# Patient Record
Sex: Male | Born: 1945 | Race: White | Hispanic: No
Health system: Southern US, Academic
[De-identification: ages and names within clinical notes are randomized; demographics above are authoritative.]

## PROBLEM LIST (undated history)

## (undated) ENCOUNTER — Ambulatory Visit

## (undated) ENCOUNTER — Ambulatory Visit: Payer: MEDICARE | Attending: Rheumatology | Primary: Rheumatology

## (undated) ENCOUNTER — Telehealth

## (undated) ENCOUNTER — Encounter

## (undated) ENCOUNTER — Encounter
Attending: Student in an Organized Health Care Education/Training Program | Primary: Student in an Organized Health Care Education/Training Program

## (undated) ENCOUNTER — Encounter: Attending: Dermatology | Primary: Dermatology

## (undated) ENCOUNTER — Ambulatory Visit: Payer: MEDICARE

## (undated) ENCOUNTER — Telehealth
Attending: Student in an Organized Health Care Education/Training Program | Primary: Student in an Organized Health Care Education/Training Program

## (undated) DIAGNOSIS — C4491 Basal cell carcinoma of skin, unspecified: Secondary | ICD-10-CM

## (undated) DIAGNOSIS — J45909 Unspecified asthma, uncomplicated: Secondary | ICD-10-CM

## (undated) DIAGNOSIS — K635 Polyp of colon: Secondary | ICD-10-CM

## (undated) DIAGNOSIS — R51 Headache: Secondary | ICD-10-CM

## (undated) DIAGNOSIS — M1990 Unspecified osteoarthritis, unspecified site: Secondary | ICD-10-CM

## (undated) DIAGNOSIS — R030 Elevated blood-pressure reading, without diagnosis of hypertension: Secondary | ICD-10-CM

## (undated) DIAGNOSIS — R112 Nausea with vomiting, unspecified: Secondary | ICD-10-CM

## (undated) DIAGNOSIS — B019 Varicella without complication: Secondary | ICD-10-CM

## (undated) DIAGNOSIS — Z9889 Other specified postprocedural states: Secondary | ICD-10-CM

## (undated) DIAGNOSIS — E7800 Pure hypercholesterolemia, unspecified: Secondary | ICD-10-CM

## (undated) DIAGNOSIS — K219 Gastro-esophageal reflux disease without esophagitis: Secondary | ICD-10-CM

## (undated) DIAGNOSIS — T7840XA Allergy, unspecified, initial encounter: Secondary | ICD-10-CM

## (undated) HISTORY — DX: Unspecified osteoarthritis, unspecified site: M19.90

## (undated) HISTORY — DX: Elevated blood-pressure reading, without diagnosis of hypertension: R03.0

## (undated) HISTORY — PX: KNEE ARTHROSCOPY: SHX127

## (undated) HISTORY — DX: Allergy, unspecified, initial encounter: T78.40XA

## (undated) HISTORY — DX: Unspecified asthma, uncomplicated: J45.909

## (undated) HISTORY — DX: Basal cell carcinoma of skin, unspecified: C44.91

## (undated) HISTORY — DX: Varicella without complication: B01.9

---
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
## 1970-01-13 HISTORY — PX: APPENDECTOMY: SHX54

## 2006-01-13 HISTORY — PX: LUMBAR DISC SURGERY: SHX700

## 2006-04-30 ENCOUNTER — Ambulatory Visit: Payer: Self-pay

## 2006-05-22 ENCOUNTER — Ambulatory Visit (HOSPITAL_COMMUNITY): Admission: RE | Admit: 2006-05-22 | Discharge: 2006-05-23 | Payer: Self-pay | Admitting: Neurosurgery

## 2007-03-31 ENCOUNTER — Ambulatory Visit: Payer: Self-pay | Admitting: Anesthesiology

## 2007-04-21 ENCOUNTER — Ambulatory Visit (HOSPITAL_BASED_OUTPATIENT_CLINIC_OR_DEPARTMENT_OTHER): Admission: RE | Admit: 2007-04-21 | Discharge: 2007-04-21 | Payer: Self-pay | Admitting: Orthopedic Surgery

## 2009-01-13 HISTORY — PX: COLONOSCOPY: SHX174

## 2009-07-22 ENCOUNTER — Ambulatory Visit: Payer: Self-pay | Admitting: Family Medicine

## 2010-02-13 NOTE — Assessment & Plan Note (Signed)
Summary: NECK AND SHOULDER PAIN/JBB   Vital Signs:  Patient Profile:   65 Years Old Male CC:      pain in neck and shoulder area Height:     70 inches Weight:      225 pounds BMI:     32.40 O2 Sat:      97 % O2 treatment:    Room Air Pulse rate:   64 / minute BP sitting:   108 / 60  (right arm)                  Current Allergies (reviewed today): No known allergies History of Present Illness History from: patient Reason for visit: see chief complaint Chief Complaint: pain in neck and shoulder area History of Present Illness: He has had pain in neck and shouldersx 2 days, and not sure why-just turned and somethimg seemed to pul, and hurt. No numbness or pain going down arm.    REVIEW OF SYSTEMS Constitutional Symptoms      Denies fever, chills, night sweats, weight loss, weight gain, and fatigue.  Eyes       Complains of glasses and contact lenses.      Denies change in vision, eye pain, eye discharge, and eye surgery. Ear/Nose/Throat/Mouth       Denies hearing loss/aids, change in hearing, ear pain, ear discharge, dizziness, frequent runny nose, frequent nose bleeds, sinus problems, sore throat, hoarseness, and tooth pain or bleeding.  Respiratory       Complains of dry cough.      Denies productive cough, wheezing, shortness of breath, asthma, bronchitis, and emphysema/COPD.  Cardiovascular       Denies murmurs, chest pain, and tires easily with exhertion.    Gastrointestinal       Denies stomach pain, nausea/vomiting, diarrhea, constipation, blood in bowel movements, and indigestion. Genitourniary       Denies painful urination, kidney stones, and loss of urinary control. Neurological       Denies paralysis, seizures, and fainting/blackouts. Musculoskeletal       Complains of muscle pain, joint pain, joint stiffness, and decreased range of motion.      Denies redness, swelling, muscle weakness, and gout.  Skin       Denies bruising, unusual mles/lumps or sores,  and hair/skin or nail changes.  Psych       Denies mood changes, temper/anger issues, anxiety/stress, speech problems, depression, and sleep problems.  Past History:  Past Medical History: Unremarkable  Past Surgical History: Back surgery- 2007 Knee surgery- 2007  Current Medications (verified): 1)  None  Allergies (verified): No Known Drug Allergies   Social History: 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 grown children Never Smoked Alcohol use- one beer a day Drug use-no Regular exercise-no Smoking Status:  never Drug Use:  no Does Patient Exercise:  no Physical Exam General appearance: well developed, well nourished, no acute distress Head: normocephalic, atraumatic Eyes: conjunctivae and lids normal Pupils: equal, round, reactive to light Ears: normal, no lesions or deformities Nasal: mucosa pink, nonedematous, no septal deviation, turbinates normal Neck: neck tender on right side, to deep palpation. Trapezius tight. Decreased ROM, esp to right and right lateral bending. Chest/Lungs: no rales, wheezes, or rhonchi bilateral, breath sounds equal without effort Heart: regular rate and  rhythm, no murmur Extremities: normal extremities Neurological: grossly intact and non-focal Skin: no obvious rashes or lesions MSE: oriented to time, place, and person Assessment New Problems: CERVICAL STRAIN (ICD-847.0)   Patient  Education: May also try gentle massage, may use Ben-Gay or similar rub. Extra caution with driving-gets up @ 1 am, to drive about 3 am.  Plan New Medications/Changes: FLEXERIL 10 MG TABS (CYCLOBENZAPRINE HCL) one tab by mouth every 8 hours as needed for spasm  #30 x 0, 07/22/2009, Adella Hare LPN NAPROXEN 413 MG TABS (NAPROXEN) one tab by mouth two times a day  #30 x 0, 07/22/2009, Adella Hare LPN  New Orders: New Patient Level III [id number]  The patient and/or caregiver has been counseled thoroughly with regard to medications  prescribed including dosage, schedule, interactions, rationale for use, and possible side effects and they verbalize understanding.  Diagnoses and expected course of recovery discussed and will return if not improved as expected or if the condition worsens. Patient and/or caregiver verbalized understanding.  Prescriptions: FLEXERIL 10 MG TABS (CYCLOBENZAPRINE HCL) one tab by mouth every 8 hours as needed for spasm  #30 x 0   Entered by:   Adella Hare LPN   Authorized by:   Kathrynn Running MD   Signed by:   Adella Hare LPN on 02/72/5366   Method used:   Electronically to        [facility]. [address] [phone]* (retail)       [address] [place], [place]  [address]       Ph: [phone]       Fax: [phone]   RxID:   [id number] NAPROXEN 500 MG TABS (NAPROXEN) one tab by mouth two times a day  #30 x 0   Entered by:   Adella Hare LPN   Authorized by:   Kathrynn Running MD   Signed by:   Adella Hare LPN on 25/42/7062   Method used:   Electronically to        [facility]. [address] [phone]* (retail)       [address] [place], [place]  [id number]       Ph: [phone]       Fax: [phone]   RxID:   [id number]   Orders Added: 1)  New Patient Level III [phone]   The risks, benefits and possible side effects were clearly explained and discussed with the patient.  The patient verbalized clear understanding.  The patient was given instructions to return if symptoms don't improve, worsen or new changes develop.  If it is not during clinic hours and the patient cannot get back to this clinic then the patient was told to seek medical care at an available urgent care or emergency department.  The patient verbalized understanding.    The patient was informed that there is no on-call provider or services available at this clinic during off-hours (when the clinic is closed).  If the patient developed a problem or concern that required immediate attention, the patient was  advised to go the the nearest available urgent care or emergency department for medical care.  The patient verbalized understanding.    It was clearly explained to the patient that this [facility] is not intended to be a primary care clinic.  The patient is always better served by the continuity of care and the provider/patient relationships developed with their dedicated primary care provider.  The patient was told to be sure to follow up as soon as possible with their primary care provider to discuss treatments received and to receive further examination and testing.  The patient verbalized understanding.  The will f/u with PCP ASAP.  I have reviewed the above medical office visit documention, including diagnoses, history, medications, clinical lists, orders and plan of care.   Rodney Langton, MD, FAAFP  July 22, 2009

## 2010-02-20 ENCOUNTER — Encounter: Payer: Self-pay | Admitting: Family Medicine

## 2010-02-20 ENCOUNTER — Ambulatory Visit: Payer: BC Managed Care – PPO | Admitting: Family Medicine

## 2010-02-20 DIAGNOSIS — J309 Allergic rhinitis, unspecified: Secondary | ICD-10-CM | POA: Insufficient documentation

## 2010-02-20 DIAGNOSIS — J019 Acute sinusitis, unspecified: Secondary | ICD-10-CM

## 2010-02-20 DIAGNOSIS — J45909 Unspecified asthma, uncomplicated: Secondary | ICD-10-CM | POA: Insufficient documentation

## 2010-02-28 NOTE — Assessment & Plan Note (Signed)
Summary: cold,cough,chills/jbb   Vital Signs:  Patient Profile:   65 Years Old Male CC:      Cold & URI symptoms Height:     69 inches Weight:      226 pounds BMI:     33.50 O2 Sat:      96 % O2 treatment:    Room Air Temp:     97.3 degrees F oral Pulse rate:   70 / minute Pulse rhythm:   regular Resp:     20 per minute BP sitting:   136 / 86  (right arm)  Pt. in pain?   no  Vitals Entered By: Levonne Spiller EMT-P (February 20, 2010 2:22 PM)              Is Patient Diabetic? No  Does patient need assistance? Functional Status Self care Ambulation Normal      Current Allergies (reviewed today): No known allergies History of Present Illness History from: patient Chief Complaint: Cold & URI symptoms History of Present Illness: The patient is presenting today reporting 3 weeks of cough, congestion, sinus drainage, and chills that started yesterday.  The patient has been wheezing on occasion and has a history of asthma.  The patient reports no fever.  The patient denies n/v/d.  Yellow sputum production reported.  Runny nose and sneezing has been persistent and cough worse at night.   REVIEW OF SYSTEMS Constitutional Symptoms       Complains of chills.     Denies fever, night sweats, weight loss, weight gain, and fatigue.  Eyes       Denies change in vision, eye pain, eye discharge, glasses, contact lenses, and eye surgery. Ear/Nose/Throat/Mouth       Denies hearing loss/aids, change in hearing, ear pain, ear discharge, dizziness, frequent runny nose, frequent nose bleeds, sinus problems, sore throat, hoarseness, and tooth pain or bleeding.  Respiratory       Complains of productive cough, wheezing, and asthma.      Denies dry cough, shortness of breath, bronchitis, and emphysema/COPD.      Comments: Yellow colored sputum Cardiovascular       Denies murmurs, chest pain, and tires easily with exhertion.    Gastrointestinal       Denies stomach pain, nausea/vomiting,  diarrhea, constipation, blood in bowel movements, and indigestion. Genitourniary       Denies painful urination, blood or discharge from penis, kidney stones, and loss of urinary control. Neurological       Denies paralysis, seizures, and fainting/blackouts. Musculoskeletal       Denies muscle pain, joint pain, joint stiffness, decreased range of motion, redness, swelling, muscle weakness, and gout.  Skin       Denies bruising, unusual mles/lumps or sores, and hair/skin or nail changes.  Psych       Denies mood changes, temper/anger issues, anxiety/stress, speech problems, depression, and sleep problems.  Past History:  Family History: Last updated: 02/20/2010 Father  - Heart Disease - Deceased age 61.  Social History: Last updated: 07/22/2009 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 grown children Never Smoked Alcohol use- one beer a day Drug use-no Regular exercise-no  Risk Factors: Exercise: no (07/22/2009)  Risk Factors: Smoking Status: never (07/22/2009)  Past Medical History: Asthma  Past Surgical History: Reviewed history from 07/22/2009 and no changes required. Back surgery- 2007 Knee surgery- 2007  Family History: Father  - Heart Disease - Deceased age 30.  Social History: Reviewed history from  07/22/2009 and no changes required. 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 grown children Never Smoked Alcohol use- one beer a day Drug use-no Regular exercise-no Physical Exam General appearance: well developed, well nourished, no acute distress Head: normocephalic, atraumatic Eyes: conjunctivae and lids normal Pupils: equal, round, reactive to light Ears: normal, no lesions or deformities Nasal: swollen red turbinates with congestion Oral/Pharynx: tongue normal, posterior pharynx without erythema or exudate Neck: neck supple,  trachea midline, no masses Chest/Lungs: no rales, wheezes, or rhonchi bilateral, breath sounds equal  without effort Heart: regular rate and  rhythm, no murmur Abdomen: soft, non-tender without obvious organomegaly Extremities: normal extremities Neurological: grossly intact and non-focal Skin: no obvious rashes or lesions MSE: oriented to time, place, and person Assessment Problems:   CERVICAL STRAIN (ICD-847.0)  Assessed ASTHMA as deteriorated - Clanford Johnson MD New Problems: ACUTE SINUSITIS, UNSPECIFIED (ICD-461.9) ALLERGIC RHINITIS CAUSE UNSPECIFIED (ICD-477.9) ASTHMA (ICD-493.90)   Patient Education: Patient and/or caregiver instructed in the following: rest, fluids. The risks, benefits and possible side effects were clearly explained and discussed with the patient.  The patient verbalized clear understanding.  The patient was given instructions to return if symptoms don't improve, worsen or new changes develop.  If it is not during clinic hours and the patient cannot get back to this clinic then the patient was told to seek medical care at an available urgent care or emergency department.  The patient verbalized understanding.   Demonstrates willingness to comply.  Plan New Medications/Changes: FLUTICASONE PROPIONATE 50 MCG/ACT SUSP (FLUTICASONE PROPIONATE) 2 sprays per nostril once daily  #1 x 0, 02/20/2010, Clanford Johnson MD AZITHROMYCIN 250 MG TABS (AZITHROMYCIN) take 2 tabs by mouth on day 1, then take 1 tab by mouth daily until completed  #6 x 0, 02/20/2010, Clanford Johnson MD CETIRIZINE HCL 10 MG TABS (CETIRIZINE HCL) take 1 by mouth daily  #30 x 0, 02/20/2010, Clanford Johnson MD  Follow Up: Follow up in 2-3 days if no improvement, Follow up on an as needed basis, Follow up with Primary Physician  The patient and/or caregiver has been counseled thoroughly with regard to medications prescribed including dosage, schedule, interactions, rationale for use, and possible side effects and they verbalize understanding.  Diagnoses and expected course of recovery discussed and  will return if not improved as expected or if the condition worsens. Patient and/or caregiver verbalized understanding.  Prescriptions: FLUTICASONE PROPIONATE 50 MCG/ACT SUSP (FLUTICASONE PROPIONATE) 2 sprays per nostril once daily  #1 x 0   Entered and Authorized by:   Standley Dakins MD   Signed by:   Standley Dakins MD on 02/20/2010   Method used:   Electronically to        [facility]. [address] [phone]* (retail)       [address] [place], [place]  [address]       Ph: [phone]       Fax: [phone]   RxID:   [id number] AZITHROMYCIN 250 MG TABS (AZITHROMYCIN) take 2 tabs by mouth on day 1, then take 1 tab by mouth daily until completed  #6 x 0   Entered and Authorized by:   Standley Dakins MD   Signed by:   Standley Dakins MD on 02/20/2010   Method used:   Electronically to        [facility]. [facility] [phone]* (retail)       [address]  [place], [place]  [address]       Ph: [phone]       Fax: [phone]   RxID:   [id number] CETIRIZINE HCL 10 MG TABS (CETIRIZINE HCL) take 1 by mouth daily  #30 x 0   Entered and Authorized by:   Standley Dakins MD   Signed by:   Standley Dakins MD on 02/20/2010   Method used:   Electronically to        [facility]. [address] [phone]* (retail)       [address] [place], [place]  [address]       Ph: [phone]       Fax: [phone]   RxID:   [id number]   Patient Instructions: 1)  Go to the pharmacy and pick up your prescription (s).  It may take up to 30 mins for electronic prescriptions to be delivered to the pharmacy.  Please call if your pharmacy has not received your prescriptions after 30 minutes.   2)  Return or go to the ER if no improvement or symptoms getting worse.   3)  Take your antibiotic as prescribed until ALL of it is gone, but stop if you develop a rash or swelling and contact our office as soon as possible. 4)  Acute sinusitis symptoms for less than 10 days are  not helped by antibiotics.Use warm moist compresses, and over the counter decongestants ( only as directed). Call if no improvement in 5-7 days, sooner if increasing pain, fever, or new symptoms. 5)  Acute bronchitis symptoms for less than 10 days are not helped by antibiotics. take over the counter cough medications. call if no improvment in  5-7 days, sooner if increasing cough, fever, or new symptoms( shortness of breath, chest pain). 6)  The patient was informed that there is no on-call provider or services available at this clinic during off-hours (when the clinic is closed).  If the patient developed a problem or concern that required immediate attention, the patient was advised to go the the nearest available urgent care or emergency department for medical care.  The patient verbalized understanding.

## 2010-03-20 ENCOUNTER — Ambulatory Visit (HOSPITAL_BASED_OUTPATIENT_CLINIC_OR_DEPARTMENT_OTHER)
Admission: RE | Admit: 2010-03-20 | Discharge: 2010-03-20 | Disposition: A | Discharge status: Home / Self Care | Payer: BC Managed Care – PPO | Source: Ambulatory Visit | Attending: Orthopedic Surgery | Admitting: Orthopedic Surgery

## 2010-03-20 DIAGNOSIS — M23319 Other meniscus derangements, anterior horn of medial meniscus, unspecified knee: Secondary | ICD-10-CM | POA: Insufficient documentation

## 2010-03-20 DIAGNOSIS — J45909 Unspecified asthma, uncomplicated: Secondary | ICD-10-CM | POA: Insufficient documentation

## 2010-03-20 DIAGNOSIS — M2240 Chondromalacia patellae, unspecified knee: Secondary | ICD-10-CM | POA: Insufficient documentation

## 2010-03-20 DIAGNOSIS — M23329 Other meniscus derangements, posterior horn of medial meniscus, unspecified knee: Secondary | ICD-10-CM | POA: Insufficient documentation

## 2010-03-20 DIAGNOSIS — M2390 Unspecified internal derangement of unspecified knee: Secondary | ICD-10-CM | POA: Insufficient documentation

## 2010-03-20 LAB — POCT HEMOGLOBIN-HEMACUE: Hemoglobin: 15.1 g/dL (ref 13.0–17.0)

## 2010-04-03 NOTE — Op Note (Signed)
NAME:  Nathan Ramirez NO.:  [id number]  MEDICAL RECORD NO.:  [id number]         PATIENT TYPE:  HAMB  LOCATION:                               FACILITY:  [facility]  PHYSICIAN:  Ollen Gross, M.D.    DATE OF BIRTH:  05-Aug-1945  DATE OF PROCEDURE:  03/20/2010 DATE OF DISCHARGE:                              OPERATIVE REPORT   PREOPERATIVE DIAGNOSIS:  Left knee medial meniscal tear and chondral defect.  POSTOPERATIVE DIAGNOSIS:  Left knee medial meniscal tear and chondral defect.  PROCEDURE:  Left knee arthroscopy with meniscal debridement and chondroplasty.  SURGEON:  Ollen Gross, M.D.  ASSISTANT:  None.  ANESTHESIA:  General.  ESTIMATED BLOOD LOSS:  Minimal.  DRAINS:  None.  COMPLICATIONS:  None.  CONDITION:  Stable to Recovery.  BRIEF CLINICAL NOTE:  Nathan Ramirez is 65 year old male with several-month history of current effusions and left knee pain mechanical symptoms.  He had one aspiration injection, but the fluid came right back.  An MRI showed meniscal tear and chondral defect.  He presents now for arthroscopic debridement given failure of nonoperative management.  PROCEDURE IN DETAIL:  After successful administration of general anesthetic, a tourniquet was placed high on his left thigh and his left lower extremity was prepped and draped in the usual sterile fashion. Standard superomedial and inferolateral incisions were made.  Inflow cannula was passed superomedial.  Camera was passed inferolateral. Arthroscopic visualization proceeds.  Undersurface of the patella and trochlea showed some grade 1 and 2 change, but no full-thickness defects and no evidence of unstable cartilage.  Mediolateral gutters were visualized.  There were no loose bodies.  Flexion and valgus force was applied to the knee and the medial compartment centered.  He has got a real significant tear at the body, posterior horn and small part of the anterior horn of the medial  meniscus.  There is also unstable cartilage undersurface of the femur with some exposed bone on the medial femoral condyle.  A spinal needle was used to localize the inferomedial portal with small incision made and dilator placed.  The meniscus was debrided back to a stable base with baskets and a 4.2 mm shaver and then sealed off with the ArthroCare device.  I used the shaver then to debride the unstable cartilage undersurface of the medial femoral condyle.  The lesions about 2 x 2 cm.  Once debrided down to bony base, I abraded the bone to trying to get some fibrocartilage formation.  The edges were also debrided to make them smooth edges.  The rest of the medial femoral condyle had some chondromalacia, but no unstable cartilage.  The medial tibial plateau had mild chondromalacia.  Intercondylar notch was visualized.  The ACL was normal.  Lateral compartment was entered and there is very mild chondromalacia in the lateral tibial plateau, but no unstable cartilage.  The central groove of the trochlea has some unstable cartilage, which was debrided back to a stable cartilaginous base with stable edges.  Joints were again inspected.  No other tears, loose bodies or defects were noted.  The arthroscopic equipment is then removed from the inferior  portals, which were closed with interrupted 4- 0 nylon.  A 20 cc of 0.25% Marcaine with epi injected through the inflow cannula and that is removed and that portal closed with nylon. Incisions were cleaned and dried.  A bulky sterile dressing was applied. He was then awakened and transported to Recovery in stable condition.     Ollen Gross, M.D.     FA/MEDQ  D:  03/20/2010  T:  03/20/2010  Job:  [id number]  Electronically Signed by Ollen Gross M.D. on 04/03/2010 08:17:31 AM

## 2010-05-28 NOTE — Op Note (Signed)
NAME:  Nathan Ramirez, Nathan Ramirez NO.:  [id number]   MEDICAL RECORD NO.:  [id number]          PATIENT TYPE:  AMB   LOCATION:  SDS                          FACILITY:  [facility]   PHYSICIAN:  Hilda Lias, M.D.   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   POSTOPERATIVE DIAGNOSES:  Left L3-L4 herniated disc with a free  fragment, lumbar stenosis, L3-L4 and L4-L5.   PROCEDURE:  Left L3-L4 laminotomy, discectomy, removal of fragment  compromising the L4 nerve root, microscope.   SURGEON:  Hilda Lias, M.D.   ASSISTANT:  Danae Orleans. Venetia Maxon, M.D.   CLINICAL HISTORY:  The patient was seen by me in the office of back and  left leg pain.  The patient failed conservative treatment including  epidural injection.  X-ray showed lumbar stenosis, bilateral, and  herniated disc with a fragment going to the left side.  Surgery was  advised and the risks were explained to him in the history and physical.   DESCRIPTION OF PROCEDURE:  Mr. More was taken to the OR where he was  positioned in a prone manner.  The wound was cleaned with DuraPrep.  A  midline incision from L3 to L4 was made.  X-rays showed that the level  was at the level of L3.  From then on we identified L3-L4 and with the  drill, we removed the lower lamina of L3 and the upper of L4.  A thick  yellow ligament was also excised.  With the microscope, we identified  the thecal sac.  Indeed, it was difficult to retract secondary to  adhesion but the nerve root was adherent to the disc.  An incision was  made and we entered the disc space which was almost completely empty.  The patient had quite a bit of degenerative disc.  There was a large  osteophyte which was removed.  From then on, we worked our way down to  the L4 nerve root  and there was a large disc with two fragments  compromising all the way down to the foramen.  Removal was achieved.  At  the end, we had good decompression of the thecal sac as well as the L3  and L4 nerve root.  Valsalva maneuver was negative.  Hemostasis was done  with the bipolar.  Depo-Medrol and fentanyl were left in the epidural  space and the wound was closed with Vicryl and Steri-Strips.           ______________________________  Hilda Lias, M.D.     EB/MEDQ  D:  05/22/2006  T:  05/23/2006  Job:  [id number]

## 2010-05-28 NOTE — Op Note (Signed)
NAME:  Nathan Ramirez, Nathan Ramirez NO.:  [id number]   MEDICAL RECORD NO.:  [id number]          PATIENT TYPE:  AMB   LOCATION:  [facility]                         FACILITY:  [facility]   PHYSICIAN:  Ollen Gross, M.D.    DATE OF BIRTH:  06-04-1945   DATE OF PROCEDURE:  04/21/2007  DATE OF DISCHARGE:                               OPERATIVE REPORT   PREOPERATIVE DIAGNOSIS:  Right knee medial meniscal tear and chondral  defect medial femoral condyle.   POSTOPERATIVE DIAGNOSIS:  Right knee medial meniscal tear and chondral  defect medial femoral condyle.   PROCEDURE:  Right knee arthroscopy with medial meniscal debridement and  chondroplasty.   SURGEON:  Ollen Gross, M.D.   ASSISTANT:  None.   ANESTHESIA:  General anesthesia.   ESTIMATED BLOOD LOSS:  Minimal.   DRAINS:  None.   COMPLICATIONS:  None.   CONDITION:  Stable to recovery room.   BRIEF CLINICAL NOTE:  Nathan Ramirez is a 65 year old male with a several  month history of significant right knee pain and mechanical symptoms.  Exam and history suggest a medial meniscal tear confirmed by MRI.  He  presents now for arthroscopy and debridement.   DESCRIPTION OF PROCEDURE:  After successful administration of general  anesthetic, a tourniquet is placed high on the right thigh and right  lower extremity prepped and draped in the usual sterile fashion.  Standard superomedial and inferolateral portal incisions made and inflow  cannula passed superomedial and camera passed inferolateral.  Arthroscopic visualization proceeds.  Undersurface of the patella looks  normal. The trochlea has some grade III change centrally but no full  thickness cartilage defect.  The medial and lateral gutters are  visualized.  There are no loose bodies.  Flexion and valgus force is  applied to the knee and the medial compartment is entered.  There is  evidence of a medial meniscal tear throughout the body and posterior  horn as well as a very large  chondral defect off of the medial femoral  condyle, posteromedial.  This is about 2 x 2 cm.  It looks like some of  it is old but there is also additional cartilage delaminating from the  bone.  A spinal needle was used to localize the inferomedial portals,  long incision made and dilator placed.  We then debrided the meniscus  back to a stable base with baskets and a 4.2 mm shaver and sealed it off  with the ArthroCare device.  The chondral defect on the medial femoral  condyle was debrided back to a bony base which is abraded and then  debrided back to stable cartilaginous edges.  Total size about 2 x 2 cm.  The rest of the condyle looks normal.  Medial tibial plateau looks  normal.  Intercondylar notch is visualized.  The ACL is normal.  Lateral  compartment is entered and is normal.  I then addressed the  patellofemoral compartment again, debrided that small trochlear chondral  defect back to a stable cartilaginous base.  The joint is further  inspected and there are no other loose bodies, tears or  defects noted.  The arthroscopic equipment is then removed from the inferior portals  which are closed with interrupted 4-0 nylon.  There are 20 mL of 25%  Marcaine with epinephrine injected through the inflow cannula then that  is removed and that portal closed with nylon.  The incisions are cleaned  and dried and a bulky sterile dressing applied.  He is then awakened and  transported to recovery in stable condition.      Ollen Gross, M.D.  Electronically Signed     FA/MEDQ  D:  04/21/2007  T:  04/21/2007  Job:  [id number]

## 2010-10-08 LAB — POCT HEMOGLOBIN-HEMACUE
Hemoglobin: 16.2
Operator id: 268271

## 2010-10-15 LAB — HM COLONOSCOPY: HM Colonoscopy: NORMAL

## 2011-10-15 ENCOUNTER — Encounter: Payer: Self-pay | Admitting: Internal Medicine

## 2011-10-15 ENCOUNTER — Ambulatory Visit (INDEPENDENT_AMBULATORY_CARE_PROVIDER_SITE_OTHER): Payer: BC Managed Care – PPO | Admitting: Internal Medicine

## 2011-10-15 VITALS — BP 122/72 | HR 62 | Temp 97.8°F | Ht 70.0 in | Wt 225.0 lb

## 2011-10-15 DIAGNOSIS — I10 Essential (primary) hypertension: Secondary | ICD-10-CM

## 2011-10-15 DIAGNOSIS — R5381 Other malaise: Secondary | ICD-10-CM

## 2011-10-15 DIAGNOSIS — R5383 Other fatigue: Secondary | ICD-10-CM

## 2011-10-15 DIAGNOSIS — R002 Palpitations: Secondary | ICD-10-CM

## 2011-10-15 DIAGNOSIS — Z125 Encounter for screening for malignant neoplasm of prostate: Secondary | ICD-10-CM

## 2011-10-15 DIAGNOSIS — Z13220 Encounter for screening for lipoid disorders: Secondary | ICD-10-CM

## 2011-10-15 LAB — LIPID PANEL
Cholesterol: 234 mg/dL — ABNORMAL HIGH (ref 0–200)
HDL: 50.2 mg/dL (ref >39.00)
Total CHOL/HDL Ratio: 5
Triglycerides: 86 mg/dL (ref 0.0–149.0)
VLDL: 17.2 mg/dL (ref 0.0–40.0)

## 2011-10-15 LAB — COMPREHENSIVE METABOLIC PANEL
ALT: 48 U/L (ref 0–53)
AST: 34 U/L (ref 0–37)
Albumin: 4.3 g/dL (ref 3.5–5.2)
Alkaline Phosphatase: 60 U/L (ref 39–117)
BUN: 28 mg/dL — ABNORMAL HIGH (ref 6–23)
CO2: 24 mEq/L (ref 19–32)
Calcium: 9.4 mg/dL (ref 8.4–10.5)
Chloride: 106 mEq/L (ref 96–112)
Creatinine, Ser: 1.1 mg/dL (ref 0.4–1.5)
GFR: 73.93 mL/min (ref >60.00)
Glucose, Bld: 112 mg/dL — ABNORMAL HIGH (ref 70–99)
Potassium: 4.5 mEq/L (ref 3.5–5.1)
Sodium: 137 mEq/L (ref 135–145)
Total Bilirubin: 0.8 mg/dL (ref 0.3–1.2)
Total Protein: 7.1 g/dL (ref 6.0–8.3)

## 2011-10-15 LAB — TSH: TSH: 1.12 u[IU]/mL (ref 0.35–5.50)

## 2011-10-15 LAB — LDL CHOLESTEROL, DIRECT: Direct LDL: 161.8 mg/dL

## 2011-10-15 LAB — PSA: PSA: 0.44 ng/mL (ref 0.10–4.00)

## 2011-10-15 MED ORDER — AMLODIPINE BESYLATE 2.5 MG PO TABS: 2.5000 mg | ORAL_TABLET | Freq: Every day | ORAL | Status: DC

## 2011-10-15 MED ORDER — TRAMADOL HCL 50 MG PO TABS: 50.0000 mg | ORAL_TABLET | Freq: Four times a day (QID) | ORAL | Status: DC | PRN

## 2011-10-15 NOTE — Assessment & Plan Note (Signed)
New onset,  With normal EKG.  Etiology may be NSAID induced ,  Suspend diclofenac,  Trial of amlodipine 2.5 mg prn elevations.

## 2011-10-15 NOTE — Progress Notes (Signed)
Patient ID: Nathan Ramirez, male   DOB: [birth year], 66 y.o.   MRN: [id number]   Patient Active Problem List  Diagnosis  . ALLERGIC RHINITIS CAUSE UNSPECIFIED  . ASTHMA  . Hypertension    Subjective:  CC:   Chief Complaint  Patient presents with  . Establish Care    HPI:   Nathan Ramirez a 66 y.o. male who presents  With new onset hypertension accompanied by dizziness without vertigo. Symptoms started last Wednesday after receiving the flu shot one day prior.  He has no history of adverse reaction to flu vaccines but his left arm has been achey since the shot was given by a Charity fundraiser at the plant. 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  His wife has been checking his bp and has recorded diastolic pressures as high as 629. He denies changes in vision and headaches.  Works at [employer].  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   No accidents but lots of heavy lifting. Carma Lair is his orthopedist and has done arthroscopy on both knees,  As well as a lumbar diskectomy .    Past Medical History  Diagnosis Date  . Asthma   . Arthritis   . Chicken pox   . Allergy   . Elevated blood pressure reading     Past Surgical History  Procedure Date  . Lumbar disc surgery 2008  . Appendectomy 1972  . Knee arthroscopy 2009& 2010    RIGHT AND LEFT KNEE  . Spine surgery     lumbar diskectomy         The following portions of the patient's history were reviewed and updated as appropriate: Allergies, current medications, and problem list.    Review of Systems:   12 Pt  review of systems was negative except those addressed in the HPI,     History   Social History  . Marital Status: Married    Spouse Name: N/A    Number of Children: N/A  . Years of Education: N/A   Occupational History  . Not on file.   Social History Main Topics  . Smoking status: Former Games developer  . Smokeless tobacco: Not on file  .  Alcohol Use: Yes     OCCASSIONALLY  . Drug Use: No  . Sexually Active: Not on file   Other Topics Concern  . Not on file   Social History Narrative  . No narrative on file    Objective:  BP 122/72  Pulse 62  Temp 97.8 F (36.6 C) (Oral)  Ht 5\' 10"  (1.778 m)  Wt 225 lb (102.059 kg)  BMI 32.28 kg/m2  SpO2 95%  General appearance: alert, cooperative and appears stated age Ears: normal TM's and external ear canals both ears Throat: lips, mucosa, and tongue normal; teeth and gums normal Neck: no adenopathy, no carotid bruit, supple, symmetrical, trachea midline and thyroid not enlarged, symmetric, no tenderness/mass/nodules Back: symmetric, no curvature. ROM normal. No CVA tenderness. Lungs: clear to auscultation bilaterally Heart: regular rate and rhythm, S1, S2 normal, no murmur, click, rub or gallop Abdomen: soft, non-tender; bowel sounds normal; no masses,  no organomegaly Pulses: 2+ and symmetric Skin: Skin color, texture, turgor normal. No rashes or lesions Lymph nodes: Cervical, supraclavicular, and axillary nodes normal.  Assessment and Plan:  Hypertension New onset,  With normal EKG.  Etiology may be NSAID induced ,  Suspend diclofenac,  Trial of  amlodipine 2.5 mg prn elevations.    Updated Medication List Outpatient Encounter Prescriptions as of 10/15/2011  Medication Sig Dispense Refill  . cholecalciferol (VITAMIN D) 1000 UNITS tablet Take 1,000 Units by mouth daily.      . Cyanocobalamin (VITAMIN B-12) 2500 MCG SUBL Place 2,500 mcg under the tongue.      . diclofenac (VOLTAREN) 75 MG EC tablet Take 75 mg by mouth 2 (two) times daily.      . Fluticasone-Salmeterol (ADVAIR) 100-50 MCG/DOSE AEPB Inhale 1 puff into the lungs 2 (two) times daily.      Marland Kitchen loratadine (CLARITIN) 10 MG tablet Take 10 mg by mouth daily.      . meclizine (ANTIVERT) 25 MG tablet Take 25 mg by mouth 3 (three) times daily as needed.      . Multiple Vitamin (MULTIVITAMIN) capsule Take 1  capsule by mouth daily.      Marland Kitchen omeprazole (PRILOSEC) 20 MG capsule Take 20 mg by mouth daily.      Marland Kitchen amLODipine (NORVASC) 2.5 MG tablet Take 1 tablet (2.5 mg total) by mouth daily.  30 tablet  1  . traMADol (ULTRAM) 50 MG tablet Take 1 tablet (50 mg total) by mouth every 6 (six) hours as needed for pain.  90 tablet  1     Orders Placed This Encounter  Procedures  . Lipid panel  . Comprehensive metabolic panel  . TSH  . PSA  . LDL cholesterol, direct  . EKG 12-Lead  . HM COLONOSCOPY    Return in about 1 day (around 10/16/2011).

## 2011-10-15 NOTE — Patient Instructions (Addendum)
We ordered blood tests today and an EKG.   Please stop the diclofenac for a few weeks and continue to check your blood pressure once daily (only more if your dizziness returns)  Start the amlodipine if your blood pressure stays above 140/80

## 2011-10-17 ENCOUNTER — Telehealth: Payer: Self-pay | Admitting: Internal Medicine

## 2011-10-17 NOTE — Progress Notes (Signed)
Quick Note:  Patients wife informed and voiced understanding. Pts wife will relate message ______

## 2011-10-17 NOTE — Telephone Encounter (Signed)
Patient's wife wants someone to go over the patient's lab work with her.

## 2011-10-20 NOTE — Telephone Encounter (Signed)
I called pt's wife- she had questions about his labs that she had forgotten whatt the nurse had told her previously. I answered her questions. She understands.

## 2011-11-17 ENCOUNTER — Ambulatory Visit (INDEPENDENT_AMBULATORY_CARE_PROVIDER_SITE_OTHER): Payer: BC Managed Care – PPO | Admitting: Internal Medicine

## 2011-11-17 ENCOUNTER — Encounter: Payer: Self-pay | Admitting: Internal Medicine

## 2011-11-17 VITALS — BP 128/80 | HR 65 | Temp 97.7°F | Resp 12 | Ht 70.0 in | Wt 216.0 lb

## 2011-11-17 DIAGNOSIS — E785 Hyperlipidemia, unspecified: Secondary | ICD-10-CM | POA: Insufficient documentation

## 2011-11-17 DIAGNOSIS — I10 Essential (primary) hypertension: Secondary | ICD-10-CM

## 2011-11-17 DIAGNOSIS — E669 Obesity, unspecified: Secondary | ICD-10-CM

## 2011-11-17 MED ORDER — OMEPRAZOLE 20 MG PO CPDR: 20.0000 mg | DELAYED_RELEASE_CAPSULE | Freq: Every day | ORAL | Status: DC

## 2011-11-17 MED ORDER — AMLODIPINE BESYLATE 2.5 MG PO TABS: 2.5000 mg | ORAL_TABLET | Freq: Every day | ORAL | Status: DC

## 2011-11-17 MED ORDER — LORATADINE 10 MG PO TABS: 10.0000 mg | ORAL_TABLET | Freq: Every day | ORAL | Status: DC

## 2011-11-17 MED ORDER — FLUTICASONE-SALMETEROL 100-50 MCG/DOSE IN AEPB: 1.0000 | INHALATION_SPRAY | Freq: Two times a day (BID) | RESPIRATORY_TRACT | Status: DC

## 2011-11-17 MED ORDER — TRAMADOL HCL 50 MG PO TABS: 50.0000 mg | ORAL_TABLET | Freq: Three times a day (TID) | ORAL | Status: DC | PRN

## 2011-11-17 NOTE — Assessment & Plan Note (Signed)
Continue amlodipine at 2.5 mg daily.  Will adjust medication if needed for goal 130/80

## 2011-11-17 NOTE — Patient Instructions (Addendum)
Continue the amlodipine daily. At 2.5 mg   Check your blood pressure a few times over the next few weeks and e mail me the results.  Return in 3 months for repeat fasting labs and OV   Return on Friday for your TdAP

## 2011-11-17 NOTE — Assessment & Plan Note (Signed)
Improved, with 14 lb wt loss in one month. Continue current plan.

## 2011-11-17 NOTE — Progress Notes (Signed)
Patient ID: Nathan Ramirez, male   DOB: October 07, 1945, 66 y.o.   MRN: [id number]  Patient Active Problem List  Diagnosis  . ALLERGIC RHINITIS CAUSE UNSPECIFIED  . ASTHMA  . Hypertension  . Hyperlipidemia LDL goal <100    Subjective:  CC:   Chief Complaint  Patient presents with  . Follow-up    HPI:   Nathan Ramirez a 66 y.o. male who presents Follow up on hypertension, hyperlipidemia and obesity .  Has lost 14 lbs bu ctutting out junk food and increasing fruits.  He is now exercising daily.  He has stopped diclofenac and is taking tramadol instead for arthritis.  His pain is swell controlled. His blood pressure remains elevated.    Past Medical History  Diagnosis Date  . Asthma   . Arthritis   . Chicken pox   . Allergy   . Elevated blood pressure reading     Past Surgical History  Procedure Date  . Lumbar disc surgery 2008  . Appendectomy 1972  . Knee arthroscopy 2009& 2010    RIGHT AND LEFT KNEE  . Spine surgery     lumbar diskectomy         The following portions of the patient's history were reviewed and updated as appropriate: Allergies, current medications, and problem list.    Review of Systems:   12 Pt  review of systems was negative except those addressed in the HPI,     History   Social History  . Marital Status: Married    Spouse Name: N/A    Number of Children: N/A  . Years of Education: N/A   Occupational History  . Not on file.   Social History Main Topics  . Smoking status: Former Games developer  . Smokeless tobacco: Not on file  . Alcohol Use: Yes     Comment: OCCASSIONALLY  . Drug Use: No  . Sexually Active: Not on file   Other Topics Concern  . Not on file   Social History Narrative  . No narrative on file    Objective:  BP 128/80  Pulse 65  Temp 97.7 F (36.5 C) (Oral)  Resp 12  Ht 5\' 10"  (1.778 m)  Wt 216 lb (97.977 kg)  BMI 30.99 kg/m2  SpO2 95%  General appearance: alert, cooperative and appears stated  age Ears: normal TM's and external ear canals both ears Throat: lips, mucosa, and tongue normal; teeth and gums normal Neck: no adenopathy, no carotid bruit, supple, symmetrical, trachea midline and thyroid not enlarged, symmetric, no tenderness/mass/nodules Back: symmetric, no curvature. ROM normal. No CVA tenderness. Lungs: clear to auscultation bilaterally Heart: regular rate and rhythm, S1, S2 normal, no murmur, click, rub or gallop Abdomen: soft, non-tender; bowel sounds normal; no masses,  no organomegaly Pulses: 2+ and symmetric Skin: Skin color, texture, turgor normal. No rashes or lesions Lymph nodes: Cervical, supraclavicular, and axillary nodes normal.  Assessment and Plan:  Hyperlipidemia LDL goal <100 LDL was 160.  Willl repeat 3 more months of diet and exercise.   Hypertension Continue amlodipine at 2.5 mg daily.  Will adjust medication if needed for goal 130/80  Obesity (BMI 30.0-34.9) Improved, with 14 lb wt loss in one month. Continue current plan.   Updated Medication List Outpatient Encounter Prescriptions as of 11/17/2011  Medication Sig Dispense Refill  . cholecalciferol (VITAMIN D) 1000 UNITS tablet Take 1,000 Units by mouth daily.      . Cyanocobalamin (VITAMIN B-12) 2500 MCG SUBL Place 2,500 mcg under  the tongue.      Marland Kitchen Fluticasone-Salmeterol (ADVAIR) 100-50 MCG/DOSE AEPB Inhale 1 puff into the lungs 2 (two) times daily.  180 each  3  . loratadine (CLARITIN) 10 MG tablet Take 1 tablet (10 mg total) by mouth daily.  90 tablet  3  . Multiple Vitamin (MULTIVITAMIN) capsule Take 1 capsule by mouth daily.      . traMADol (ULTRAM) 50 MG tablet Take 1 tablet (50 mg total) by mouth every 8 (eight) hours as needed for pain.  270 tablet  3  . [DISCONTINUED] Fluticasone-Salmeterol (ADVAIR) 100-50 MCG/DOSE AEPB Inhale 1 puff into the lungs 2 (two) times daily.      . [DISCONTINUED] loratadine (CLARITIN) 10 MG tablet Take 10 mg by mouth daily.      . [DISCONTINUED]  traMADol (ULTRAM) 50 MG tablet Take 1 tablet (50 mg total) by mouth every 6 (six) hours as needed for pain.  90 tablet  1  . amLODipine (NORVASC) 2.5 MG tablet Take 1 tablet (2.5 mg total) by mouth daily.  90 tablet  1  . meclizine (ANTIVERT) 25 MG tablet Take 25 mg by mouth 3 (three) times daily as needed.      Marland Kitchen omeprazole (PRILOSEC) 20 MG capsule Take 1 capsule (20 mg total) by mouth daily.  90 capsule  3  . [DISCONTINUED] amLODipine (NORVASC) 2.5 MG tablet Take 1 tablet (2.5 mg total) by mouth daily.  30 tablet  1  . [DISCONTINUED] diclofenac (VOLTAREN) 75 MG EC tablet Take 75 mg by mouth 2 (two) times daily.      . [DISCONTINUED] omeprazole (PRILOSEC) 20 MG capsule Take 20 mg by mouth daily.

## 2011-11-17 NOTE — Assessment & Plan Note (Signed)
LDL was 160.  Willl repeat 3 more months of diet and exercise.

## 2011-12-23 ENCOUNTER — Telehealth: Payer: Self-pay | Admitting: Internal Medicine

## 2011-12-23 DIAGNOSIS — R112 Nausea with vomiting, unspecified: Secondary | ICD-10-CM

## 2011-12-23 MED ORDER — PROMETHAZINE HCL 25 MG RE SUPP: 25.0000 mg | Freq: Four times a day (QID) | RECTAL | Status: DC | PRN

## 2011-12-23 NOTE — Telephone Encounter (Signed)
Phenergan suppository called to [facility] [place]

## 2011-12-23 NOTE — Telephone Encounter (Signed)
Patient Information:  Caller Name: [place]  Phone: [phone]  Patient: Nathan Ramirez, Nathan Ramirez  Gender: Male  DOB: 19-Nov-1945  Age: 66 Years  PCP: Duncan Dull (Adults only)   Symptoms  Reason For Call & Symptoms: vomiting, no diarrhea; had two dental implants placed lower jaw 12/17/11.  Seen by oral surgeon again 12/21/11 and started antibiotic and vicodin.  Reviewed Health History In EMR: Yes  Reviewed Medications In EMR: Yes  Reviewed Allergies In EMR: Yes  Reviewed Surgeries / Procedures: Yes  Date of Onset of Symptoms: 12/21/2011  Guideline(s) Used:  Vomiting  Disposition Per Guideline:   Callback by PCP Today  Reason For Disposition Reached:   Vomiting a prescription medication  Advice Given:  N/A  Office Follow Up:  Does the office need to follow up with this patient?: Yes  Instructions For The Office: medication for n/v suppository if possible until can be seen in AM 12/24/11  krs/can  RN Note:  Patient has appt in AM 12/24/11 but is vomiting antibiotic and pain medication after dental implants placed 12/17/11.  States unable to keep solids down.  Would like suppository medication to help with the nausea and vomiting.  States oral surgeon's office prescribed antibiotic, pain medication, but advised patient to call PCP regarding nausea/vomiting.  Triaged; emergent symptoms denied.  Info to office for provider review/Rx/callback.  [facility]

## 2011-12-23 NOTE — Telephone Encounter (Signed)
Spoke to patient advised him that [facility].

## 2012-05-03 ENCOUNTER — Telehealth: Payer: Self-pay | Admitting: Internal Medicine

## 2012-05-03 MED ORDER — TRAMADOL HCL 50 MG PO TABS: 100.0000 mg | ORAL_TABLET | Freq: Three times a day (TID) | ORAL | Status: DC | PRN

## 2012-05-03 NOTE — Telephone Encounter (Signed)
New tramadol rx for #6/daily or #180/month sent to pharmacy

## 2012-05-03 NOTE — Telephone Encounter (Signed)
Patient notified script at pharmacy

## 2012-05-03 NOTE — Telephone Encounter (Signed)
Wife called and states patient is completely out of his tramdol, she said that when he saw Dr. Darrick Huntsman last she told him that if one pill didn't work he could take two.  She states that is what he is doing and his refill isn't until May 20 and would like a new prescription with either a higher dose or a different quantity so he doesn't run out.  Please advise he uses [facility] in [place].

## 2012-06-08 ENCOUNTER — Other Ambulatory Visit: Payer: Self-pay | Admitting: Internal Medicine

## 2012-06-15 ENCOUNTER — Ambulatory Visit (INDEPENDENT_AMBULATORY_CARE_PROVIDER_SITE_OTHER): Payer: Self-pay | Admitting: Adult Health

## 2012-06-15 ENCOUNTER — Encounter: Payer: Self-pay | Admitting: Adult Health

## 2012-06-15 VITALS — BP 106/68 | HR 77 | Temp 97.9°F | Resp 12 | Ht 69.75 in | Wt 209.5 lb

## 2012-06-15 DIAGNOSIS — Z0000 Encounter for general adult medical examination without abnormal findings: Secondary | ICD-10-CM

## 2012-06-15 LAB — POCT URINALYSIS DIPSTICK
Bilirubin, UA: NEGATIVE
Blood, UA: NEGATIVE
Glucose, UA: NEGATIVE
Ketones, UA: NEGATIVE
Leukocytes, UA: NEGATIVE
Nitrite, UA: NEGATIVE
Protein, UA: NEGATIVE
Spec Grav, UA: 1.01
Urobilinogen, UA: 0.2
pH, UA: 6.5
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
## 2012-06-15 NOTE — Assessment & Plan Note (Addendum)
DOT physical. Normal exam. Vision normal. Hearing normal. Urinalysis normal without glucose, blood, protein. Patient without any limitation. Forms completed and returned to patient. Note, greater than 45 min spent in face to face communication and in the completion of DOT form.

## 2012-07-29 ENCOUNTER — Other Ambulatory Visit: Payer: Self-pay | Admitting: Internal Medicine

## 2012-07-29 NOTE — Telephone Encounter (Signed)
Pt spouse came in today stating mr Byron is now getting his rx through the [place] and he needs refill on  Tramadol 50 Amlodipine besylate 2.5 advair 100/50 90 day supply  Please send rx to  Janace Hoard RN [facility] Direct line [phone] ext [phone] Fax # [phone] Call center [phone] Please advise pt when rx called in

## 2012-07-30 MED ORDER — TRAMADOL HCL 50 MG PO TABS: 100.0000 mg | ORAL_TABLET | Freq: Three times a day (TID) | ORAL | Status: DC | PRN

## 2012-07-30 MED ORDER — AMLODIPINE BESYLATE 2.5 MG PO TABS: ORAL_TABLET | ORAL | Status: DC

## 2012-07-30 MED ORDER — FLUTICASONE-SALMETEROL 100-50 MCG/DOSE IN AEPB: 1.0000 | INHALATION_SPRAY | Freq: Two times a day (BID) | RESPIRATORY_TRACT | Status: DC | PRN

## 2012-07-30 NOTE — Telephone Encounter (Signed)
Can I do a ninety day supply on tramadol ? I have set these in Epic to print if you approve so I can fax to [facility] for patient .

## 2012-07-30 NOTE — Telephone Encounter (Signed)
Absolutely,  Thank you for doing that ,  i have printed

## 2012-08-02 NOTE — Telephone Encounter (Signed)
Script faxed as requested.

## 2012-09-14 DIAGNOSIS — M1710 Unilateral primary osteoarthritis, unspecified knee: Secondary | ICD-10-CM | POA: Diagnosis not present

## 2012-09-14 DIAGNOSIS — IMO0002 Reserved for concepts with insufficient information to code with codable children: Secondary | ICD-10-CM | POA: Diagnosis not present

## 2012-09-15 ENCOUNTER — Encounter: Payer: Self-pay | Admitting: *Deleted

## 2012-09-16 ENCOUNTER — Encounter: Payer: Self-pay | Admitting: Internal Medicine

## 2012-09-16 ENCOUNTER — Ambulatory Visit (INDEPENDENT_AMBULATORY_CARE_PROVIDER_SITE_OTHER): Payer: Medicare Other | Admitting: Internal Medicine

## 2012-09-16 VITALS — BP 108/72 | HR 94 | Temp 98.2°F | Resp 14 | Ht 69.75 in | Wt 215.0 lb

## 2012-09-16 DIAGNOSIS — K219 Gastro-esophageal reflux disease without esophagitis: Secondary | ICD-10-CM

## 2012-09-16 DIAGNOSIS — E669 Obesity, unspecified: Secondary | ICD-10-CM

## 2012-09-16 DIAGNOSIS — R197 Diarrhea, unspecified: Secondary | ICD-10-CM

## 2012-09-16 DIAGNOSIS — E66811 Obesity, class 1: Secondary | ICD-10-CM

## 2012-09-16 LAB — COMPREHENSIVE METABOLIC PANEL
ALT: 34 U/L (ref 0–53)
AST: 36 U/L (ref 0–37)
Albumin: 4.1 g/dL (ref 3.5–5.2)
Alkaline Phosphatase: 48 U/L (ref 39–117)
BUN: 14 mg/dL (ref 6–23)
CO2: 28 mEq/L (ref 19–32)
Calcium: 9.3 mg/dL (ref 8.4–10.5)
Chloride: 104 mEq/L (ref 96–112)
Creatinine, Ser: 0.9 mg/dL (ref 0.4–1.5)
GFR: 92.37 mL/min (ref >60.00)
Glucose, Bld: 92 mg/dL (ref 70–99)
Potassium: 4.1 mEq/L (ref 3.5–5.1)
Sodium: 139 mEq/L (ref 135–145)
Total Bilirubin: 0.9 mg/dL (ref 0.3–1.2)
Total Protein: 6.5 g/dL (ref 6.0–8.3)

## 2012-09-16 LAB — C-REACTIVE PROTEIN: CRP: <0.5 mg/dL (ref 0.5–20.0)

## 2012-09-16 LAB — MAGNESIUM: Magnesium: 1.9 mg/dL (ref 1.5–2.5)

## 2012-09-16 MED ORDER — DICYCLOMINE HCL 10 MG PO CAPS: 10.0000 mg | ORAL_CAPSULE | Freq: Three times a day (TID) | ORAL | Status: DC

## 2012-09-16 MED ORDER — DEXLANSOPRAZOLE 60 MG PO CPDR: 60.0000 mg | DELAYED_RELEASE_CAPSULE | Freq: Every day | ORAL | Status: DC

## 2012-09-16 NOTE — Patient Instructions (Addendum)
For your diarrhea:   Stool studies to rule out infection Labs to rule out celiac disease Trial of bentyl for IBS.  Take one tablet 30 minutes prior to each meal  For your reflux:  Substitute Dexilant,  One tablet daily instead of omeprazole .  If the symptoms resolve,  You can return to the omeprazole and try taking it in the morning 30 minutes prior to food

## 2012-09-16 NOTE — Progress Notes (Signed)
Patient ID: Nathan Ramirez, male   DOB: 1945/07/05, 67 y.o.   MRN: [id number]  Patient Active Problem List   Diagnosis Date Noted  . Diarrhea 09/16/2012  . Physical exam 06/15/2012  . Hyperlipidemia LDL goal <100 11/17/2011  . Obesity (BMI 30.0-34.9) 11/17/2011  . Hypertension 10/15/2011  . ALLERGIC RHINITIS CAUSE UNSPECIFIED 02/20/2010  . ASTHMA 02/20/2010    Subjective:  CC:   Chief Complaint  Patient presents with  . Follow-up    Reflux, irratible bowel issue,s, staed by patient.    HPI:   Nathan Ramirez a 67 y.o. male who presents Followup on multiple acute and chronic issues.  1) viral illness respiratory symptoms are now improving. He denies fevers facial pain and bloody nasal discharge.  2) 3 month history of post prandial watery stools with tenesmus,  Occasional cramping.   No bleeding,  No wt loss  Sister has IBS   3) daily reflux symptoms despite using over-the-counter Prilosec. He has tried cutting out coffee and spicy food but this is made no difference in his symptoms.     Past Medical History  Diagnosis Date  . Asthma   . Arthritis   . Chicken pox   . Allergy   . Elevated blood pressure reading     Past Surgical History  Procedure Laterality Date  . Lumbar disc surgery  2008  . Appendectomy  1972  . Knee arthroscopy  2009& 2010    RIGHT AND LEFT KNEE  . Spine surgery      lumbar diskectomy       The following portions of the patient's history were reviewed and updated as appropriate: Allergies, current medications, and problem list.    Review of Systems:   12 Pt  review of systems was negative except those addressed in the HPI,     History   Social History  . Marital Status: Married    Spouse Name: N/A    Number of Children: N/A  . Years of Education: N/A   Occupational History  . Not on file.   Social History Main Topics  . Smoking status: Former Games developer  . Smokeless tobacco: Not on file  . Alcohol Use: Yes   Comment: OCCASSIONALLY  . Drug Use: No  . Sexual Activity: Not on file   Other Topics Concern  . Not on file   Social History Narrative  . No narrative on file    Objective:  Filed Vitals:   09/16/12 1142  BP: 108/72  Pulse: 94  Temp: 98.2 F (36.8 C)  Resp: 14     General appearance: alert, cooperative and appears stated age Ears: normal TM's and external ear canals both ears Throat: lips, mucosa, and tongue normal; teeth and gums normal Neck: no adenopathy, no carotid bruit, supple, symmetrical, trachea midline and thyroid not enlarged, symmetric, no tenderness/mass/nodules Back: symmetric, no curvature. ROM normal. No CVA tenderness. Lungs: clear to auscultation bilaterally Heart: regular rate and rhythm, S1, S2 normal, no murmur, click, rub or gallop Abdomen: soft, non-tender; bowel sounds normal; no masses,  no organomegaly Pulses: 2+ and symmetric Skin: Skin color, texture, turgor normal. No rashes or lesions Lymph nodes: Cervical, supraclavicular, and axillary nodes normal.  Assessment and Plan:  Diarrhea I suspect this is irritable bowel but will consider lactose intolerance, probably second is, or infectious although less likely given his lack of weight loss and fevers. Stool studies pending. Omeprazole discontinued. Dicyclomine trial initiated.   Obesity (BMI 30.0-34.9) I have addressed  BMI and recommended wt loss of 10% of body weigh over the next 6 months using a low glycemic index diet and regular exercise a minimum of 5 days per week.    GERD (gastroesophageal reflux disease) Symptoms have not improved with over-the-counter omeprazole. This may be ilantausing his diarrhea. Trial of dexilant,x 3 weeks, with samples given.   Updated Medication List Outpatient Encounter Prescriptions as of 09/16/2012  Medication Sig Dispense Refill  . Fluticasone-Salmeterol (ADVAIR) 100-50 MCG/DOSE AEPB Inhale 1 puff into the lungs 2 (two) times daily as needed.  60 each   2  . loratadine (CLARITIN) 10 MG tablet Take 10 mg by mouth daily as needed.      . traMADol (ULTRAM) 50 MG tablet Take 2 tablets (100 mg total) by mouth every 8 (eight) hours as needed for pain. Maximum 6 tablets daily  180 tablet  5  . [DISCONTINUED] omeprazole (PRILOSEC) 20 MG capsule Take 1 capsule (20 mg total) by mouth daily.  90 capsule  3  . amLODipine (NORVASC) 2.5 MG tablet TAKE ONE TABLET BY MOUTH EVERY DAY  90 tablet  2  . dexlansoprazole (DEXILANT) 60 MG capsule Take 1 capsule (60 mg total) by mouth daily.  15 capsule  0  . dicyclomine (BENTYL) 10 MG capsule Take 1 capsule (10 mg total) by mouth 4 (four) times daily -  before meals and at bedtime.  120 capsule  0   No facility-administered encounter medications on file as of 09/16/2012.

## 2012-09-19 DIAGNOSIS — K219 Gastro-esophageal reflux disease without esophagitis: Secondary | ICD-10-CM | POA: Insufficient documentation

## 2012-09-19 NOTE — Assessment & Plan Note (Signed)
I have addressed  BMI and recommended wt loss of 10% of body weigh over the next 6 months using a low glycemic index diet and regular exercise a minimum of 5 days per week.   

## 2012-09-19 NOTE — Assessment & Plan Note (Addendum)
I suspect this is irritable bowel but will consider lactose intolerance, probably second is, or infectious although less likely given his lack of weight loss and fevers. Stool studies pending. Omeprazole discontinued. Dicyclomine trial initiated.

## 2012-09-19 NOTE — Assessment & Plan Note (Signed)
Symptoms have not improved with over-the-counter omeprazole. This may be ilantausing his diarrhea. Trial of dexilant,x 3 weeks, with samples given.

## 2012-09-20 ENCOUNTER — Other Ambulatory Visit: Payer: Self-pay | Admitting: Internal Medicine

## 2012-09-20 ENCOUNTER — Other Ambulatory Visit: Payer: Self-pay | Admitting: *Deleted

## 2012-09-20 ENCOUNTER — Telehealth: Payer: Self-pay | Admitting: Internal Medicine

## 2012-09-20 DIAGNOSIS — R197 Diarrhea, unspecified: Secondary | ICD-10-CM | POA: Diagnosis not present

## 2012-09-20 LAB — CELIAC DISEASE AB SCREEN W/RFX
Antigliadin Abs, IgA: 7 units (ref 0–19)
IgA/Immunoglobulin A, Serum: 214 mg/dL (ref 91–414)
Transglutaminase IgA: <2 U/mL (ref 0–3)

## 2012-09-20 MED ORDER — TRAMADOL HCL 50 MG PO TABS: 100.0000 mg | ORAL_TABLET | Freq: Three times a day (TID) | ORAL | Status: DC | PRN

## 2012-09-20 NOTE — Telephone Encounter (Signed)
Script faxed.

## 2012-09-20 NOTE — Telephone Encounter (Signed)
Pt's wife is wanting to know if they could get an exemption to have the tramadol as a tier 3

## 2012-09-20 NOTE — Addendum Note (Signed)
Addended by: Montine Circle D on: 09/20/2012 02:14 PM   Modules accepted: Orders

## 2012-09-21 ENCOUNTER — Encounter: Payer: Self-pay | Admitting: *Deleted

## 2012-09-21 LAB — OVA AND PARASITE SCREEN: OP: NONE SEEN

## 2012-09-23 ENCOUNTER — Encounter: Payer: Self-pay | Admitting: *Deleted

## 2012-09-24 LAB — STOOL CULTURE

## 2012-09-24 LAB — OTHER SOLSTAS TEST

## 2012-09-29 ENCOUNTER — Encounter: Payer: Self-pay | Admitting: *Deleted

## 2012-09-30 ENCOUNTER — Ambulatory Visit (INDEPENDENT_AMBULATORY_CARE_PROVIDER_SITE_OTHER): Payer: Medicare Other | Admitting: Internal Medicine

## 2012-09-30 ENCOUNTER — Encounter: Payer: Self-pay | Admitting: Internal Medicine

## 2012-09-30 VITALS — BP 112/70 | HR 60 | Temp 97.7°F | Resp 14 | Ht 69.75 in | Wt 215.8 lb

## 2012-09-30 DIAGNOSIS — R197 Diarrhea, unspecified: Secondary | ICD-10-CM

## 2012-09-30 DIAGNOSIS — K589 Irritable bowel syndrome without diarrhea: Secondary | ICD-10-CM | POA: Diagnosis not present

## 2012-09-30 DIAGNOSIS — Z23 Encounter for immunization: Secondary | ICD-10-CM | POA: Diagnosis not present

## 2012-09-30 DIAGNOSIS — K219 Gastro-esophageal reflux disease without esophagitis: Secondary | ICD-10-CM

## 2012-09-30 MED ORDER — LANSOPRAZOLE 30 MG PO TBDP: 30.0000 mg | ORAL_TABLET | Freq: Every day | ORAL | Status: DC

## 2012-09-30 MED ORDER — DEXLANSOPRAZOLE 60 MG PO CPDR: 60.0000 mg | DELAYED_RELEASE_CAPSULE | Freq: Every day | ORAL | Status: DC

## 2012-09-30 NOTE — Assessment & Plan Note (Addendum)
All stool studies were normal. His symptoms have improved with use of dicyclomine and suspension of omeprazole. I suspect that his symptoms are due to irritable bowel. He has been altering his diet as well. No further workup at this time.

## 2012-09-30 NOTE — Patient Instructions (Addendum)
While we are waiting for Dexilant approval:  Check  Formulary  For laternatives that are covered  aciphex protonix nexium  Or you can try takign prevacid twice daily  (the most similar to dexilant)

## 2012-10-01 ENCOUNTER — Encounter: Payer: Self-pay | Admitting: Internal Medicine

## 2012-10-01 DIAGNOSIS — K589 Irritable bowel syndrome without diarrhea: Secondary | ICD-10-CM | POA: Insufficient documentation

## 2012-10-01 NOTE — Progress Notes (Signed)
Patient ID: Nathan Ramirez, male   DOB: [birth year], 67 y.o.   MRN: [id number]  Patient Active Problem List   Diagnosis Date Noted  . Irritable bowel syndrome 10/01/2012  . GERD (gastroesophageal reflux disease) 09/19/2012  . Diarrhea 09/16/2012  . Physical exam 06/15/2012  . Hyperlipidemia LDL goal <100 11/17/2011  . Obesity (BMI 30.0-34.9) 11/17/2011  . Hypertension 10/15/2011  . ALLERGIC RHINITIS CAUSE UNSPECIFIED 02/20/2010  . ASTHMA 02/20/2010    Subjective:  CC:   Chief Complaint  Patient presents with  . Follow-up    HPI:   Nathan Ramirez a 67 y.o. male who presents Two-week followup on evaluation and treatment of chronic diarrhea. Patient states that his stools have improved in consistency although they are not completely solid yet. Medication changes include suspension of the omeprazole and trial of Dexilant.  He states that the addition of dicyclomine prior to each meal has improved his symptomatology.  Workup included stool studies which were all negative. He had a normal colonoscopy in 2012. He has had no unintentional weight loss or blood in his stools.   Past Medical History  Diagnosis Date  . Asthma   . Arthritis   . Chicken pox   . Allergy   . Elevated blood pressure reading     Past Surgical History  Procedure Laterality Date  . Lumbar disc surgery  2008  . Appendectomy  1972  . Knee arthroscopy  2009& 2010    RIGHT AND LEFT KNEE  . Spine surgery      lumbar diskectomy       The following portions of the patient's history were reviewed and updated as appropriate: Allergies, current medications, and problem list.    Review of Systems:   12 Pt  review of systems was negative except those addressed in the HPI,     History   Social History  . Marital Status: Married    Spouse Name: N/A    Number of Children: N/A  . Years of Education: N/A   Occupational History  . Not on file.   Social History Main Topics  . Smoking status:  Former Games developer  . Smokeless tobacco: Not on file  . Alcohol Use: Yes     Comment: OCCASSIONALLY  . Drug Use: No  . Sexual Activity: Not on file   Other Topics Concern  . Not on file   Social History Narrative  . No narrative on file    Objective:  Filed Vitals:   09/30/12 1047  BP: 112/70  Pulse: 60  Temp: 97.7 F (36.5 C)  Resp: 14     General appearance: alert, cooperative and appears stated age Ears: normal TM's and external ear canals both ears Throat: lips, mucosa, and tongue normal; teeth and gums normal Neck: no adenopathy, no carotid bruit, supple, symmetrical, trachea midline and thyroid not enlarged, symmetric, no tenderness/mass/nodules Back: symmetric, no curvature. ROM normal. No CVA tenderness. Lungs: clear to auscultation bilaterally Heart: regular rate and rhythm, S1, S2 normal, no murmur, click, rub or gallop Abdomen: soft, non-tender; bowel sounds normal; no masses,  no organomegaly Pulses: 2+ and symmetric Skin: Skin color, texture, turgor normal. No rashes or lesions Lymph nodes: Cervical, supraclavicular, and axillary nodes normal.  Assessment and Plan:  Diarrhea All stool studies were normal. His symptoms have improved with use of dicyclomine and suspension of omeprazole. I suspect that his symptoms are due to irritable bowel. He has been altering his diet as well. No further workup at  this time.  GERD (gastroesophageal reflux disease) Daily symptoms have resolved with trial of Dexilant.  Prescription was given along with an alternative prescription for lansoprazole twice a day while we are waiting for approval of the former drug.  Irritable bowel syndrome Managed with dietary modifications and dicyclomine. No further workup at this time.   Updated Medication List Outpatient Encounter Prescriptions as of 09/30/2012  Medication Sig Dispense Refill  . dexlansoprazole (DEXILANT) 60 MG capsule Take 1 capsule (60 mg total) by mouth daily.  30  capsule  11  . dicyclomine (BENTYL) 10 MG capsule Take 1 capsule (10 mg total) by mouth 4 (four) times daily -  before meals and at bedtime.  120 capsule  0  . Fluticasone-Salmeterol (ADVAIR) 100-50 MCG/DOSE AEPB Inhale 1 puff into the lungs 2 (two) times daily as needed.  60 each  2  . loratadine (CLARITIN) 10 MG tablet Take 10 mg by mouth daily as needed.      . traMADol (ULTRAM) 50 MG tablet Take 2 tablets (100 mg total) by mouth every 8 (eight) hours as needed for pain. Maximum 6 tablets daily  180 tablet  5  . [DISCONTINUED] dexlansoprazole (DEXILANT) 60 MG capsule Take 1 capsule (60 mg total) by mouth daily.  15 capsule  0  . amLODipine (NORVASC) 2.5 MG tablet TAKE ONE TABLET BY MOUTH EVERY DAY  90 tablet  2  . lansoprazole (PREVACID SOLUTAB) 30 MG disintegrating tablet Take 1 tablet (30 mg total) by mouth daily.  30 tablet  11   No facility-administered encounter medications on file as of 09/30/2012.     Orders Placed This Encounter  Procedures  . Flu Vaccine QUAD 36+ mos PF IM (Fluarix)    No Follow-up on file.

## 2012-10-01 NOTE — Assessment & Plan Note (Signed)
Daily symptoms have resolved with trial of Dexilant.  Prescription was given along with an alternative prescription for lansoprazole twice a day while we are waiting for approval of the former drug.

## 2012-10-01 NOTE — Assessment & Plan Note (Signed)
Managed with dietary modifications and dicyclomine. No further workup at this time.

## 2012-10-11 ENCOUNTER — Encounter: Payer: Self-pay | Admitting: *Deleted

## 2012-10-11 ENCOUNTER — Encounter: Payer: Self-pay | Admitting: Internal Medicine

## 2012-10-11 ENCOUNTER — Ambulatory Visit (INDEPENDENT_AMBULATORY_CARE_PROVIDER_SITE_OTHER): Payer: Medicare Other | Admitting: Internal Medicine

## 2012-10-11 VITALS — BP 122/78 | HR 60 | Temp 97.7°F | Resp 14 | Wt 217.5 lb

## 2012-10-11 DIAGNOSIS — R42 Dizziness and giddiness: Secondary | ICD-10-CM | POA: Diagnosis not present

## 2012-10-11 DIAGNOSIS — Z01818 Encounter for other preprocedural examination: Secondary | ICD-10-CM

## 2012-10-11 DIAGNOSIS — Z23 Encounter for immunization: Secondary | ICD-10-CM

## 2012-10-11 DIAGNOSIS — R5381 Other malaise: Secondary | ICD-10-CM

## 2012-10-11 DIAGNOSIS — R197 Diarrhea, unspecified: Secondary | ICD-10-CM | POA: Diagnosis not present

## 2012-10-11 DIAGNOSIS — E785 Hyperlipidemia, unspecified: Secondary | ICD-10-CM | POA: Diagnosis not present

## 2012-10-11 LAB — CBC WITH DIFFERENTIAL/PLATELET
Basophils Absolute: 0 10*3/uL (ref 0.0–0.1)
Basophils Relative: 0.3 % (ref 0.0–3.0)
Eosinophils Absolute: 0.3 10*3/uL (ref 0.0–0.7)
Eosinophils Relative: 3.7 % (ref 0.0–5.0)
HCT: 42.7 % (ref 39.0–52.0)
Hemoglobin: 14.4 g/dL (ref 13.0–17.0)
Lymphocytes Relative: 25.4 % (ref 12.0–46.0)
Lymphs Abs: 1.7 10*3/uL (ref 0.7–4.0)
MCHC: 33.7 g/dL (ref 30.0–36.0)
MCV: 96.1 fl (ref 78.0–100.0)
Monocytes Absolute: 0.6 10*3/uL (ref 0.1–1.0)
Monocytes Relative: 8.8 % (ref 3.0–12.0)
Neutro Abs: 4.2 10*3/uL (ref 1.4–7.7)
Neutrophils Relative %: 61.8 % (ref 43.0–77.0)
Platelets: 230 10*3/uL (ref 150.0–400.0)
RBC: 4.44 Mil/uL (ref 4.22–5.81)
RDW: 14.2 % (ref 11.5–14.6)
WBC: 6.8 10*3/uL (ref 4.5–10.5)

## 2012-10-11 LAB — COMPREHENSIVE METABOLIC PANEL
ALT: 40 U/L (ref 0–53)
AST: 38 U/L — ABNORMAL HIGH (ref 0–37)
Albumin: 4.2 g/dL (ref 3.5–5.2)
Alkaline Phosphatase: 52 U/L (ref 39–117)
BUN: 15 mg/dL (ref 6–23)
CO2: 24 mEq/L (ref 19–32)
Calcium: 9.4 mg/dL (ref 8.4–10.5)
Chloride: 110 mEq/L (ref 96–112)
Creatinine, Ser: 1.1 mg/dL (ref 0.4–1.5)
GFR: 69.92 mL/min (ref >60.00)
Glucose, Bld: 99 mg/dL (ref 70–99)
Potassium: 4.4 mEq/L (ref 3.5–5.1)
Sodium: 139 mEq/L (ref 135–145)
Total Bilirubin: 0.6 mg/dL (ref 0.3–1.2)
Total Protein: 7 g/dL (ref 6.0–8.3)

## 2012-10-11 LAB — LIPID PANEL
Cholesterol: 198 mg/dL (ref 0–200)
HDL: 57.7 mg/dL (ref >39.00)
LDL Cholesterol: 127 mg/dL — ABNORMAL HIGH (ref 0–99)
Total CHOL/HDL Ratio: 3
Triglycerides: 68 mg/dL (ref 0.0–149.0)
VLDL: 13.6 mg/dL (ref 0.0–40.0)

## 2012-10-11 LAB — MAGNESIUM: Magnesium: 2.1 mg/dL (ref 1.5–2.5)

## 2012-10-11 LAB — TSH: TSH: 1.33 u[IU]/mL (ref 0.35–5.50)

## 2012-10-11 MED ORDER — TETANUS-DIPHTH-ACELL PERTUSSIS 5-2.5-18.5 LF-MCG/0.5 IM SUSP: 0.5000 mL | Freq: Once | INTRAMUSCULAR | Status: DC

## 2012-10-11 NOTE — Addendum Note (Signed)
Addended by: Dennie Bible on: 10/11/2012 03:11 PM   Modules accepted: Orders

## 2012-10-11 NOTE — Assessment & Plan Note (Signed)
NOT orthostatic on exam today.  Symptoms started after working outside all day in warm weather.  Recommended rehydration with Gatorade, not water.

## 2012-10-11 NOTE — Progress Notes (Signed)
Patient ID: Nathan Ramirez, male   DOB: January 23, 1945, 67 y.o.   MRN: [id number]  Patient Active Problem List   Diagnosis Date Noted  . Dizziness and giddiness 10/11/2012  . Preoperative evaluation to rule out surgical contraindication 10/11/2012  . Irritable bowel syndrome 10/01/2012  . GERD (gastroesophageal reflux disease) 09/19/2012  . Diarrhea 09/16/2012  . Physical exam 06/15/2012  . Hyperlipidemia LDL goal <100 11/17/2011  . Obesity (BMI 30.0-34.9) 11/17/2011  . ALLERGIC RHINITIS CAUSE UNSPECIFIED 02/20/2010  . ASTHMA 02/20/2010    Subjective:  CC:   No chief complaint on file.   HPI:   Nathan Ramirez a 67 y.o. male who presents preoperative clearance for bilateral knee arthroscopies requested by Ollen Gross, MD [facility].  Nov 19 th 2014.  General anesthesia presumed.  He has had mild dizziness with sudden change in position since Sunday morning.  No vertigo,  Chest pain,  Dyspnea or headache.  Spent Saturday working in the horse barn all day and was very sweaty and tired when he finished.  Rehydrated with water, but has been off balance since then.   He has no history of prior or recent chest pain.  No exertional dyspnea.  No history of hypertension or blood clotting disorder .  Normal renal function .  No prior reaction to anesthesia.        Past Medical History  Diagnosis Date  . Asthma   . Arthritis   . Chicken pox   . Allergy   . Elevated blood pressure reading     Past Surgical History  Procedure Laterality Date  . Lumbar disc surgery  2008  . Appendectomy  1972  . Knee arthroscopy  2009& 2010    RIGHT AND LEFT KNEE  . Spine surgery      lumbar diskectomy       The following portions of the patient's history were reviewed and updated as appropriate: Allergies, current medications, and problem list.    Review of Systems:   12 Pt  review of systems was negative except those addressed in the HPI,     History   Social  History  . Marital Status: Married    Spouse Name: N/A    Number of Children: N/A  . Years of Education: N/A   Occupational History  . Not on file.   Social History Main Topics  . Smoking status: Former Games developer  . Smokeless tobacco: Not on file  . Alcohol Use: Yes     Comment: OCCASSIONALLY  . Drug Use: No  . Sexual Activity: Not on file   Other Topics Concern  . Not on file   Social History Narrative  . No narrative on file    Objective:  Filed Vitals:   10/11/12 1307  BP: 122/78  Pulse: 60  Temp:   Resp:      General appearance: alert, cooperative and appears stated age Ears: normal TM's and external ear canals both ears Throat: lips, mucosa, and tongue normal; teeth and gums normal Neck: no adenopathy, no carotid bruit, supple, symmetrical, trachea midline and thyroid not enlarged, symmetric, no tenderness/mass/nodules Back: symmetric, no curvature. ROM normal. No CVA tenderness. Lungs: clear to auscultation bilaterally Heart: regular rate and rhythm, S1, S2 normal, no murmur, click, rub or gallop Abdomen: soft, non-tender; bowel sounds normal; no masses,  no organomegaly Pulses: 2+ and symmetric Skin: Skin color, texture, turgor normal. No rashes or lesions Lymph nodes: Cervical, supraclavicular, and axillary nodes normal.  Assessment and  Plan:  Dizziness and giddiness NOT orthostatic on exam today.  Symptoms started after working outside all day in warm weather.  Recommended rehydration with Gatorade, not water.   Diarrhea Secondary to omeprazole vs IBS>  contineu bentyl,  Suspend omeprazole and resume PPI only if needed.   Preoperative evaluation to rule out surgical contraindication Patient  is considered to be at low risk  For perioperative complications  Based on today's exam and history.  Baseline lytes,  hgb and ekg done.   Updated Medication List Outpatient Encounter Prescriptions as of 10/11/2012  Medication Sig Dispense Refill  .  dexlansoprazole (DEXILANT) 60 MG capsule Take 1 capsule (60 mg total) by mouth daily.  30 capsule  11  . dicyclomine (BENTYL) 10 MG capsule Take 1 capsule (10 mg total) by mouth 4 (four) times daily -  before meals and at bedtime.  120 capsule  0  . Fluticasone-Salmeterol (ADVAIR) 100-50 MCG/DOSE AEPB Inhale 1 puff into the lungs 2 (two) times daily as needed.  60 each  2  . lansoprazole (PREVACID SOLUTAB) 30 MG disintegrating tablet Take 1 tablet (30 mg total) by mouth daily.  30 tablet  11  . loratadine (CLARITIN) 10 MG tablet Take 10 mg by mouth daily as needed.      . traMADol (ULTRAM) 50 MG tablet Take 2 tablets (100 mg total) by mouth every 8 (eight) hours as needed for pain. Maximum 6 tablets daily  180 tablet  5  . TDaP (BOOSTRIX) 5-2.5-18.5 LF-MCG/0.5 injection Inject 0.5 mLs into the muscle once.  0.5 mL  0  . [DISCONTINUED] amLODipine (NORVASC) 2.5 MG tablet TAKE ONE TABLET BY MOUTH EVERY DAY  90 tablet  2   No facility-administered encounter medications on file as of 10/11/2012.     Orders Placed This Encounter  Procedures  . CBC with Differential  . TSH  . Lipid panel  . Magnesium  . Comprehensive metabolic panel  . EKG 12-Lead    No Follow-up on file.

## 2012-10-11 NOTE — Patient Instructions (Addendum)
Please returen for your annual medicare exam when convenient  i recommend the TDaP vaccine .  It is less $$$ at the health dept  You received the Pneumonia vaccine today

## 2012-10-11 NOTE — Assessment & Plan Note (Signed)
Secondary to omeprazole vs IBS>  contineu bentyl,  Suspend omeprazole and resume PPI only if needed.

## 2012-10-11 NOTE — Assessment & Plan Note (Signed)
Patient  is considered to be at low risk  For perioperative complications  Based on today's exam and history.  Baseline lytes,  hgb and ekg done.  

## 2012-10-12 ENCOUNTER — Encounter: Payer: Self-pay | Admitting: *Deleted

## 2012-10-27 ENCOUNTER — Other Ambulatory Visit: Payer: Self-pay | Admitting: Internal Medicine

## 2012-10-28 DIAGNOSIS — Z23 Encounter for immunization: Secondary | ICD-10-CM | POA: Diagnosis not present

## 2012-11-16 ENCOUNTER — Encounter: Payer: Self-pay | Admitting: *Deleted

## 2012-11-17 ENCOUNTER — Ambulatory Visit (INDEPENDENT_AMBULATORY_CARE_PROVIDER_SITE_OTHER): Payer: Medicare Other | Admitting: Internal Medicine

## 2012-11-17 ENCOUNTER — Encounter: Payer: Self-pay | Admitting: Internal Medicine

## 2012-11-17 VITALS — BP 110/70 | HR 56 | Temp 97.7°F | Ht 69.0 in | Wt 214.0 lb

## 2012-11-17 DIAGNOSIS — Z0000 Encounter for general adult medical examination without abnormal findings: Secondary | ICD-10-CM

## 2012-11-17 DIAGNOSIS — E786 Lipoprotein deficiency: Secondary | ICD-10-CM | POA: Diagnosis not present

## 2012-11-17 DIAGNOSIS — Z125 Encounter for screening for malignant neoplasm of prostate: Secondary | ICD-10-CM | POA: Diagnosis not present

## 2012-11-17 DIAGNOSIS — E785 Hyperlipidemia, unspecified: Secondary | ICD-10-CM

## 2012-11-17 DIAGNOSIS — E669 Obesity, unspecified: Secondary | ICD-10-CM

## 2012-11-17 DIAGNOSIS — B351 Tinea unguium: Secondary | ICD-10-CM | POA: Diagnosis not present

## 2012-11-17 DIAGNOSIS — E66811 Obesity, class 1: Secondary | ICD-10-CM

## 2012-11-17 LAB — PSA, MEDICARE: PSA: 0.56 ng/ml (ref 0.10–4.00)

## 2012-11-17 MED ORDER — DICLOFENAC SODIUM 75 MG PO TBEC: 75.0000 mg | DELAYED_RELEASE_TABLET | Freq: Two times a day (BID) | ORAL | Status: DC

## 2012-11-17 MED ORDER — HYDROCODONE-ACETAMINOPHEN 5-325 MG PO TABS: 1.0000 | ORAL_TABLET | Freq: Four times a day (QID) | ORAL | Status: DC | PRN

## 2012-11-17 NOTE — Progress Notes (Signed)
Patient ID: Nathan Ramirez, male   DOB: 02/13/1945, 67 y.o.   MRN: [id number]  The patient is here for annual Medicare wellness examination and management of other chronic and acute problems.   The risk factors are reflected in the social history.  The roster of all physicians providing medical care to patient - is listed in the Snapshot section of the chart.  Activities of daily living:  The patient is 100% independent in all ADLs: dressing, toileting, feeding as well as independent mobility  Home safety : The patient has smoke detectors in the home. They wear seatbelts.  There are no firearms at home. There is no violence in the home.   There is no risks for hepatitis, STDs or HIV. There is no   history of blood transfusion. They have no travel history to infectious disease endemic areas of the world.  The patient has seen their dentist in the last six month. They have seen their eye doctor in the last year. They admit to slight hearing difficulty with regard to whispered voices and some television programs.  They have deferred audiologic testing in the last year.  They do not  have excessive sun exposure. Discussed the need for sun protection: hats, long sleeves and use of sunscreen if there is significant sun exposure.   Diet: the importance of a healthy diet is discussed. They do have a healthy diet.  The benefits of regular aerobic exercise were discussed. She walks 4 times per week ,  20 minutes.   Depression screen: there are no signs or vegative symptoms of depression- irritability, change in appetite, anhedonia, sadness/tearfullness.  Cognitive assessment: the patient manages all their financial and personal affairs and is actively engaged. They could relate day,date,year and events; recalled 2/3 objects at 3 minutes; performed clock-face test normally.  The following portions of the patient's history were reviewed and updated as appropriate: allergies, current medications, past  family history, past medical history,  past surgical history, past social history  and problem list.  Visual acuity was not assessed per patient preference since she has regular follow up with her ophthalmologist. Hearing and body mass index were assessed and reviewed.   During the course of the visit the patient was educated and counseled about appropriate screening and preventive services including : fall prevention , diabetes screening, nutrition counseling, colorectal cancer screening, and recommended immunizations.    Objective:  BP 110/70  Pulse 56  Temp(Src) 97.7 F (36.5 C) (Oral)  Ht 5\' 9"  (1.753 m)  Wt 214 lb (97.07 kg)  BMI 31.59 kg/m2  SpO2 97%  General Appearance:    Alert, cooperative, no distress, appears stated age  Head:    Normocephalic, without obvious abnormality, atraumatic  Eyes:    PERRL, conjunctiva/corneas clear, EOM's intact, fundi    benign, both eyes       Ears:    Normal TM's and external ear canals, both ears  Nose:   Nares normal, septum midline, mucosa normal, no drainage   or sinus tenderness  Throat:   Lips, mucosa, and tongue normal; teeth and gums normal  Neck:   Supple, symmetrical, trachea midline, no adenopathy;       thyroid:  No enlargement/tenderness/nodules; no carotid   bruit or JVD  Back:     Symmetric, no curvature, ROM normal, no CVA tenderness  Lungs:     Clear to auscultation bilaterally, respirations unlabored  Chest wall:    No tenderness or deformity  Heart:  Regular rate and rhythm, S1 and S2 normal, no murmur, rub   or gallop  Abdomen:     Soft, non-tender, bowel sounds active all four quadrants,    no masses, no organomegaly  Extremities:   Extremities normal, atraumatic, no cyanosis or edema  Pulses:   2+ and symmetric all extremities  Skin:   Skin color, texture, turgor normal, no rashes or lesions  Lymph nodes:   Cervical, supraclavicular, and axillary nodes normal  Neurologic:   CNII-XII intact. Normal strength,  sensation and reflexes      throughout   Assessment and Plan:  Hyperlipidemia LDL goal <100    Other and unspecified hyperlipidemia His 10 yr risk of coronary events is 11% . New ACC guidelines recommend starting patients aged 86 or higher on moderate intensity statin therapy for LDL between 70-189 and 10 yr risk of CAD > 7.5% ;  and high intensity therapy for anyone with LDL > 190. Marland Kitchen  Patient and wife were not interested in starting therapy after discussion   Lab Results  Component Value Date   CHOL 198 10/11/2012   HDL 57.70 10/11/2012   LDLCALC 127* 10/11/2012   LDLDIRECT 161.8 10/15/2011   TRIG 68.0 10/11/2012   CHOLHDL 3 10/11/2012    Obesity (BMI 30.0-34.9) I have addressed  BMI and recommended wt loss of 10% of body weigh over the next 6 months using a low glycemic index diet and regular exercise a minimum of 5 days per week.    Medicare welcome visit Annual wellness  exam was done excluding testicular and prostate exam since this was done earlier in the year at the [facility].  All screenings brought up to date.  Lab Results  Component Value Date   PSA 0.56 11/17/2012   PSA 0.44 10/15/2011        Updated Medication List Outpatient Encounter Prescriptions as of 11/17/2012  Medication Sig  . diclofenac (VOLTAREN) 75 MG EC tablet Take 1 tablet (75 mg total) by mouth 2 (two) times daily.  Marland Kitchen dicyclomine (BENTYL) 10 MG capsule TAKE ONE CAPSULE BY MOUTH 4 TIMES DAILY BEFORE MEALS AND AT BEDTIME  . Fluticasone-Salmeterol (ADVAIR) 100-50 MCG/DOSE AEPB Inhale 1 puff into the lungs 2 (two) times daily as needed.  . lansoprazole (PREVACID SOLUTAB) 30 MG disintegrating tablet Take 1 tablet (30 mg total) by mouth daily.  . traMADol (ULTRAM) 50 MG tablet Take 2 tablets (100 mg total) by mouth every 8 (eight) hours as needed for pain. Maximum 6 tablets daily  . [DISCONTINUED] dexlansoprazole (DEXILANT) 60 MG capsule Take 1 capsule (60 mg total) by mouth daily.  . [DISCONTINUED] diclofenac  (VOLTAREN) 75 MG EC tablet Take 75 mg by mouth 2 (two) times daily.  Marland Kitchen HYDROcodone-acetaminophen (NORCO/VICODIN) 5-325 MG per tablet Take 1 tablet by mouth every 6 (six) hours as needed for moderate pain.  . [DISCONTINUED] loratadine (CLARITIN) 10 MG tablet Take 10 mg by mouth daily as needed.  . [DISCONTINUED] TDaP (BOOSTRIX) 5-2.5-18.5 LF-MCG/0.5 injection Inject 0.5 mLs into the muscle once.

## 2012-11-17 NOTE — Progress Notes (Signed)
Pre-visit discussion using our clinic review tool. No additional management support is needed unless otherwise documented below in the visit note.  

## 2012-11-18 ENCOUNTER — Encounter: Payer: Self-pay | Admitting: *Deleted

## 2012-11-19 DIAGNOSIS — Z0000 Encounter for general adult medical examination without abnormal findings: Secondary | ICD-10-CM | POA: Insufficient documentation

## 2012-11-19 NOTE — Assessment & Plan Note (Signed)
Annual wellness  exam was done excluding testicular and prostate exam since this was done earlier in the year at the [facility].  All screenings brought up to date.  Lab Results  Component Value Date   PSA 0.56 11/17/2012   PSA 0.44 10/15/2011

## 2012-11-19 NOTE — Assessment & Plan Note (Signed)
I have addressed  BMI and recommended wt loss of 10% of body weigh over the next 6 months using a low glycemic index diet and regular exercise a minimum of 5 days per week.   

## 2012-11-19 NOTE — Assessment & Plan Note (Addendum)
His 10 yr risk of coronary events is 11% . New ACC guidelines recommend starting patients aged 65 or higher on moderate intensity statin therapy for LDL between 70-189 and 10 yr risk of CAD > 7.5% ;  and high intensity therapy for anyone with LDL > 190. Marland Kitchen  Patient and wife were not interested in starting therapy after discussion   Lab Results  Component Value Date   CHOL 198 10/11/2012   HDL 57.70 10/11/2012   LDLCALC 127* 10/11/2012   LDLDIRECT 161.8 10/15/2011   TRIG 68.0 10/11/2012   CHOLHDL 3 10/11/2012

## 2012-11-23 ENCOUNTER — Other Ambulatory Visit: Payer: Self-pay | Admitting: Orthopedic Surgery

## 2012-11-23 ENCOUNTER — Encounter (HOSPITAL_COMMUNITY): Payer: Self-pay | Admitting: Pharmacy Technician

## 2012-11-23 NOTE — Progress Notes (Signed)
Preoperative surgical orders have been place into the Epic hospital system for MARICE ANGELINO on 11/23/2012, 9:59 AM  by Patrica Duel for surgery on 12/01/2012.  Preop Bilateral Total Knee orders including Epidural per Anesthesia, IV Tylenol, and IV Decadron as long as there are no contraindications to the above medications. Avel Peace, PA-C

## 2012-11-24 ENCOUNTER — Encounter (HOSPITAL_COMMUNITY)
Admission: RE | Admit: 2012-11-24 | Discharge: 2012-11-24 | Disposition: A | Discharge status: Home / Self Care | Payer: Medicare Other | Source: Ambulatory Visit | Attending: Orthopedic Surgery | Admitting: Orthopedic Surgery

## 2012-11-24 ENCOUNTER — Encounter (HOSPITAL_COMMUNITY): Payer: Self-pay

## 2012-11-24 ENCOUNTER — Ambulatory Visit (HOSPITAL_COMMUNITY)
Admission: RE | Admit: 2012-11-24 | Discharge: 2012-11-24 | Disposition: A | Discharge status: Home / Self Care | Payer: Medicare Other | Source: Ambulatory Visit | Attending: Orthopedic Surgery | Admitting: Orthopedic Surgery

## 2012-11-24 DIAGNOSIS — Z01812 Encounter for preprocedural laboratory examination: Secondary | ICD-10-CM | POA: Diagnosis not present

## 2012-11-24 DIAGNOSIS — J45909 Unspecified asthma, uncomplicated: Secondary | ICD-10-CM | POA: Insufficient documentation

## 2012-11-24 DIAGNOSIS — Z01818 Encounter for other preprocedural examination: Secondary | ICD-10-CM | POA: Diagnosis not present

## 2012-11-24 DIAGNOSIS — K219 Gastro-esophageal reflux disease without esophagitis: Secondary | ICD-10-CM | POA: Diagnosis not present

## 2012-11-24 DIAGNOSIS — M1710 Unilateral primary osteoarthritis, unspecified knee: Secondary | ICD-10-CM | POA: Diagnosis not present

## 2012-11-24 DIAGNOSIS — E785 Hyperlipidemia, unspecified: Secondary | ICD-10-CM | POA: Insufficient documentation

## 2012-11-24 HISTORY — DX: Headache: R51

## 2012-11-24 HISTORY — DX: Pure hypercholesterolemia, unspecified: E78.00

## 2012-11-24 HISTORY — DX: Other specified postprocedural states: Z98.890

## 2012-11-24 HISTORY — DX: Nausea with vomiting, unspecified: R11.2

## 2012-11-24 HISTORY — DX: Gastro-esophageal reflux disease without esophagitis: K21.9

## 2012-11-24 LAB — COMPREHENSIVE METABOLIC PANEL
ALT: 34 U/L (ref 0–53)
AST: 32 U/L (ref 0–37)
Albumin: 4.2 g/dL (ref 3.5–5.2)
Alkaline Phosphatase: 66 U/L (ref 39–117)
BUN: 15 mg/dL (ref 6–23)
CO2: 25 mEq/L (ref 19–32)
Calcium: 9.8 mg/dL (ref 8.4–10.5)
Chloride: 103 mEq/L (ref 96–112)
Creatinine, Ser: 1.01 mg/dL (ref 0.50–1.35)
GFR calc Af Amer: 88 mL/min — ABNORMAL LOW (ref >90)
GFR calc non Af Amer: 76 mL/min — ABNORMAL LOW (ref >90)
Glucose, Bld: 98 mg/dL (ref 70–99)
Potassium: 4.2 mEq/L (ref 3.5–5.1)
Sodium: 137 mEq/L (ref 135–145)
Total Bilirubin: 0.4 mg/dL (ref 0.3–1.2)
Total Protein: 7.5 g/dL (ref 6.0–8.3)

## 2012-11-24 LAB — URINALYSIS, ROUTINE W REFLEX MICROSCOPIC
Bilirubin Urine: NEGATIVE
Glucose, UA: NEGATIVE mg/dL
Hgb urine dipstick: NEGATIVE
Ketones, ur: NEGATIVE mg/dL
Leukocytes, UA: NEGATIVE
Nitrite: NEGATIVE
Protein, ur: NEGATIVE mg/dL
Specific Gravity, Urine: 1.011 (ref 1.005–1.030)
Urobilinogen, UA: 0.2 mg/dL (ref 0.0–1.0)
pH: 6.5 (ref 5.0–8.0)

## 2012-11-24 LAB — ABO/RH: ABO/RH(D): O POS

## 2012-11-24 LAB — PROTIME-INR
INR: 0.97 (ref 0.00–1.49)
Prothrombin Time: 12.7 seconds (ref 11.6–15.2)

## 2012-11-24 LAB — CBC
HCT: 44.5 % (ref 39.0–52.0)
Hemoglobin: 15.1 g/dL (ref 13.0–17.0)
MCH: 32.1 pg (ref 26.0–34.0)
MCHC: 33.9 g/dL (ref 30.0–36.0)
MCV: 94.7 fL (ref 78.0–100.0)
Platelets: 264 10*3/uL (ref 150–400)
RBC: 4.7 MIL/uL (ref 4.22–5.81)
RDW: 13 % (ref 11.5–15.5)
WBC: 7.3 10*3/uL (ref 4.0–10.5)

## 2012-11-24 LAB — SURGICAL PCR SCREEN
MRSA, PCR: NEGATIVE
Staphylococcus aureus: POSITIVE — AB

## 2012-11-24 LAB — APTT: aPTT: 40 seconds — ABNORMAL HIGH (ref 24–37)

## 2012-11-24 NOTE — Progress Notes (Signed)
EKG 10/11/12 on EPIC, surgery clearance note 10/11/12 Dr. Darrick Huntsman on chart

## 2012-11-24 NOTE — Patient Instructions (Addendum)
20 UZOMA VIVONA  11/24/2012   Your procedure is scheduled on: 12/01/12  Report to [facility] at 11:00 AM.  Call this number if you have problems the morning of surgery [phone]: [phone]   Remember: please bring inhaler on day of surgery   Do not eat food After Midnight, clear liquids from midnight until 7:30am on 12/01/12 then nothing.      Take these medicines the morning of surgery with A SIP OF WATER: advair, prevacid, vicodin if needed   Do not wear jewelry, make-up or nail polish.  Do not wear lotions, powders, or perfumes. You may wear deodorant.  Do not shave 48 hours prior to surgery. Men may shave face and neck.  Do not bring valuables to the hospital.  Contacts, dentures or bridgework may not be worn into surgery.  Leave suitcase in the car. After surgery it may be brought to your room.  For patients admitted to the hospital, checkout time is 11:00 AM the day of discharge.    Please read over the following fact sheets that you were given: MRSA Information, incentive spirometry fact sheet, blood fact sheet. Birdie Sons, RN  pre op nurse call if needed [phone]    FAILURE TO FOLLOW THESE INSTRUCTIONS MAY RESULT IN CANCELLATION OF YOUR SURGERY   Patient Signature: ___________________________________________

## 2012-11-26 ENCOUNTER — Other Ambulatory Visit: Payer: Self-pay | Admitting: Orthopedic Surgery

## 2012-11-28 ENCOUNTER — Other Ambulatory Visit: Payer: Self-pay | Admitting: Orthopedic Surgery

## 2012-11-28 NOTE — H&P (Signed)
Nathan Ramirez  DOB: 08/06/1945 Married / Language: English / Race: White Male  Date of Admission:  12/01/2012  Chief Complaint:  Bilateral Knee Pain  History of Present Illness The patient is a 67 year old male who comes in for a preoperative History and Physical. The patient is scheduled for a bilateral total knee arthroplasty to be performed by Dr. Frank V. [name], MD at [facility] on 12/01/2012. The patient is a 67 year old male who presents today for follow up of their knee. The patient is being followed for their bilateral knee pain and osteoarthritis. They are now almost 2 years out from last office visit. Symptoms reported today include: pain, aching, stiffness, grinding, giving way and instability. The patient feels that they are doing poorly. Current treatment includes: NSAIDs (diclofenac). The following medication has been used for pain control: tramadol. The patient indicates that they have questions or concerns today regarding surgery. The knees are getting progressively worse. They are hurting at all times. They are limiting what he can and cannot do. He is retired now and can't get around at all. He is ready to get both knees replaced. They have been treated conservatively in the past for the above stated problem and despite conservative measures, they continue to have progressive pain and severe functional limitations and dysfunction. They have failed non-operative management including home exercise, medications, and injections. It is felt that they would benefit from undergoing bilateral total joint replacements. Risks and benefits of the procedure have been discussed with the patient and they elect to proceed with surgery. There are no active contraindications to surgery such as ongoing infection or rapidly progressive neurological disease.    Problem List Osteoarthritis of both knees (715.96). 07/11/2010    Allergies No Known Drug  Allergies    Family History Father. Deceased. Mother. COPD.    Social History Tobacco use. Former smoker. quit over 10 years ago Alcohol use. Occasional alcohol use. Beer Children. 2 Living situation. Lives with spouse. Post-Surgical Plans. Wants to look into [facility].    Medication History Diclofenac Sodium ( Oral) Specific dose unknown - Active. Prevacid (15MG Capsule DR, Oral) Active. Advair Diskus (100-50MCG/DOSE Aero Pow Br Act, Inhalation) Active. Ibuprofen (200MG Capsule, 1 Oral) Active. TraMADol HCl (50MG Tablet, Oral) Active. Multiple Vitamin (1 Oral) Active. Dicyclomine HCl ( Oral) Specific dose unknown - Active.    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 07/25/2010 Impaired Vision Impaired Hearing Asthma Hypercholesterolemia Gastroesophageal Reflux Disease    Review of Systems General:Not Present- Chills, Fever, Night Sweats, Fatigue, Weight Gain, Weight Loss and Memory Loss. Skin:Not Present- Hives, Itching, Rash, Eczema and Lesions. HEENT:Not Present- Tinnitus, Headache, Double Vision, Visual Loss, Hearing Loss and Dentures. Respiratory:Not Present- Shortness of breath with exertion, Shortness of breath at rest, Allergies, Coughing up blood and Chronic Cough. Cardiovascular:Not Present- Chest Pain, Racing/skipping heartbeats, Difficulty Breathing Lying Down, Murmur, Swelling and Palpitations. Gastrointestinal:Not Present- Bloody Stool, Heartburn, Abdominal Pain, Vomiting, Nausea, Constipation, Diarrhea, Difficulty Swallowing, Jaundice and Loss of appetitie. Male Genitourinary:Not Present- Urinary frequency, Blood in Urine, Weak urinary stream, Discharge, Flank Pain, Incontinence, Painful Urination, Urgency, Urinary Retention and Urinating at Night. Musculoskeletal:Not  Present- Muscle Weakness, Muscle Pain, Joint Swelling, Joint Pain, Back Pain, Morning Stiffness and Spasms. Neurological:Not Present- Tremor, Dizziness, Blackout spells, Paralysis, Difficulty with balance and Weakness. Psychiatric:Not Present- Insomnia.    Vitals Pulse:   64 (Regular) Resp.: 14 (Unlabored) BP: 112/68 (Sitting, Right Arm, Standard)     Physical Exam The physical exam findings are as follows:  Note: Patient is a 67 year old male with continued bilateral knee pain. Patient is accompanied today by his wife on exam.   General Mental Status - Alert, cooperative and good historian. General Appearance- pleasant. Not in acute distress. Orientation- Oriented X3. Build & Nutrition- Well nourished and Well developed.   Head and Neck Head- normocephalic, atraumatic . Neck Global Assessment- supple. no bruit auscultated on the right and no bruit auscultated on the left.   Eye Vision- Wears corrective lenses. Pupil- Bilateral- Regular and Round. Motion- Bilateral- EOMI.   Chest and Lung Exam Auscultation: Breath sounds:- clear at anterior chest wall and - clear at posterior chest wall. Adventitious sounds:- No Adventitious sounds.   Cardiovascular Auscultation:Rhythm- Regular rate and rhythm. Heart Sounds- S1 WNL and S2 WNL. Murmurs & Other Heart Sounds:Auscultation of the heart reveals - No Murmurs.   Abdomen Palpation/Percussion:Tenderness- Abdomen is non-tender to palpation. Rigidity (guarding)- Abdomen is soft. Auscultation:Auscultation of the abdomen reveals - Bowel sounds normal.   Male Genitourinary Not done, not pertinent to present illness  Musculoskeletal He is alert and oriented in no apparent distress. Both knees show no effusion. His range of motion is about 5 to 120 on each side. Marked crepitus on range of motion of both knees. Tenderness medial greater than lateral, no instability. He has varus  deformities bilateral.  RADIOGRAPHS: X-rays show bone on bone medial and patellofemoral on both sides.   Assessment & Plan Osteoarthritis of both knees (715.96)  Note: Plan is for a Bilateral Total knee Replacements by Dr. [name].  Plan wants to look into [facility] facility following the hospital stay.  PCP - Dr. Teresa Tullo - Patient has been seen preoperatively and felt to be stable for surgery.  The patient does not have any contraindications and will receive TXA (tranexamic acid) prior to surgery.  Signed electronically by Romelia Bromell L Maybelle Depaoli, III PA-C  

## 2012-11-29 ENCOUNTER — Other Ambulatory Visit: Payer: Self-pay | Admitting: Internal Medicine

## 2012-12-01 ENCOUNTER — Encounter (HOSPITAL_COMMUNITY)
Admission: RE | Disposition: A | Discharge status: Rehabilitation Facility | Payer: Self-pay | Source: Ambulatory Visit | Attending: Orthopedic Surgery

## 2012-12-01 ENCOUNTER — Encounter (HOSPITAL_COMMUNITY): Payer: Self-pay | Admitting: *Deleted

## 2012-12-01 ENCOUNTER — Encounter (HOSPITAL_COMMUNITY): Payer: Medicare Other | Admitting: Anesthesiology

## 2012-12-01 ENCOUNTER — Inpatient Hospital Stay (HOSPITAL_COMMUNITY)
Admission: RE | Admit: 2012-12-01 | Discharge: 2012-12-06 | DRG: 462 | Disposition: A | Discharge status: Rehabilitation Facility | Payer: Medicare Other | Source: Ambulatory Visit | Attending: Orthopedic Surgery | Admitting: Orthopedic Surgery

## 2012-12-01 ENCOUNTER — Inpatient Hospital Stay (HOSPITAL_COMMUNITY): Payer: Medicare Other | Admitting: Anesthesiology

## 2012-12-01 DIAGNOSIS — H9190 Unspecified hearing loss, unspecified ear: Secondary | ICD-10-CM | POA: Diagnosis present

## 2012-12-01 DIAGNOSIS — H547 Unspecified visual loss: Secondary | ICD-10-CM | POA: Diagnosis present

## 2012-12-01 DIAGNOSIS — D62 Acute posthemorrhagic anemia: Secondary | ICD-10-CM | POA: Diagnosis not present

## 2012-12-01 DIAGNOSIS — M1710 Unilateral primary osteoarthritis, unspecified knee: Secondary | ICD-10-CM | POA: Diagnosis not present

## 2012-12-01 DIAGNOSIS — M7989 Other specified soft tissue disorders: Secondary | ICD-10-CM | POA: Diagnosis not present

## 2012-12-01 DIAGNOSIS — M179 Osteoarthritis of knee, unspecified: Secondary | ICD-10-CM | POA: Diagnosis present

## 2012-12-01 DIAGNOSIS — K219 Gastro-esophageal reflux disease without esophagitis: Secondary | ICD-10-CM | POA: Diagnosis present

## 2012-12-01 DIAGNOSIS — Z96653 Presence of artificial knee joint, bilateral: Secondary | ICD-10-CM

## 2012-12-01 DIAGNOSIS — R000 Tachycardia, unspecified: Secondary | ICD-10-CM | POA: Diagnosis not present

## 2012-12-01 DIAGNOSIS — Z96659 Presence of unspecified artificial knee joint: Secondary | ICD-10-CM | POA: Diagnosis not present

## 2012-12-01 DIAGNOSIS — Z87891 Personal history of nicotine dependence: Secondary | ICD-10-CM | POA: Diagnosis not present

## 2012-12-01 DIAGNOSIS — Z6831 Body mass index (BMI) 31.0-31.9, adult: Secondary | ICD-10-CM | POA: Diagnosis not present

## 2012-12-01 DIAGNOSIS — J45909 Unspecified asthma, uncomplicated: Secondary | ICD-10-CM | POA: Diagnosis present

## 2012-12-01 DIAGNOSIS — E7800 Pure hypercholesterolemia, unspecified: Secondary | ICD-10-CM | POA: Diagnosis not present

## 2012-12-01 DIAGNOSIS — R339 Retention of urine, unspecified: Secondary | ICD-10-CM

## 2012-12-01 DIAGNOSIS — I498 Other specified cardiac arrhythmias: Secondary | ICD-10-CM | POA: Diagnosis present

## 2012-12-01 DIAGNOSIS — E871 Hypo-osmolality and hyponatremia: Secondary | ICD-10-CM | POA: Diagnosis not present

## 2012-12-01 DIAGNOSIS — IMO0002 Reserved for concepts with insufficient information to code with codable children: Secondary | ICD-10-CM | POA: Diagnosis not present

## 2012-12-01 DIAGNOSIS — M25569 Pain in unspecified knee: Secondary | ICD-10-CM | POA: Diagnosis not present

## 2012-12-01 HISTORY — PX: TOTAL KNEE ARTHROPLASTY: SHX125

## 2012-12-01 LAB — TYPE AND SCREEN
ABO/RH(D): O POS
Antibody Screen: NEGATIVE

## 2012-12-01 SURGERY — ARTHROPLASTY, KNEE, BILATERAL, TOTAL
Anesthesia: Epidural | Site: Knee | Laterality: Bilateral | Wound class: Clean

## 2012-12-01 MED ORDER — DOCUSATE SODIUM 100 MG PO CAPS
100.0000 mg | ORAL_CAPSULE | Freq: Two times a day (BID) | ORAL | Status: DC
  Administered 2012-12-01 – 2012-12-05 (×9): 100 mg via ORAL

## 2012-12-01 MED ORDER — METOCLOPRAMIDE HCL 5 MG/ML IJ SOLN: 5.0000 mg | Freq: Three times a day (TID) | INTRAMUSCULAR | Status: DC | PRN

## 2012-12-01 MED ORDER — BUPIVACAINE HCL (PF) 0.5 % IJ SOLN
INTRAMUSCULAR | Status: AC
  Filled 2012-12-01: qty 30

## 2012-12-01 MED ORDER — PROPOFOL 10 MG/ML IV BOLUS
INTRAVENOUS | Status: AC
  Filled 2012-12-01: qty 20

## 2012-12-01 MED ORDER — SODIUM CHLORIDE 0.9 % IV SOLN
10.0000 mL/h | INTRAVENOUS | Status: DC
  Filled 2012-12-01 (×4): qty 17

## 2012-12-01 MED ORDER — MOMETASONE FURO-FORMOTEROL FUM 100-5 MCG/ACT IN AERO
2.0000 | INHALATION_SPRAY | Freq: Two times a day (BID) | RESPIRATORY_TRACT | Status: DC
  Administered 2012-12-01 – 2012-12-06 (×10): 2 via RESPIRATORY_TRACT
  Filled 2012-12-01: qty 8.8

## 2012-12-01 MED ORDER — EPHEDRINE SULFATE 50 MG/ML IJ SOLN
INTRAMUSCULAR | Status: AC
  Filled 2012-12-01: qty 1

## 2012-12-01 MED ORDER — LACTATED RINGERS IV SOLN: INTRAVENOUS | Status: DC

## 2012-12-01 MED ORDER — SODIUM CHLORIDE 0.9 % IV SOLN: INTRAVENOUS | Status: DC

## 2012-12-01 MED ORDER — METHOCARBAMOL 500 MG PO TABS
500.0000 mg | ORAL_TABLET | Freq: Four times a day (QID) | ORAL | Status: DC | PRN
  Administered 2012-12-02 – 2012-12-06 (×9): 500 mg via ORAL
  Filled 2012-12-01 (×9): qty 1

## 2012-12-01 MED ORDER — PROMETHAZINE HCL 25 MG/ML IJ SOLN: 6.2500 mg | INTRAMUSCULAR | Status: DC | PRN

## 2012-12-01 MED ORDER — PHENYLEPHRINE HCL 10 MG/ML IJ SOLN
20.0000 mg | INTRAVENOUS | Status: DC | PRN
  Administered 2012-12-01: 50 ug/min via INTRAVENOUS

## 2012-12-01 MED ORDER — LIDOCAINE HCL (PF) 2 % IJ SOLN
INTRAMUSCULAR | Status: DC | PRN
  Administered 2012-12-01: 50 mg via INTRADERMAL

## 2012-12-01 MED ORDER — ACETAMINOPHEN 325 MG PO TABS: 650.0000 mg | ORAL_TABLET | Freq: Four times a day (QID) | ORAL | Status: DC | PRN

## 2012-12-01 MED ORDER — PHENYLEPHRINE 40 MCG/ML (10ML) SYRINGE FOR IV PUSH (FOR BLOOD PRESSURE SUPPORT)
PREFILLED_SYRINGE | INTRAVENOUS | Status: AC
  Filled 2012-12-01: qty 10

## 2012-12-01 MED ORDER — LIDOCAINE HCL (CARDIAC) 20 MG/ML IV SOLN
INTRAVENOUS | Status: AC
  Filled 2012-12-01: qty 5

## 2012-12-01 MED ORDER — POLYETHYLENE GLYCOL 3350 17 G PO PACK
17.0000 g | PACK | Freq: Every day | ORAL | Status: DC | PRN
  Administered 2012-12-04: 17 g via ORAL

## 2012-12-01 MED ORDER — BISACODYL 10 MG RE SUPP
10.0000 mg | Freq: Every day | RECTAL | Status: DC | PRN
  Filled 2012-12-01: qty 1

## 2012-12-01 MED ORDER — TRANEXAMIC ACID 100 MG/ML IV SOLN
1000.0000 mg | INTRAVENOUS | Status: AC
  Administered 2012-12-01: 1000 mg via INTRAVENOUS
  Filled 2012-12-01: qty 10

## 2012-12-01 MED ORDER — DIAZEPAM 5 MG PO TABS
5.0000 mg | ORAL_TABLET | Freq: Four times a day (QID) | ORAL | Status: DC | PRN
  Administered 2012-12-02 – 2012-12-04 (×4): 5 mg via ORAL
  Filled 2012-12-01 (×4): qty 1

## 2012-12-01 MED ORDER — ACETAMINOPHEN 500 MG PO TABS
1000.0000 mg | ORAL_TABLET | Freq: Four times a day (QID) | ORAL | Status: AC
  Administered 2012-12-01: 1000 mg via ORAL
  Filled 2012-12-01: qty 2

## 2012-12-01 MED ORDER — DEXTROSE-NACL 5-0.9 % IV SOLN
INTRAVENOUS | Status: DC
  Administered 2012-12-02 – 2012-12-05 (×5): via INTRAVENOUS

## 2012-12-01 MED ORDER — METHOCARBAMOL 100 MG/ML IJ SOLN
500.0000 mg | Freq: Four times a day (QID) | INTRAVENOUS | Status: DC | PRN
  Administered 2012-12-02: 12:00:00 500 mg via INTRAVENOUS
  Filled 2012-12-01: qty 5

## 2012-12-01 MED ORDER — CEFAZOLIN SODIUM-DEXTROSE 2-3 GM-% IV SOLR
2.0000 g | Freq: Four times a day (QID) | INTRAVENOUS | Status: AC
  Administered 2012-12-01 – 2012-12-02 (×2): 2 g via INTRAVENOUS
  Filled 2012-12-01 (×2): qty 50

## 2012-12-01 MED ORDER — DICYCLOMINE HCL 10 MG PO CAPS
10.0000 mg | ORAL_CAPSULE | Freq: Three times a day (TID) | ORAL | Status: DC
  Administered 2012-12-01 – 2012-12-06 (×19): 10 mg via ORAL
  Filled 2012-12-01 (×23): qty 1

## 2012-12-01 MED ORDER — PANTOPRAZOLE SODIUM 20 MG PO TBEC
20.0000 mg | DELAYED_RELEASE_TABLET | Freq: Every day | ORAL | Status: DC
  Administered 2012-12-01: 20 mg via ORAL
  Filled 2012-12-01 (×2): qty 1

## 2012-12-01 MED ORDER — CEFAZOLIN SODIUM-DEXTROSE 2-3 GM-% IV SOLR
INTRAVENOUS | Status: AC
  Filled 2012-12-01: qty 50

## 2012-12-01 MED ORDER — MIDAZOLAM HCL 5 MG/5ML IJ SOLN
INTRAMUSCULAR | Status: DC | PRN
  Administered 2012-12-01: 2 mg via INTRAVENOUS

## 2012-12-01 MED ORDER — PHENOL 1.4 % MT LIQD: 1.0000 | OROMUCOSAL | Status: DC | PRN

## 2012-12-01 MED ORDER — LIDOCAINE-EPINEPHRINE 2 %-1:100000 IJ SOLN
INTRAMUSCULAR | Status: AC
  Filled 2012-12-01: qty 1

## 2012-12-01 MED ORDER — METOCLOPRAMIDE HCL 10 MG PO TABS: 5.0000 mg | ORAL_TABLET | Freq: Three times a day (TID) | ORAL | Status: DC | PRN

## 2012-12-01 MED ORDER — MENTHOL 3 MG MT LOZG: 1.0000 | LOZENGE | OROMUCOSAL | Status: DC | PRN

## 2012-12-01 MED ORDER — BUPIVACAINE LIPOSOME 1.3 % IJ SUSP
20.0000 mL | Freq: Once | INTRAMUSCULAR | Status: DC
  Filled 2012-12-01: qty 20

## 2012-12-01 MED ORDER — DEXAMETHASONE SODIUM PHOSPHATE 10 MG/ML IJ SOLN
INTRAMUSCULAR | Status: AC
  Filled 2012-12-01: qty 1

## 2012-12-01 MED ORDER — FLEET ENEMA 7-19 GM/118ML RE ENEM: 1.0000 | ENEMA | Freq: Once | RECTAL | Status: AC | PRN

## 2012-12-01 MED ORDER — ACETAMINOPHEN 650 MG RE SUPP: 650.0000 mg | Freq: Four times a day (QID) | RECTAL | Status: DC | PRN

## 2012-12-01 MED ORDER — KETAMINE HCL 50 MG/ML IJ SOLN
INTRAMUSCULAR | Status: AC
  Filled 2012-12-01: qty 10

## 2012-12-01 MED ORDER — ONDANSETRON HCL 4 MG/2ML IJ SOLN
INTRAMUSCULAR | Status: DC | PRN
  Administered 2012-12-01: 4 mg via INTRAVENOUS

## 2012-12-01 MED ORDER — PHENYLEPHRINE HCL 10 MG/ML IJ SOLN
INTRAMUSCULAR | Status: DC | PRN
  Administered 2012-12-01 (×2): 80 ug via INTRAVENOUS

## 2012-12-01 MED ORDER — LACTATED RINGERS IV SOLN
INTRAVENOUS | Status: DC | PRN
  Administered 2012-12-01 (×2): via INTRAVENOUS

## 2012-12-01 MED ORDER — ACETAMINOPHEN 10 MG/ML IV SOLN
1000.0000 mg | INTRAVENOUS | Status: AC
  Administered 2012-12-01: 1000 mg via INTRAVENOUS
  Filled 2012-12-01: qty 100

## 2012-12-01 MED ORDER — ONDANSETRON HCL 4 MG PO TABS: 4.0000 mg | ORAL_TABLET | Freq: Four times a day (QID) | ORAL | Status: DC | PRN

## 2012-12-01 MED ORDER — CEFAZOLIN SODIUM-DEXTROSE 2-3 GM-% IV SOLR
2.0000 g | INTRAVENOUS | Status: AC
  Administered 2012-12-01: 2 g via INTRAVENOUS

## 2012-12-01 MED ORDER — BUPIVACAINE HCL (PF) 0.25 % IJ SOLN
INTRAMUSCULAR | Status: AC
  Filled 2012-12-01: qty 30

## 2012-12-01 MED ORDER — STERILE WATER FOR IRRIGATION IR SOLN
Status: DC | PRN
  Administered 2012-12-01: 3000 mL

## 2012-12-01 MED ORDER — SODIUM CHLORIDE 0.9 % IR SOLN
Status: DC | PRN
  Administered 2012-12-01: 1000 mL

## 2012-12-01 MED ORDER — OXYCODONE HCL 5 MG PO TABS
5.0000 mg | ORAL_TABLET | ORAL | Status: DC | PRN
  Administered 2012-12-01 – 2012-12-02 (×3): 10 mg via ORAL
  Administered 2012-12-02: 20 mg via ORAL
  Administered 2012-12-02: 15 mg via ORAL
  Administered 2012-12-02 – 2012-12-06 (×23): 20 mg via ORAL
  Filled 2012-12-01 (×3): qty 4
  Filled 2012-12-01: qty 1
  Filled 2012-12-01 (×3): qty 4
  Filled 2012-12-01: qty 2
  Filled 2012-12-01 (×2): qty 4
  Filled 2012-12-01: qty 2
  Filled 2012-12-01: qty 1
  Filled 2012-12-01 (×7): qty 4
  Filled 2012-12-01: qty 1
  Filled 2012-12-01 (×9): qty 4
  Filled 2012-12-01: qty 2

## 2012-12-01 MED ORDER — MIDAZOLAM HCL 2 MG/2ML IJ SOLN
INTRAMUSCULAR | Status: AC
  Filled 2012-12-01: qty 2

## 2012-12-01 MED ORDER — DEXAMETHASONE SODIUM PHOSPHATE 10 MG/ML IJ SOLN
INTRAMUSCULAR | Status: DC | PRN
  Administered 2012-12-01: 10 mg via INTRAVENOUS

## 2012-12-01 MED ORDER — FENTANYL CITRATE 0.05 MG/ML IJ SOLN: 25.0000 ug | INTRAMUSCULAR | Status: DC | PRN

## 2012-12-01 MED ORDER — DIPHENHYDRAMINE HCL 12.5 MG/5ML PO ELIX: 12.5000 mg | ORAL_SOLUTION | ORAL | Status: DC | PRN

## 2012-12-01 MED ORDER — DEXAMETHASONE SODIUM PHOSPHATE 10 MG/ML IJ SOLN
10.0000 mg | Freq: Every day | INTRAMUSCULAR | Status: AC
  Administered 2012-12-02: 10 mg via INTRAVENOUS
  Filled 2012-12-01: qty 1

## 2012-12-01 MED ORDER — GLYCOPYRROLATE 0.2 MG/ML IJ SOLN
INTRAMUSCULAR | Status: DC | PRN
  Administered 2012-12-01: 0.2 mg via INTRAVENOUS

## 2012-12-01 MED ORDER — SODIUM CHLORIDE 0.9 % IJ SOLN
INTRAMUSCULAR | Status: AC
  Filled 2012-12-01: qty 50

## 2012-12-01 MED ORDER — SODIUM CHLORIDE 0.9 % IV SOLN
INTRAVENOUS | Status: DC
  Administered 2012-12-01 – 2012-12-03 (×4): via EPIDURAL
  Filled 2012-12-01 (×9): qty 20

## 2012-12-01 MED ORDER — SODIUM CHLORIDE 0.9 % IR SOLN
Status: DC | PRN
  Administered 2012-12-01: 2000 mL

## 2012-12-01 MED ORDER — BUPIVACAINE HCL (PF) 0.5 % IJ SOLN
INTRAMUSCULAR | Status: DC | PRN
  Administered 2012-12-01: 5 mL
  Administered 2012-12-01: 5 mL via EPIDURAL
  Administered 2012-12-01 (×3): 5 mL
  Administered 2012-12-01: 5 mL via EPIDURAL

## 2012-12-01 MED ORDER — PROPOFOL INFUSION 10 MG/ML OPTIME
INTRAVENOUS | Status: DC | PRN
  Administered 2012-12-01: 100 ug/kg/min via INTRAVENOUS

## 2012-12-01 MED ORDER — TRAMADOL HCL 50 MG PO TABS: 50.0000 mg | ORAL_TABLET | Freq: Four times a day (QID) | ORAL | Status: DC | PRN

## 2012-12-01 MED ORDER — DEXAMETHASONE SODIUM PHOSPHATE 10 MG/ML IJ SOLN: 10.0000 mg | Freq: Once | INTRAMUSCULAR | Status: DC

## 2012-12-01 MED ORDER — GLYCOPYRROLATE 0.2 MG/ML IJ SOLN
INTRAMUSCULAR | Status: AC
  Filled 2012-12-01: qty 1

## 2012-12-01 MED ORDER — PHENYLEPHRINE HCL 10 MG/ML IJ SOLN
INTRAMUSCULAR | Status: AC
  Filled 2012-12-01: qty 1

## 2012-12-01 MED ORDER — SODIUM CHLORIDE 0.9 % IJ SOLN
INTRAMUSCULAR | Status: AC
  Filled 2012-12-01: qty 10

## 2012-12-01 MED ORDER — FENTANYL CITRATE 0.05 MG/ML IJ SOLN
INTRAMUSCULAR | Status: AC
  Filled 2012-12-01: qty 2

## 2012-12-01 MED ORDER — ONDANSETRON HCL 4 MG/2ML IJ SOLN: 4.0000 mg | Freq: Four times a day (QID) | INTRAMUSCULAR | Status: DC | PRN

## 2012-12-01 MED ORDER — MORPHINE SULFATE 2 MG/ML IJ SOLN
1.0000 mg | INTRAMUSCULAR | Status: DC | PRN
  Administered 2012-12-03 – 2012-12-04 (×4): 2 mg via INTRAVENOUS
  Filled 2012-12-01 (×4): qty 1

## 2012-12-01 MED ORDER — FENTANYL CITRATE 0.05 MG/ML IJ SOLN
INTRAMUSCULAR | Status: DC | PRN
  Administered 2012-12-01 (×2): 50 ug via INTRAVENOUS

## 2012-12-01 MED ORDER — EPHEDRINE SULFATE 50 MG/ML IJ SOLN
INTRAMUSCULAR | Status: DC | PRN
  Administered 2012-12-01: 10 mg via INTRAVENOUS
  Administered 2012-12-01: 5 mg via INTRAVENOUS

## 2012-12-01 MED ORDER — ONDANSETRON HCL 4 MG/2ML IJ SOLN
INTRAMUSCULAR | Status: AC
  Filled 2012-12-01: qty 2

## 2012-12-01 MED ORDER — MEPERIDINE HCL 50 MG/ML IJ SOLN: 6.2500 mg | INTRAMUSCULAR | Status: DC | PRN

## 2012-12-01 MED ORDER — DEXAMETHASONE 6 MG PO TABS
10.0000 mg | ORAL_TABLET | Freq: Every day | ORAL | Status: AC
  Filled 2012-12-01: qty 1

## 2012-12-01 SURGICAL SUPPLY — 54 items
BAG SPEC THK2 15X12 ZIP CLS (MISCELLANEOUS) ×2
BAG ZIPLOCK 12X15 (MISCELLANEOUS) ×4 IMPLANT
BANDAGE ELASTIC 6 VELCRO ST LF (GAUZE/BANDAGES/DRESSINGS) ×4 IMPLANT
BANDAGE ESMARK 6X9 LF (GAUZE/BANDAGES/DRESSINGS) ×2 IMPLANT
BLADE SAG 18X100X1.27 (BLADE) ×2 IMPLANT
BLADE SAW SGTL 11.0X1.19X90.0M (BLADE) ×2 IMPLANT
BLADE SURG SZ10 CARB STEEL (BLADE) ×4 IMPLANT
BNDG CMPR 9X6 STRL LF SNTH (GAUZE/BANDAGES/DRESSINGS) ×2
BNDG COHESIVE 6X5 TAN STRL LF (GAUZE/BANDAGES/DRESSINGS) ×2 IMPLANT
BNDG ESMARK 6X9 LF (GAUZE/BANDAGES/DRESSINGS) ×4
BOWL SMART MIX CTS (DISPOSABLE) ×4 IMPLANT
CAPT RP KNEE ×2 IMPLANT
CEMENT HV SMART SET (Cement) ×8 IMPLANT
CUFF TOURN SGL QUICK 34 (TOURNIQUET CUFF) ×4
CUFF TRNQT CYL 34X4X40X1 (TOURNIQUET CUFF) ×2 IMPLANT
DRAPE EXTREMITY BILATERAL (DRAPE) ×2 IMPLANT
DRAPE INCISE IOBAN 66X45 STRL (DRAPES) ×2 IMPLANT
DRAPE POUCH INSTRU U-SHP 10X18 (DRAPES) ×2 IMPLANT
DRAPE U-SHAPE 47X51 STRL (DRAPES) ×6 IMPLANT
DRSG ADAPTIC 3X8 NADH LF (GAUZE/BANDAGES/DRESSINGS) ×4 IMPLANT
DRSG PAD ABDOMINAL 8X10 ST (GAUZE/BANDAGES/DRESSINGS) ×4 IMPLANT
DURAPREP 26ML APPLICATOR (WOUND CARE) ×4 IMPLANT
ELECT REM PT RETURN 9FT ADLT (ELECTROSURGICAL) ×2
ELECTRODE REM PT RTRN 9FT ADLT (ELECTROSURGICAL) ×1 IMPLANT
EVACUATOR 1/8 PVC DRAIN (DRAIN) ×4 IMPLANT
FACESHIELD LNG OPTICON STERILE (SAFETY) ×16 IMPLANT
GLOVE BIO SURGEON STRL SZ7.5 (GLOVE) ×4 IMPLANT
GLOVE BIO SURGEON STRL SZ8 (GLOVE) ×4 IMPLANT
GLOVE BIOGEL PI IND STRL 8 (GLOVE) ×2 IMPLANT
GLOVE BIOGEL PI INDICATOR 8 (GLOVE) ×2
GOWN PREVENTION PLUS LG XLONG (DISPOSABLE) ×2 IMPLANT
GOWN STRL REIN XL XLG (GOWN DISPOSABLE) ×2 IMPLANT
HANDPIECE INTERPULSE COAX TIP (DISPOSABLE) ×2
IMMOBILIZER KNEE 20 (SOFTGOODS) ×4
IMMOBILIZER KNEE 20 THIGH 36 (SOFTGOODS) ×2 IMPLANT
KIT BASIN OR (CUSTOM PROCEDURE TRAY) ×2 IMPLANT
MANIFOLD NEPTUNE II (INSTRUMENTS) ×2 IMPLANT
NS IRRIG 1000ML POUR BTL (IV SOLUTION) ×2 IMPLANT
PACK TOTAL JOINT (CUSTOM PROCEDURE TRAY) ×2 IMPLANT
PADDING CAST COTTON 6X4 STRL (CAST SUPPLIES) ×9 IMPLANT
SET HNDPC FAN SPRY TIP SCT (DISPOSABLE) ×1 IMPLANT
SPONGE GAUZE 4X4 12PLY (GAUZE/BANDAGES/DRESSINGS) ×3 IMPLANT
SPONGE LAP 18X18 X RAY DECT (DISPOSABLE) ×2 IMPLANT
STOCKINETTE 8 INCH (MISCELLANEOUS) ×2 IMPLANT
STRIP CLOSURE SKIN 1/2X4 (GAUZE/BANDAGES/DRESSINGS) ×6 IMPLANT
SUCTION FRAZIER 12FR DISP (SUCTIONS) ×2 IMPLANT
SUT MNCRL AB 4-0 PS2 18 (SUTURE) ×4 IMPLANT
SUT VIC AB 2-0 CT1 27 (SUTURE) ×12
SUT VIC AB 2-0 CT1 TAPERPNT 27 (SUTURE) ×6 IMPLANT
SUT VLOC 180 0 24IN GS25 (SUTURE) ×4 IMPLANT
TOWEL OR 17X26 10 PK STRL BLUE (TOWEL DISPOSABLE) ×4 IMPLANT
TRAY FOLEY CATH 14FRSI W/METER (CATHETERS) ×2 IMPLANT
WATER STERILE IRR 1500ML POUR (IV SOLUTION) ×4 IMPLANT
WRAP KNEE MAXI GEL POST OP (GAUZE/BANDAGES/DRESSINGS) ×4 IMPLANT

## 2012-12-01 NOTE — H&P (View-Only) (Signed)
Nathan Ramirez  DOB: 07-Jan-1946 Married / Language: English / Race: White Male  Date of Admission:  12/01/2012  Chief Complaint:  Bilateral Knee Pain  History of Present Illness The patient is a 67 year old male who comes in for a preoperative History and Physical. The patient is scheduled for a bilateral total knee arthroplasty to be performed by Dr. Gus Rankin. [name], MD at [facility] on 12/01/2012. The patient is a 67 year old male who presents today for follow up of their knee. The patient is being followed for their bilateral knee pain and osteoarthritis. They are now almost 2 years out from last office visit. Symptoms reported today include: pain, aching, stiffness, grinding, giving way and instability. The patient feels that they are doing poorly. Current treatment includes: NSAIDs (diclofenac). The following medication has been used for pain control: tramadol. The patient indicates that they have questions or concerns today regarding surgery. The knees are getting progressively worse. They are hurting at all times. They are limiting what he can and cannot do. He is retired now and can't get around at all. He is ready to get both knees replaced. They have been treated conservatively in the past for the above stated problem and despite conservative measures, they continue to have progressive pain and severe functional limitations and dysfunction. They have failed non-operative management including home exercise, medications, and injections. It is felt that they would benefit from undergoing bilateral total joint replacements. Risks and benefits of the procedure have been discussed with the patient and they elect to proceed with surgery. There are no active contraindications to surgery such as ongoing infection or rapidly progressive neurological disease.    Problem List Osteoarthritis of both knees (715.96). 07/11/2010    Allergies No Known Drug  Allergies    Family History Father. Deceased. Mother. COPD.    Social History Tobacco use. Former smoker. quit over 10 years ago Alcohol use. Occasional alcohol use. Beer Children. 2 Living situation. Lives with spouse. Post-Surgical Plans. Wants to look into [facility].    Medication History Diclofenac Sodium ( Oral) Specific dose unknown - Active. Prevacid (15MG  Capsule DR, Oral) Active. Advair Diskus (100-50MCG/DOSE Aero Pow Br Act, Inhalation) Active. Ibuprofen (200MG  Capsule, 1 Oral) Active. TraMADol HCl (50MG  Tablet, Oral) Active. Multiple Vitamin (1 Oral) Active. Dicyclomine HCl ( Oral) Specific dose unknown - Active.    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 07/25/2010 Impaired Vision Impaired Hearing Asthma Hypercholesterolemia Gastroesophageal Reflux Disease    Review of Systems General:Not Present- Chills, Fever, Night Sweats, Fatigue, Weight Gain, Weight Loss and Memory Loss. Skin:Not Present- Hives, Itching, Rash, Eczema and Lesions. HEENT:Not Present- Tinnitus, Headache, Double Vision, Visual Loss, Hearing Loss and Dentures. Respiratory:Not Present- Shortness of breath with exertion, Shortness of breath at rest, Allergies, Coughing up blood and Chronic Cough. Cardiovascular:Not Present- Chest Pain, Racing/skipping heartbeats, Difficulty Breathing Lying Down, Murmur, Swelling and Palpitations. Gastrointestinal:Not Present- Bloody Stool, Heartburn, Abdominal Pain, Vomiting, Nausea, Constipation, Diarrhea, Difficulty Swallowing, Jaundice and Loss of appetitie. Male Genitourinary:Not Present- Urinary frequency, Blood in Urine, Weak urinary stream, Discharge, Flank Pain, Incontinence, Painful Urination, Urgency, Urinary Retention and Urinating at Night. Musculoskeletal:Not  Present- Muscle Weakness, Muscle Pain, Joint Swelling, Joint Pain, Back Pain, Morning Stiffness and Spasms. Neurological:Not Present- Tremor, Dizziness, Blackout spells, Paralysis, Difficulty with balance and Weakness. Psychiatric:Not Present- Insomnia.    Vitals Pulse:  64 (Regular) Resp.: 14 (Unlabored) BP: 112/68 (Sitting, Right Arm, Standard)     Physical Exam The physical exam findings are as follows:  Note: Patient is a 67 year old male with continued bilateral knee pain. Patient is accompanied today by his wife on exam.   General Mental Status - Alert, cooperative and good historian. General Appearance- pleasant. Not in acute distress. Orientation- Oriented X3. Build & Nutrition- Well nourished and Well developed.   Head and Neck Head- normocephalic, atraumatic . Neck Global Assessment- supple. no bruit auscultated on the right and no bruit auscultated on the left.   Eye Vision- Wears corrective lenses. Pupil- Bilateral- Regular and Round. Motion- Bilateral- EOMI.   Chest and Lung Exam Auscultation: Breath sounds:- clear at anterior chest wall and - clear at posterior chest wall. Adventitious sounds:- No Adventitious sounds.   Cardiovascular Auscultation:Rhythm- Regular rate and rhythm. Heart Sounds- S1 WNL and S2 WNL. Murmurs & Other Heart Sounds:Auscultation of the heart reveals - No Murmurs.   Abdomen Palpation/Percussion:Tenderness- Abdomen is non-tender to palpation. Rigidity (guarding)- Abdomen is soft. Auscultation:Auscultation of the abdomen reveals - Bowel sounds normal.   Male Genitourinary Not done, not pertinent to present illness  Musculoskeletal He is alert and oriented in no apparent distress. Both knees show no effusion. His range of motion is about 5 to 120 on each side. Marked crepitus on range of motion of both knees. Tenderness medial greater than lateral, no instability. He has varus  deformities bilateral.  RADIOGRAPHS: X-rays show bone on bone medial and patellofemoral on both sides.   Assessment & Plan Osteoarthritis of both knees (715.96)  Note: Plan is for a Bilateral Total knee Replacements by Dr. Lequita Halt.  Plan wants to look into [facility] following the hospital stay.  PCP - Dr. Duncan Dull - Patient has been seen preoperatively and felt to be stable for surgery.  The patient does not have any contraindications and will receive TXA (tranexamic acid) prior to surgery.  Signed electronically by Lauraine Rinne, III PA-C

## 2012-12-01 NOTE — Interval H&P Note (Signed)
History and Physical Interval Note:  12/01/2012 1:29 PM  Nathan Ramirez  has presented today for surgery, with the diagnosis of Bilateral Knees Osteoarthritis  The various methods of treatment have been discussed with the patient and family. After consideration of risks, benefits and other options for treatment, the patient has consented to  Procedure(s): TOTAL KNEE BILATERAL (Bilateral) as a surgical intervention .  The patient's history has been reviewed, patient examined, no change in status, stable for surgery.  I have reviewed the patient's chart and labs.  Questions were answered to the patient's satisfaction.     Loanne Drilling

## 2012-12-01 NOTE — Progress Notes (Signed)
Sacral Dressing deferred by Dr Roxanna Mew MD due to placement of Epidural.

## 2012-12-01 NOTE — Plan of Care (Signed)
Problem: Consults Goal: Diagnosis- Total Joint Replacement Primary Total Knee   Bilateral total knees

## 2012-12-01 NOTE — Anesthesia Preprocedure Evaluation (Signed)
Anesthesia Evaluation  Patient identified by MRN, date of birth, ID band Patient awake    Reviewed: Allergy & Precautions, H&P , NPO status , Patient's Chart, lab work & pertinent test results  History of Anesthesia Complications (+) PONV  Airway Mallampati: II TM Distance: >3 FB Neck ROM: Full    Dental no notable dental hx.    Pulmonary neg pulmonary ROS, former smoker,  breath sounds clear to auscultation  Pulmonary exam normal       Cardiovascular negative cardio ROS  Rhythm:Regular Rate:Normal     Neuro/Psych negative neurological ROS  negative psych ROS   GI/Hepatic negative GI ROS, Neg liver ROS,   Endo/Other  negative endocrine ROS  Renal/GU negative Renal ROS  negative genitourinary   Musculoskeletal negative musculoskeletal ROS (+)   Abdominal   Peds negative pediatric ROS (+)  Hematology negative hematology ROS (+)   Anesthesia Other Findings   Reproductive/Obstetrics negative OB ROS                           Anesthesia Physical Anesthesia Plan  ASA: II  Anesthesia Plan: Epidural   Post-op Pain Management: MAC Combined w/ Regional for Post-op pain   Induction:   Airway Management Planned: Simple Face Mask  Additional Equipment:   Intra-op Plan:   Post-operative Plan:   Informed Consent: I have reviewed the patients History and Physical, chart, labs and discussed the procedure including the risks, benefits and alternatives for the proposed anesthesia with the patient or authorized representative who has indicated his/her understanding and acceptance.   Dental advisory given  Plan Discussed with: CRNA  Anesthesia Plan Comments:         Anesthesia Quick Evaluation

## 2012-12-01 NOTE — Transfer of Care (Signed)
Immediate Anesthesia Transfer of Care Note  Patient: Nathan Ramirez  Procedure(s) Performed: Procedure(s) (LRB): TOTAL KNEE BILATERAL (Bilateral)  Patient Location: PACU  Anesthesia Type: Regional, Epidural and MAC combined with regional for post-op pain  Level of Consciousness: sedated, patient cooperative and responds to stimulation  Airway & Oxygen Therapy: Patient Spontanous Breathing and Patient connected to face mask oxgen  Post-op Assessment: Report given to PACU RN and Post -op Vital signs reviewed and stable  Post vital signs: Reviewed and stable  Complications: No apparent anesthesia complications

## 2012-12-01 NOTE — Progress Notes (Signed)
Dr. Kieth Brightly (anes.) visited patient. Resumed epidural infusion (bupivacaine 0.1% in 0.9 NS) @ 10cc/hr rate per Dr. Lorette Ang verbal instruction.

## 2012-12-01 NOTE — Anesthesia Postprocedure Evaluation (Addendum)
  Anesthesia Post-op Note  Patient: Nathan Ramirez  Procedure(s) Performed: Procedure(s) (LRB): TOTAL KNEE BILATERAL (Bilateral)  Patient Location: PACU  Anesthesia Type: Epidural  Level of Consciousness: awake and alert   Airway and Oxygen Therapy: Patient Spontanous Breathing  Post-op Pain: mild  Post-op Assessment: Post-op Vital signs reviewed, Patient's Cardiovascular Status Stable, Respiratory Function Stable, Patent Airway and No signs of Nausea or vomiting  Last Vitals:  Filed Vitals:   12/01/12 1800  BP: 100/60  Pulse:   Temp:   Resp:     Post-op Vital Signs: stable   Complications: No apparent anesthesia complications  No motor recory as of yet. Floor instructed to call anesthesia if no LE motor recovery by 8pm

## 2012-12-01 NOTE — Anesthesia Procedure Notes (Signed)
Epidural Patient location during procedure: holding area  Staffing Anesthesiologist: Phillips Grout Performed by: anesthesiologist   Preanesthetic Checklist Completed: patient identified, site marked, surgical consent, pre-op evaluation, timeout performed, IV checked, risks and benefits discussed, monitors and equipment checked and post-op pain management  Epidural Patient position: sitting Prep: Betadine Patient monitoring: heart rate, continuous pulse ox and blood pressure Approach: right paramedian Injection technique: LOR saline  Needle:  Needle type: Hustead  Needle gauge: 18 G Needle length: 9 cm and 9 Needle insertion depth: 7 cm Catheter type: closed end flexible Catheter size: 20 Guage Catheter at skin depth: 13 cm Test dose: negative and 1.5% lidocaine  Assessment Events: blood not aspirated, injection not painful, no injection resistance, negative IV test and no paresthesia  Additional Notes Test dose 5cc 1.5% Lidocaine with epi 1:200,000  Patient tolerated the insertion well without complications.Reason for block:post-op pain management

## 2012-12-01 NOTE — Op Note (Signed)
Pre-operative diagnosis- Osteoarthritis  Bilateral knee(s)  Post-operative diagnosis- Osteoarthritis Bilateral knee(s)  Procedure-  Bilateral  Total Knee Arthroplasty  Surgeon- Gus Rankin. Javian Nudd, MD  Assistant- Avel Peace, PA-C   Anesthesia-  Epidural  EBL-* No blood loss amount entered *   Drains Hemovac x 1 each side  Tourniquet time-  Total Tourniquet Time Documented: Thigh (Left) - 34 minutes Total: Thigh (Left) - 34 minutes  Thigh (Right) - 37 minutes Total: Thigh (Right) - 37 minutes    Complications- None  Condition-PACU - hemodynamically stable.   Brief Clinical Note  Nathan Ramirez is a 67 y.o. year old male with end stage OA of both knees with progressively worsening pain and dysfunction. He has constant pain, with activity and at rest and significant functional deficits with difficulties even with ADLs. He has had extensive non-op management including analgesics, injections of cortisone and viscosupplements, and home exercise program, but remains in significant pain with significant dysfunction. Radiographs show bone on bone arthritis medial and patellofemoral compartments of both knees. We discussed doing both knees in the same setting versus one at a time including the procedure, risks, potential complications and rehab course associated with each and he elected for bilateral TKA. He presents now for bilateral Total Knee Arthroplasty.    Procedure in detail---   The patient is brought into the operating room and positioned supine on the operating table. After successful administration of  Epidural,   a tourniquet is placed high on the  Bilateral thigh(s) and the lower extremities are prepped and draped in the usual sterile fashion. Time out is performed by the operating team and then the  Left lower extremity is wrapped in Esmarch, knee flexed and the tourniquet inflated to 300 mmHg.       A midline incision is made with a ten blade through the subcutaneous tissue to  the level of the extensor mechanism. A fresh blade is used to make a medial parapatellar arthrotomy. Soft tissue over the proximal medial tibia is subperiosteally elevated to the joint line with a knife and into the semimembranosus bursa with a Cobb elevator. Soft tissue over the proximal lateral tibia is elevated with attention being paid to avoiding the patellar tendon on the tibial tubercle. The patella is everted, knee flexed 90 degrees and the ACL and PCL are removed. Findings are bone on bone medial and patellofemoral with large medial and patellar osteophytes.        The drill is used to create a starting hole in the distal femur and the canal is thoroughly irrigated with sterile saline to remove the fatty contents. The 5 degree Left  valgus alignment guide is placed into the femoral canal and the distal femoral cutting block is pinned to remove 10 mm off the distal femur. Resection is made with an oscillating saw.      The tibia is subluxed forward and the menisci are removed. The extramedullary alignment guide is placed referencing proximally at the medial aspect of the tibial tubercle and distally along the second metatarsal axis and tibial crest. The block is pinned to remove 2mm off the more deficient medial  side. Resection is made with an oscillating saw. Size 5is the most appropriate size for the tibia and the proximal tibia is prepared with the modular drill and keel punch for that size.      The femoral sizing guide is placed and size 5 is most appropriate. Rotation is marked off the epicondylar axis and confirmed by  creating a rectangular flexion gap at 90 degrees. The size 5 cutting block is pinned in this rotation and the anterior, posterior and chamfer cuts are made with the oscillating saw. The intercondylar block is then placed and that cut is made.      Trial size 5 tibial component, trial size 5 posterior stabilized femur and a 12.5  mm posterior stabilized rotating platform insert trial  is placed. Full extension is achieved with excellent varus/valgus and anterior/posterior balance throughout full range of motion. The patella is everted and thickness measured to be 27  mm. Free hand resection is taken to 15 mm, a 41 template is placed, lug holes are drilled, trial patella is placed, and it tracks normally. Osteophytes are removed off the posterior femur with the trial in place. All trials are removed and the cut bone surfaces prepared with pulsatile lavage. Cement is mixed and once ready for implantation, the size 5 tibial implant, size  5 posterior stabilized femoral component, and the size 41 patella are cemented in place and the patella is held with the clamp. The trial insert is placed and the knee held in full extension. The Exparel (20 ml mixed with 30 ml saline) and .25% Bupivicaine, are injected into the extensor mechanism, posterior capsule, medial and lateral gutters and subcutaneous tissues.  All extruded cement is removed and once the cement is hard the permanent 12.5 mm posterior stabilized rotating platform insert is placed into the tibial tray.      The wound is copiously irrigated with saline solution and the extensor mechanism closed over a hemovac drain with #1 V-loc suture. The tourniquet is released for a total tourniquet time of 34  minutes. Flexion against gravity is 140 degrees and the patella tracks normally. Subcutaneous tissue is closed with 2.0 vicryl and subcuticular with running 4.0 Monocryl.        The  Right lower extremity is wrapped in Esmarch, knee flexed and the tourniquet inflated to 300 mmHg.       A midline incision is made with a ten blade through the subcutaneous tissue to the level of the extensor mechanism. A fresh blade is used to make a medial parapatellar arthrotomy. Soft tissue over the proximal medial tibia is subperiosteally elevated to the joint line with a knife and into the semimembranosus bursa with a Cobb elevator. Soft tissue over the  proximal lateral tibia is elevated with attention being paid to avoiding the patellar tendon on the tibial tubercle. The patella is everted, knee flexed 90 degrees and the ACL and PCL are removed. Findings are  bone on bone medial and patellofemoral with large medial and patellar osteophytes.          The drill is used to create a starting hole in the distal femur and the canal is thoroughly irrigated with sterile saline to remove the fatty contents. The 5 degree Right  valgus alignment guide is placed into the femoral canal and the distal femoral cutting block is pinned to remove 10 mm off the distal femur. Resection is made with an oscillating saw.      The tibia is subluxed forward and the menisci are removed. The extramedullary alignment guide is placed referencing proximally at the medial aspect of the tibial tubercle and distally along the second metatarsal axis and tibial crest. The block is pinned to remove 2mm off the more deficient medial  side. Resection is made with an oscillating saw. Size 5is the most appropriate size for the tibia  and the proximal tibia is prepared with the modular drill and keel punch for that size.      The femoral sizing guide is placed and size 5 is most appropriate. Rotation is marked off the epicondylar axis and confirmed by creating a rectangular flexion gap at 90 degrees. The size 5 cutting block is pinned in this rotation and the anterior, posterior and chamfer cuts are made with the oscillating saw. The intercondylar block is then placed and that cut is made.      Trial size 5 tibial component, trial size 5 posterior stabilized femur and a 10  mm posterior stabilized rotating platform insert trial is placed. Full extension is achieved with excellent varus/valgus and anterior/posterior balance throughout full range of motion. The patella is everted and thickness measured to be 27  mm. Free hand resection is taken to 15 mm, a 41 template is placed, lug holes are drilled,  trial patella is placed, and it tracks normally. Osteophytes are removed off the posterior femur with the trial in place. All trials are removed and the cut bone surfaces prepared with pulsatile lavage. Cement is mixed and once ready for implantation, the size 5 tibial implant, size  5 posterior stabilized femoral component, and the size 41 patella are cemented in place and the patella is held with the clamp. The trial insert is placed and the knee held in full extension. The Exparel (20 ml mixed with 30 ml saline) and .25% Bupivicaine, are injected into the extensor mechanism, posterior capsule, medial and lateral gutters and subcutaneous tissues.  All extruded cement is removed and once the cement is hard the permanent 10 mm posterior stabilized rotating platform insert is placed into the tibial tray.      The wound is copiously irrigated with saline solution and the extensor mechanism closed over a hemovac drain with #1 PDS suture. The tourniquet is released for a total tourniquet time of 37  minutes. Flexion against gravity is 140 degrees and the patella tracks normally. Subcutaneous tissue is closed with 2.0 vicryl and subcuticular with running 4.0 Monocryl. The incisions are cleaned and dried and steri-strips and  bulky sterile dressings are applied. The limbs are placed into knee immobilizers and the patient is awakened and transported to recovery in stable condition.       Please note that a surgical assistant was a medical necessity for this procedure in order to perform it in a safe and expeditious manner. Surgical assistant was necessary to retract the ligaments and vital neurovascular structures to prevent injury to them and also necessary for proper positioning of the limb to allow for anatomic placement of the prosthesis.   Gus Rankin Paula Zietz, MD    12/01/2012, 4:24 PM

## 2012-12-01 NOTE — Anesthesia Postprocedure Evaluation (Signed)
Anesthesia Post Note  Patient: Nathan Ramirez  Procedure(s) Performed: Procedure(s) (LRB): TOTAL KNEE BILATERAL (Bilateral)  Anesthesia type: Epidural  Patient location: 6109  Post pain: Pain level controlled  Post assessment: Post-op Vital signs reviewed  Last Vitals:  Filed Vitals:   12/01/12 1951  BP: 114/76  Pulse: 86  Temp: 36.3 C  Resp: 15    Post vital signs: Reviewed  Level of consciousness: awake  Complications: No apparent anesthesia complications. Motor block has completely resolved. After talking with Dr. Despina Hick, he is ok with resumption of the postop epidural infusion.

## 2012-12-01 NOTE — Preoperative (Signed)
Beta Blockers   Reason not to administer Beta Blockers:Not Applicable 

## 2012-12-02 ENCOUNTER — Encounter (HOSPITAL_COMMUNITY): Payer: Self-pay | Admitting: Orthopedic Surgery

## 2012-12-02 DIAGNOSIS — M1710 Unilateral primary osteoarthritis, unspecified knee: Secondary | ICD-10-CM

## 2012-12-02 DIAGNOSIS — Z96659 Presence of unspecified artificial knee joint: Secondary | ICD-10-CM

## 2012-12-02 DIAGNOSIS — D62 Acute posthemorrhagic anemia: Secondary | ICD-10-CM | POA: Diagnosis not present

## 2012-12-02 LAB — BASIC METABOLIC PANEL
BUN: 19 mg/dL (ref 6–23)
CO2: 20 mEq/L (ref 19–32)
Calcium: 8.9 mg/dL (ref 8.4–10.5)
Chloride: 105 mEq/L (ref 96–112)
Creatinine, Ser: 0.99 mg/dL (ref 0.50–1.35)
GFR calc Af Amer: >90 mL/min (ref >90)
GFR calc non Af Amer: 84 mL/min — ABNORMAL LOW (ref >90)
Glucose, Bld: 177 mg/dL — ABNORMAL HIGH (ref 70–99)
Potassium: 4.4 mEq/L (ref 3.5–5.1)
Sodium: 135 mEq/L (ref 135–145)

## 2012-12-02 LAB — CBC
HCT: 36 % — ABNORMAL LOW (ref 39.0–52.0)
Hemoglobin: 12.3 g/dL — ABNORMAL LOW (ref 13.0–17.0)
MCH: 32.1 pg (ref 26.0–34.0)
MCHC: 34.2 g/dL (ref 30.0–36.0)
MCV: 94 fL (ref 78.0–100.0)
Platelets: 218 10*3/uL (ref 150–400)
RBC: 3.83 MIL/uL — ABNORMAL LOW (ref 4.22–5.81)
RDW: 12.9 % (ref 11.5–15.5)
WBC: 14.4 10*3/uL — ABNORMAL HIGH (ref 4.0–10.5)

## 2012-12-02 LAB — PROTIME-INR
INR: 1.06 (ref 0.00–1.49)
Prothrombin Time: 13.6 seconds (ref 11.6–15.2)

## 2012-12-02 MED ORDER — WARFARIN VIDEO: Freq: Once | Status: DC

## 2012-12-02 MED ORDER — COUMADIN BOOK
Freq: Once | Status: AC
  Administered 2012-12-02: 20:00:00
  Filled 2012-12-02: qty 1

## 2012-12-02 MED ORDER — NON FORMULARY: 15.0000 mg | Freq: Every day | Status: DC

## 2012-12-02 MED ORDER — SODIUM CHLORIDE 0.9 % IV BOLUS (SEPSIS)
500.0000 mL | Freq: Once | INTRAVENOUS | Status: AC
  Administered 2012-12-02: 10:00:00 500 mL via INTRAVENOUS

## 2012-12-02 MED ORDER — WARFARIN - PHARMACIST DOSING INPATIENT: Freq: Every day | Status: DC

## 2012-12-02 MED ORDER — PANTOPRAZOLE SODIUM 20 MG PO TBEC
20.0000 mg | DELAYED_RELEASE_TABLET | Freq: Every day | ORAL | Status: DC
  Administered 2012-12-02: 11:00:00 20 mg via ORAL
  Filled 2012-12-02 (×2): qty 1

## 2012-12-02 MED ORDER — WARFARIN SODIUM 2.5 MG PO TABS
2.5000 mg | ORAL_TABLET | Freq: Once | ORAL | Status: AC
  Administered 2012-12-02: 20:00:00 2.5 mg via ORAL
  Filled 2012-12-02: qty 1

## 2012-12-02 NOTE — Consult Note (Signed)
Physical Medicine and Rehabilitation Consult Reason for Consult: Bilateral total knee arthroplasty Referring Physician: Dr. Despina Hick   HPI: Nathan Ramirez is a 67 y.o. right-handed male admitted 12/01/2012 with end-stage degenerative changes of both knees. Symptoms reported include pain, aching, stiffness with instability. No relief with conservative care. Underwent bilateral total knee arthroplasty 12/01/2012 per Dr. Despina Hick. Postoperative pain management. Weightbearing as tolerated bilateral lower extremities. Coumadin for DVT prophylaxis. Physical and occupational therapy evaluations pending. M.D. is requested physical medicine rehabilitation consult to consider inpatient rehabilitation services.  Was not able to tolerate occupational therapy today. Epidural catheter still in place however patient feels it is not working very well today.  Review of Systems  Gastrointestinal:       GERD  Musculoskeletal: Positive for joint pain and myalgias.  Neurological: Positive for headaches.  All other systems reviewed and are negative.   Past Medical History  Diagnosis Date  . Arthritis   . Chicken pox   . Allergy   . Elevated blood pressure reading   . GERD (gastroesophageal reflux disease)   . Hypercholesteremia     borderline  . Asthma     "allergic"  . Headache(784.0)     occasional tension  . PONV (postoperative nausea and vomiting)    Past Surgical History  Procedure Laterality Date  . Lumbar disc surgery  2008  . Knee arthroscopy  2009& 2010    RIGHT AND LEFT KNEE  . Appendectomy  1972   History reviewed. No pertinent family history. Social History:  reports that he quit smoking about 24 years ago. His smoking use included Cigarettes. He smoked 0.00 packs per day for 40 years. He has never used smokeless tobacco. He reports that he drinks alcohol. He reports that he does not use illicit drugs. Allergies: No Known Allergies Medications Prior to Admission  Medication Sig  Dispense Refill  . diazepam (VALIUM) 5 MG tablet Take 5 mg by mouth every 6 (six) hours as needed for anxiety.      . dicyclomine (BENTYL) 10 MG capsule Take 10 mg by mouth 4 (four) times daily -  with meals and at bedtime.      . dicyclomine (BENTYL) 10 MG capsule TAKE ONE CAPSULE BY MOUTH 4 TIMES DAILY BEFORE MEAL(S) AT BEDTIME  120 capsule  0  . Fluticasone-Salmeterol (ADVAIR) 100-50 MCG/DOSE AEPB Inhale 1 puff into the lungs 2 (two) times daily.      Marland Kitchen HYDROcodone-acetaminophen (NORCO/VICODIN) 5-325 MG per tablet Take 1 tablet by mouth every 6 (six) hours as needed for moderate pain.  120 tablet  0  . lansoprazole (PREVACID) 15 MG capsule Take 15 mg by mouth daily at 12 noon.      . traMADol (ULTRAM) 50 MG tablet Take 100 mg by mouth every 8 (eight) hours as needed for moderate pain.      Marland Kitchen diclofenac (VOLTAREN) 75 MG EC tablet Take 75 mg by mouth 2 (two) times daily.      . Multiple Vitamin (MULTIVITAMIN WITH MINERALS) TABS tablet Take 1 tablet by mouth daily.        Home: Home Living Family/patient expects to be discharged to:: Inpatient rehab Living Arrangements: Spouse/significant other  Functional History:   Functional Status:  Mobility:          ADL:    Cognition: Cognition Orientation Level: Oriented X4    Blood pressure 104/62, pulse 75, temperature 97.9 F (36.6 C), temperature source Oral, resp. rate 18, height 5\' 9"  (1.753 m), weight 97.07  kg (214 lb), SpO2 91.00%. Physical Exam  Constitutional: He is oriented to person, place, and time. He appears well-developed.  HENT:  Head: Normocephalic.  Eyes: EOM are normal.  Neck: Normal range of motion. Neck supple. No thyromegaly present.  Cardiovascular: Normal rate and regular rhythm.   Respiratory: Effort normal and breath sounds normal. No respiratory distress.  GI: Soft. Bowel sounds are normal. He exhibits no distension.  Neurological: He is alert and oriented to person, place, and time.  Follows full  commands  Skin:  Bilateral knee incisions are dressed and appropriately tender  Psychiatric: He has a normal mood and affect.   motor strength is 5/5 in bilateral deltoid, bicep, tricep and grip Ankle dorsiflexor plantar flexors 5/5 Hip flexors knee extension not tested secondary to knee immobilizers Sensory exam normal to light touch in bilateral upper and lower limbs Upper extremity range of motion is normal  Results for orders placed during the hospital encounter of 12/01/12 (from the past 24 hour(s))  CBC     Status: Abnormal   Collection Time    12/02/12  4:20 AM      Result Value Range   WBC 14.4 (*) 4.0 - 10.5 K/uL   RBC 3.83 (*) 4.22 - 5.81 MIL/uL   Hemoglobin 12.3 (*) 13.0 - 17.0 g/dL   HCT 10.9 (*) 60.4 - 54.0 %   MCV 94.0  78.0 - 100.0 fL   MCH 32.1  26.0 - 34.0 pg   MCHC 34.2  30.0 - 36.0 g/dL   RDW 98.1  19.1 - 47.8 %   Platelets 218  150 - 400 K/uL  BASIC METABOLIC PANEL     Status: Abnormal   Collection Time    12/02/12  4:20 AM      Result Value Range   Sodium 135  135 - 145 mEq/L   Potassium 4.4  3.5 - 5.1 mEq/L   Chloride 105  96 - 112 mEq/L   CO2 20  19 - 32 mEq/L   Glucose, Bld 177 (*) 70 - 99 mg/dL   BUN 19  6 - 23 mg/dL   Creatinine, Ser 2.95  0.50 - 1.35 mg/dL   Calcium 8.9  8.4 - 62.1 mg/dL   GFR calc non Af Amer 84 (*) >90 mL/min   GFR calc Af Amer >90  >90 mL/min  PROTIME-INR     Status: None   Collection Time    12/02/12  4:20 AM      Result Value Range   Prothrombin Time 13.6  11.6 - 15.2 seconds   INR 1.06  0.00 - 1.49   No results found.  Assessment/Plan: Diagnosis: End stage bilateral knee osteoarthritis postop day #1 status post bilateral total knee replacements 1. Does the need for close, 24 hr/day medical supervision in concert with the patient's rehab needs make it unreasonable for this patient to be served in a less intensive setting? Yes 2. Co-Morbidities requiring supervision/potential complications: Acute blood loss anemia,  obesity, history of asthma, urinary retention 3. Due to bladder management, bowel management, safety, skin/wound care, disease management, medication administration, pain management and patient education, does the patient require 24 hr/day rehab nursing? Yes 4. Does the patient require coordinated care of a physician, rehab nurse, PT (1-2 hrs/day, 5 days/week) and OT (1-2 hrs/day, 5 days/week) to address physical and functional deficits in the context of the above medical diagnosis(es)? Potentially Addressing deficits in the following areas: balance, endurance, locomotion, strength, transferring, bowel/bladder control, bathing, dressing, grooming  and toileting 5. Can the patient actively participate in an intensive therapy program of at least 3 hrs of therapy per day at least 5 days per week? Potentially 6. The potential for patient to make measurable gains while on inpatient rehab is good 7. Anticipated functional outcomes upon discharge from inpatient rehab are modified independent mobility with PT, modified independent ADLs with OT, not applicable with SLP. 8. Estimated rehab length of stay to reach the above functional goals is: 6-9 days 9. Does the patient have adequate social supports to accommodate these discharge functional goals? Yes 10. Anticipated D/C setting: Home 11. Anticipated post D/C treatments: HH therapy 12. Overall Rehab/Functional Prognosis: good  RECOMMENDATIONS: This patient's condition is appropriate for continued rehabilitative care in the following setting: CIR once able to tolerate PT and OT Patient has agreed to participate in recommended program. Yes Note that insurance prior authorization may be required for reimbursement for recommended care.  Comment: Rehabilitation RN to followup on patient progress on 11/21    12/02/2012

## 2012-12-02 NOTE — Evaluation (Signed)
Physical Therapy Evaluation Patient Details Name: Nathan Ramirez MRN: [id number] DOB: [birth year] Today's Date: 12/02/2012 Time: 1141-1200 PT Time Calculation (min): 19 min  PT Assessment / Plan / Recommendation History of Present Illness     Clinical Impression  Pt s/p Bil TKR presents with decreased Bil LE strength/ROM and post op pain limiting functional mobility.  Pt would benefit greatly from intense rehab at CIR level to maximize IND for return home.    PT Assessment       Follow Up Recommendations  CIR    Does the patient have the potential to tolerate intense rehabilitation      Barriers to Discharge        Equipment Recommendations  None recommended by PT    Recommendations for Other Services Rehab consult   Frequency      Precautions / Restrictions Precautions Precautions: Knee;Fall Required Braces or Orthoses: Knee Immobilizer - Right;Knee Immobilizer - Left Knee Immobilizer - Right: Discontinue once straight leg raise with < 10 degree lag Knee Immobilizer - Left: Discontinue once straight leg raise with < 10 degree lag Restrictions Weight Bearing Restrictions: No Other Position/Activity Restrictions: WBAT   Pertinent Vitals/Pain 8/10; premed, ice packs provided, RN aware      Mobility  Bed Mobility Bed Mobility: Not assessed (Pt reports increased pain with attempt to mobilize) Transfers Transfers: Not assessed Ambulation/Gait Ambulation/Gait Assistance: Not tested (comment)    Exercises Total Joint Exercises Ankle Circles/Pumps: AROM;Both;15 reps;Supine   PT Diagnosis:    PT Problem List:   PT Treatment Interventions:       PT Goals(Current goals can be found in the care plan section) Acute Rehab PT Goals Patient Stated Goal: Rehab at [facility] and then home to resume previous lifestyle with decreased pain PT Goal Formulation: With patient Time For Goal Achievement: 12/09/12 Potential to Achieve Goals: Good  Visit Information  Last PT  Received On: 12/02/12 Assistance Needed: +2       Prior Functioning  Home Living Family/patient expects to be discharged to:: Inpatient rehab Living Arrangements: Spouse/significant other Prior Function Level of Independence: Independent Communication Communication: No difficulties Dominant Hand: Right    Cognition  Cognition Arousal/Alertness: Awake/alert Behavior During Therapy: WFL for tasks assessed/performed Overall Cognitive Status: Within Functional Limits for tasks assessed    Extremity/Trunk Assessment Upper Extremity Assessment Upper Extremity Assessment: Overall WFL for tasks assessed Lower Extremity Assessment Lower Extremity Assessment: RLE deficits/detail;LLE deficits/detail Cervical / Trunk Assessment Cervical / Trunk Assessment: Normal   Balance    End of Session PT - End of Session Equipment Utilized During Treatment: Right knee immobilizer;Left knee immobilizer Activity Tolerance: Patient limited by pain Patient left: in bed;with call bell/phone within reach Nurse Communication: Mobility status;Patient requests pain meds  GP     Jawanza Zambito 12/02/2012, 4:08 PM

## 2012-12-02 NOTE — Progress Notes (Signed)
Subjective: 1 Day Post-Op Procedure(s) (LRB): TOTAL KNEE BILATERAL (Bilateral) Patient reports pain as mild.   Patient seen in rounds with Dr. Lequita Halt.  He is doing great on day one.  Already able to bilateral SLR's. Patient is well, and has had no acute complaints or problems We will start therapy today.  Plan is to go CIR after hospital stay.  Objective: Vital signs in last 24 hours: Temp:  [97 F (36.1 C)-98.3 F (36.8 C)] 97.9 F (36.6 C) (11/20 0640) Pulse Rate:  [68-100] 76 (11/20 0640) Resp:  [12-30] 17 (11/20 0725) BP: (91-149)/(46-101) 99/63 mmHg (11/20 0640) SpO2:  [91 %-99 %] 95 % (11/20 0648) Weight:  [97.07 kg (214 lb)] 97.07 kg (214 lb) (11/19 1842)  Intake/Output from previous day:  Intake/Output Summary (Last 24 hours) at 12/02/12 0859 Last data filed at 12/02/12 0646  Gross per 24 hour  Intake   2775 ml  Output   3815 ml  Net  -1040 ml    Intake/Output this shift: UOP 1725 since MN -1040 Negative on fluids and pressures have been soft.  Will give additional fluids this morning.  Epidural working well.  Labs:  Recent Labs  12/02/12 0420  HGB 12.3*    Recent Labs  12/02/12 0420  WBC 14.4*  RBC 3.83*  HCT 36.0*  PLT 218    Recent Labs  12/02/12 0420  NA 135  K 4.4  CL 105  CO2 20  BUN 19  CREATININE 0.99  GLUCOSE 177*  CALCIUM 8.9    Recent Labs  12/02/12 0420  INR 1.06    EXAM General - Patient is Alert, Appropriate and Oriented Extremity - Neurovascular intact Sensation intact distally Dorsiflexion/Plantar flexion intact Dressing - dressing C/D/I Motor Function - intact, moving feet and toes well on exam.  Both Hemovacs pulled without difficulty.  Past Medical History  Diagnosis Date  . Arthritis   . Chicken pox   . Allergy   . Elevated blood pressure reading   . GERD (gastroesophageal reflux disease)   . Hypercholesteremia     borderline  . Asthma     "allergic"  . Headache(784.0)     occasional tension    . PONV (postoperative nausea and vomiting)     Assessment/Plan: 1 Day Post-Op Procedure(s) (LRB): TOTAL KNEE BILATERAL (Bilateral) Principal Problem:   OA (osteoarthritis) of knee Active Problems:   Postoperative anemia due to acute blood loss  Estimated body mass index is 31.59 kg/(m^2) as calculated from the following:   Height as of this encounter: 5\' 9"  (1.753 m).   Weight as of this encounter: 97.07 kg (214 lb). Advance diet Up with therapy Discharge to CIR if approved. Continue foley for now.  Will keep foley until tomorrow and will not be removed until at least 6-8 hours following the removal of the epidural catheter.  DVT Prophylaxis - Lovenox and Coumadin, Lovenox will not start until tomorrow afternoon following removal of the epidural. First dose of Coumadin this evening. Weight-Bearing as tolerated to both leg  Continue O2 and Pulse OX   Take Coumadin for four weeks and then discontinue.  The dose may need to be adjusted based upon the INR.  Please follow the INR and titrate Coumadin dose for a therapeutic range between 2.0 and 3.0 INR.  After completing the four weeks of Coumadin, the patient may stop the Coumadin and resume their 81 mg Aspirin daily.  Lovenox injections will start tomorrow evening after the epidural has been removed  and continue until the INR is therapeutic at or greater than 2.0.  When INR reaches the therapeutic level of equal to or greater than 2.0, the patient may discontinue the Lovenox injections.  Nathan Ramirez 12/02/2012, 8:59 AM

## 2012-12-02 NOTE — Progress Notes (Signed)
POD #1 following bilateral total knee replacement with epidural infusion for post-op pain relief. Patient states he slept poorly last pm secondary to surgical pain. Describes his pain scale as 8/10 level this afternoon. Epidural insertion site appears intact and dry to touch. Cold sensory level with alcohol swab at T10 level. Encourage po pain medication as needed and increase epidural infusion from 10cc per hour to 14cc per hour for improved analgesia.

## 2012-12-02 NOTE — Progress Notes (Signed)
ANTICOAGULATION CONSULT NOTE - Initial Consult  Pharmacy Consult for warfarin Indication: bilateral TKA  No Known Allergies  Patient Measurements: Height: 5\' 9"  (175.3 cm) Weight: 214 lb (97.07 kg) IBW/kg (Calculated) : 70.7  Vital Signs: Temp: 97.9 F (36.6 C) (11/20 0640) Temp src: Oral (11/20 0640) BP: 99/63 mmHg (11/20 0640) Pulse Rate: 76 (11/20 0640)  Labs:  Recent Labs  12/02/12 0420  HGB 12.3*  HCT 36.0*  PLT 218  LABPROT 13.6  INR 1.06  CREATININE 0.99    Estimated Creatinine Clearance: 85.5 ml/min (by C-G formula based on Cr of 0.99).   Medical History: Past Medical History  Diagnosis Date  . Arthritis   . Chicken pox   . Allergy   . Elevated blood pressure reading   . GERD (gastroesophageal reflux disease)   . Hypercholesteremia     borderline  . Asthma     "allergic"  . Headache(784.0)     occasional tension  . PONV (postoperative nausea and vomiting)     Medications:  Scheduled:  . dexamethasone  10 mg Oral Daily   Or  . dexamethasone  10 mg Intravenous Daily  . dicyclomine  10 mg Oral TID AC & HS  . docusate sodium  100 mg Oral BID  . mometasone-formoterol  2 puff Inhalation BID  . pantoprazole  20 mg Oral Daily  . sodium chloride  500 mL Intravenous Once   Infusions:  . bupivacaine (MARCAINE, SENSORCAINE) epidural 10 mL/hr at 12/02/12 0726  . dextrose 5 % and 0.9% NaCl 100 mL/hr at 12/02/12 0255    Assessment: 67 yo s/p bilateral TKA currently with epidural in. Per orders, to start warfarin per pharmacy dosing tonight and to start Lovenox tomorrow evening after removal of epidural tomorrow AM.  Baseline INR 1.08  Stable CBC  No reported bleeding  Goal of Therapy:  INR 2-3   Plan:  1) Due to epidural still in place, per protocol to dose warfarin conservatively and given 2.5mg  tonight 2) Daily INR 3) Warfarin education booklet and video ordered 4) Noted plan to start Lovenox tomorrow evening after epidural  removed   Hessie Knows, PharmD, BCPS Pager [phone] 12/02/2012 10:43 AM

## 2012-12-02 NOTE — Progress Notes (Signed)
Physical Therapy Treatment Patient Details Name: Nathan Ramirez MRN: [id number] DOB: February 22, 1945 Today's Date: 12/02/2012 Time: 4098-1191 PT Time Calculation (min): 35 min  PT Assessment / Plan / Recommendation  History of Present Illness     PT Comments   Marked improvement in activity tolerance vs this am.  Follow Up Recommendations  CIR     Does the patient have the potential to tolerate intense rehabilitation     Barriers to Discharge        Equipment Recommendations  None recommended by PT    Recommendations for Other Services Rehab consult  Frequency 7X/week   Progress towards PT Goals Progress towards PT goals: Progressing toward goals  Plan Current plan remains appropriate    Precautions / Restrictions Precautions Precautions: Knee;Fall Required Braces or Orthoses: Knee Immobilizer - Right;Knee Immobilizer - Left Knee Immobilizer - Right: Discontinue once straight leg raise with < 10 degree lag Knee Immobilizer - Left: Discontinue once straight leg raise with < 10 degree lag Restrictions Weight Bearing Restrictions: No Other Position/Activity Restrictions: WBAT   Pertinent Vitals/Pain 7/10; premed, RN aware, ice packs provided.    Mobility  Bed Mobility Bed Mobility: Supine to Sit Supine to Sit: 1: +2 Total assist;With rails Supine to Sit: Patient Percentage: 50% Details for Bed Mobility Assistance: cues for sequence, assist to manage Bil LEs and to bring trunk to upright position Transfers Transfers: Sit to Stand;Stand to Sit Sit to Stand: 1: +2 Total assist;With upper extremity assist;From bed;From elevated surface Sit to Stand: Patient Percentage: 60% Stand to Sit: 1: +2 Total assist;With armrests;To chair/3-in-1;With upper extremity assist Stand to Sit: Patient Percentage: 60% Details for Transfer Assistance: cues and assist for LE management; cues for use of UEs to self assist; physical assist to bring wt fwd and up and to control  descent Ambulation/Gait Ambulation/Gait Assistance: 1: +2 Total assist Ambulation/Gait: Patient Percentage: 70% Ambulation Distance (Feet): 22 Feet Assistive device: Rolling walker Ambulation/Gait Assistance Details: cues for posture, sequence, stride length and position from RW Gait Pattern: Step-to pattern;Decreased step length - right;Decreased step length - left;Shuffle;Antalgic;Trunk flexed Gait velocity: decr    Exercises Total Joint Exercises Ankle Circles/Pumps: AROM;Both;15 reps;Supine Quad Sets: AROM;Both;10 reps;Supine Heel Slides: AAROM;Both;10 reps;Supine Straight Leg Raises: AAROM;Both;10 reps;Supine   PT Diagnosis:    PT Problem List:   PT Treatment Interventions:     PT Goals (current goals can now be found in the care plan section) Acute Rehab PT Goals Patient Stated Goal: Rehab at [facility] and then home to resume previous lifestyle with decreased pain PT Goal Formulation: With patient Time For Goal Achievement: 12/09/12 Potential to Achieve Goals: Good  Visit Information  Last PT Received On: 12/02/12 Assistance Needed: +2    Subjective Data  Subjective: I know I couldn't have done this this morning Patient Stated Goal: Rehab at [facility] and then home to resume previous lifestyle with decreased pain   Cognition  Cognition Arousal/Alertness: Awake/alert Behavior During Therapy: WFL for tasks assessed/performed Overall Cognitive Status: Within Functional Limits for tasks assessed    Balance     End of Session PT - End of Session Equipment Utilized During Treatment: Gait belt;Right knee immobilizer;Left knee immobilizer Activity Tolerance: Patient tolerated treatment well;Patient limited by pain Patient left: in chair;with call bell/phone within reach;with family/visitor present;with nursing/sitter in room Nurse Communication: Mobility status   GP     Nathan Ramirez 12/02/2012, 4:16 PM

## 2012-12-02 NOTE — Care Management Note (Signed)
  Page 1 of 1   12/03/2012     6:11:56 PM   CARE MANAGEMENT NOTE 12/03/2012  Patient:  Nathan Ramirez, Nathan Ramirez   Account Number:  [id number]  Date Initiated:  12/02/2012  Documentation initiated by:  Colleen Can  Subjective/Objective Assessment:   dx bilateral knee OA; bilateral knee arthroplaty     Action/Plan:   CM spoke with patient and spouse. Currently having considerable pain. 1st choice inpatient rehab at [facility], 2nd choice SNF rehab   Anticipated DC Date:  12/04/2012   Anticipated DC Plan:  IP REHAB FACILITY  In-house referral  Clinical Social Worker      DC Planning Services  CM consult      PAC Choice  IP REHAB   Choice offered to / List presented to:  C-1 Patient           Status of service:  In process, will continue to follow Medicare Important Message given?   (If response is "NO", the following Medicare IM given date fields will be blank) Date Medicare IM given:   Date Additional Medicare IM given:    Discharge Disposition:    Per UR Regulation:    If discussed at Long Length of Stay Meetings, dates discussed:    Comments:

## 2012-12-02 NOTE — Progress Notes (Signed)
CSW met with pt / spouse to assist with d/c planning. Pt would like to have rehab at [facility] following hospital d/c. If CIR is unable to assist pt will consider ST Rehab at SNF. CSW will continue to follow until d/c plans are finalized.  Cori Razor LCSW [phone]

## 2012-12-02 NOTE — Progress Notes (Signed)
Physical Therapy Treatment Patient Details Name: Nathan Ramirez MRN: [id number] DOB: 1945-07-12 Today's Date: 12/02/2012 Time: 4098-1191 PT Time Calculation (min): 17 min  PT Assessment / Plan / Recommendation  History of Present Illness     PT Comments     Follow Up Recommendations  CIR     Does the patient have the potential to tolerate intense rehabilitation     Barriers to Discharge        Equipment Recommendations  None recommended by PT    Recommendations for Other Services Rehab consult  Frequency 7X/week   Progress towards PT Goals Progress towards PT goals: Progressing toward goals  Plan Current plan remains appropriate    Precautions / Restrictions Precautions Precautions: Knee;Fall Required Braces or Orthoses: Knee Immobilizer - Right;Knee Immobilizer - Left Knee Immobilizer - Right: Discontinue once straight leg raise with < 10 degree lag Knee Immobilizer - Left: Discontinue once straight leg raise with < 10 degree lag Restrictions Weight Bearing Restrictions: No Other Position/Activity Restrictions: WBAT   Pertinent Vitals/Pain 7/10, RN aware, ice packs provided    Mobility  Bed Mobility Bed Mobility: Sit to Supine Supine to Sit: 1: +2 Total assist;With rails Supine to Sit: Patient Percentage: 50% Sit to Supine: 1: +2 Total assist Sit to Supine: Patient Percentage: 50% Details for Bed Mobility Assistance: cues for sequence, assist to manage Bil LEs and control trunk Transfers Transfers: Sit to Stand;Stand to Sit Sit to Stand: 1: +2 Total assist;With upper extremity assist;From elevated surface;From chair/3-in-1 Sit to Stand: Patient Percentage: 50% Stand to Sit: 1: +2 Total assist;To chair/3-in-1;With upper extremity assist;To bed Stand to Sit: Patient Percentage: 60% Details for Transfer Assistance: cues and assist for LE management; cues for use of UEs to self assist; physical assist to bring wt fwd and up and to control  descent Ambulation/Gait Ambulation/Gait Assistance: 1: +2 Total assist Ambulation/Gait: Patient Percentage: 70% Ambulation Distance (Feet): 6 Feet Assistive device: Rolling walker Ambulation/Gait Assistance Details: cues for posture, position from RW, sequence Gait Pattern: Step-to pattern;Decreased step length - right;Decreased step length - left;Shuffle;Antalgic;Trunk flexed Gait velocity: decr    Exercises Total Joint Exercises Ankle Circles/Pumps: AROM;Both;15 reps;Supine Quad Sets: AROM;Both;10 reps;Supine Heel Slides: AAROM;Both;10 reps;Supine Straight Leg Raises: AAROM;Both;10 reps;Supine   PT Diagnosis:    PT Problem List:   PT Treatment Interventions:     PT Goals (current goals can now be found in the care plan section) Acute Rehab PT Goals Patient Stated Goal: Rehab at [facility] and then home to resume previous lifestyle with decreased pain PT Goal Formulation: With patient Time For Goal Achievement: 12/09/12 Potential to Achieve Goals: Good  Visit Information  Last PT Received On: 12/02/12 Assistance Needed: +2    Subjective Data  Subjective: I know I couldn't have done this this morning Patient Stated Goal: Rehab at [facility] and then home to resume previous lifestyle with decreased pain   Cognition  Cognition Arousal/Alertness: Awake/alert Behavior During Therapy: WFL for tasks assessed/performed Overall Cognitive Status: Within Functional Limits for tasks assessed    Balance     End of Session PT - End of Session Equipment Utilized During Treatment: Gait belt;Right knee immobilizer;Left knee immobilizer Activity Tolerance: Patient tolerated treatment well;Patient limited by pain Patient left: in bed;with call bell/phone within reach;with family/visitor present Nurse Communication: Mobility status   GP     Nathan Ramirez 12/02/2012, 4:53 PM

## 2012-12-02 NOTE — Progress Notes (Signed)
OT Cancellation Note  Patient Details Name: Nathan Ramirez MRN: [id number] DOB: Oct 21, 1945   Cancelled Treatment:    Reason Eval/Treat Not Completed: Other (comment).  Pt has had bil TKAs.  Pt is not ready for OT today; will return tomorrow.    Seretha Estabrooks 12/02/2012, 12:22 PM Marica Otter, OTR/L [phone] 12/02/2012

## 2012-12-02 NOTE — Plan of Care (Signed)
Problem: Phase I Progression Outcomes Goal: Dangle or out of bed evening of surgery Outcome: Not Met (add Reason) Both legs and feet are numb from epidural med.

## 2012-12-02 NOTE — Progress Notes (Signed)
Utilization review completed.  

## 2012-12-02 NOTE — Progress Notes (Signed)
I will follow pt's progress to assist with planning rehab discharge dispo. Hopeful for inpt rehab Monday pending pt's progress and bed availability. I will discuss with pt tomorrow. [phone]

## 2012-12-03 DIAGNOSIS — E871 Hypo-osmolality and hyponatremia: Secondary | ICD-10-CM | POA: Diagnosis not present

## 2012-12-03 DIAGNOSIS — D62 Acute posthemorrhagic anemia: Secondary | ICD-10-CM | POA: Diagnosis not present

## 2012-12-03 DIAGNOSIS — E7800 Pure hypercholesterolemia, unspecified: Secondary | ICD-10-CM | POA: Diagnosis not present

## 2012-12-03 DIAGNOSIS — M1710 Unilateral primary osteoarthritis, unspecified knee: Secondary | ICD-10-CM | POA: Diagnosis not present

## 2012-12-03 DIAGNOSIS — R000 Tachycardia, unspecified: Secondary | ICD-10-CM | POA: Diagnosis not present

## 2012-12-03 LAB — BASIC METABOLIC PANEL
BUN: 16 mg/dL (ref 6–23)
CO2: 24 mEq/L (ref 19–32)
Calcium: 9 mg/dL (ref 8.4–10.5)
Chloride: 101 mEq/L (ref 96–112)
Creatinine, Ser: 1.13 mg/dL (ref 0.50–1.35)
GFR calc Af Amer: 77 mL/min — ABNORMAL LOW (ref >90)
GFR calc non Af Amer: 66 mL/min — ABNORMAL LOW (ref >90)
Glucose, Bld: 160 mg/dL — ABNORMAL HIGH (ref 70–99)
Potassium: 4.3 mEq/L (ref 3.5–5.1)
Sodium: 133 mEq/L — ABNORMAL LOW (ref 135–145)

## 2012-12-03 LAB — CBC
HCT: 31.9 % — ABNORMAL LOW (ref 39.0–52.0)
Hemoglobin: 11.5 g/dL — ABNORMAL LOW (ref 13.0–17.0)
MCH: 33.6 pg (ref 26.0–34.0)
MCHC: 36.1 g/dL — ABNORMAL HIGH (ref 30.0–36.0)
MCV: 93.3 fL (ref 78.0–100.0)
Platelets: 212 10*3/uL (ref 150–400)
RBC: 3.42 MIL/uL — ABNORMAL LOW (ref 4.22–5.81)
RDW: 13.2 % (ref 11.5–15.5)
WBC: 19.2 10*3/uL — ABNORMAL HIGH (ref 4.0–10.5)

## 2012-12-03 LAB — PROTIME-INR
INR: 1.09 (ref 0.00–1.49)
Prothrombin Time: 13.9 seconds (ref 11.6–15.2)

## 2012-12-03 MED ORDER — WARFARIN SODIUM 5 MG PO TABS
5.0000 mg | ORAL_TABLET | Freq: Once | ORAL | Status: AC
  Administered 2012-12-03: 18:00:00 5 mg via ORAL
  Filled 2012-12-03: qty 1

## 2012-12-03 MED ORDER — ENOXAPARIN SODIUM 30 MG/0.3ML ~~LOC~~ SOLN
30.0000 mg | Freq: Two times a day (BID) | SUBCUTANEOUS | Status: DC
  Administered 2012-12-03 – 2012-12-06 (×6): 30 mg via SUBCUTANEOUS
  Filled 2012-12-03 (×7): qty 0.3

## 2012-12-03 MED ORDER — LANSOPRAZOLE 15 MG PO CPDR
15.0000 mg | DELAYED_RELEASE_CAPSULE | Freq: Every day | ORAL | Status: DC
  Administered 2012-12-03 – 2012-12-06 (×4): 15 mg via ORAL
  Filled 2012-12-03 (×5): qty 1

## 2012-12-03 MED ORDER — NON FORMULARY: 15.0000 mg | Freq: Every day | Status: DC

## 2012-12-03 MED ORDER — SODIUM CHLORIDE 0.9 % IV BOLUS (SEPSIS)
500.0000 mL | Freq: Once | INTRAVENOUS | Status: AC
  Administered 2012-12-03: 500 mL via INTRAVENOUS

## 2012-12-03 NOTE — Progress Notes (Addendum)
OT Cancellation Note  Patient Details Name: Nathan Ramirez MRN: [id number] DOB: 1945/05/01   Cancelled Treatment:    Reason Eval/Treat Not Completed: Other (comment).  Pt is having a rough day/increased HR and is not ready for OT right now.  Will check later or tomorrow.    Skylan Lara 12/03/2012, 1:25 PM Marica Otter, OTR/L [phone] 12/03/2012

## 2012-12-03 NOTE — Progress Notes (Signed)
Physical Therapy Treatment Patient Details Name: Nathan Ramirez MRN: [id number] DOB: [birth year] Today's Date: 12/03/2012 Time: 4098-1191 PT Time Calculation (min): 29 min  PT Assessment / Plan / Recommendation  History of Present Illness     PT Comments   Pt continues motivated to participate but limited this pm by fatigue and elevated HR with minimal activity.  Pt tolerating sitting at EOB x 15 min with HR 133 - 141.    Follow Up Recommendations  CIR     Does the patient have the potential to tolerate intense rehabilitation     Barriers to Discharge        Equipment Recommendations  None recommended by PT    Recommendations for Other Services Rehab consult  Frequency 7X/week   Progress towards PT Goals Progress towards PT goals: Not progressing toward goals - comment (ltd by elevated HR)  Plan Current plan remains appropriate    Precautions / Restrictions Precautions Precautions: Knee;Fall Required Braces or Orthoses: Knee Immobilizer - Right;Knee Immobilizer - Left Knee Immobilizer - Right: Discontinue once straight leg raise with < 10 degree lag Knee Immobilizer - Left: Discontinue once straight leg raise with < 10 degree lag Restrictions Weight Bearing Restrictions: No Other Position/Activity Restrictions: WBAT   Pertinent Vitals/Pain 5/10; premed, ice packs provided    Mobility  Bed Mobility Bed Mobility: Supine to Sit;Sit to Supine Supine to Sit: 1: +2 Total assist;With rails Supine to Sit: Patient Percentage: 60% Sit to Supine: 1: +2 Total assist Sit to Supine: Patient Percentage: 40% Details for Bed Mobility Assistance: cues for sequence, assist to manage Bil LEs and control trunk Transfers Transfers: Not assessed (Deferred - pt with elevated HR sitting at EOB)    Exercises     PT Diagnosis:    PT Problem List:   PT Treatment Interventions:     PT Goals (current goals can now be found in the care plan section) Acute Rehab PT Goals Patient Stated  Goal: Rehab at [facility] and then home to resume previous lifestyle with decreased pain PT Goal Formulation: With patient Time For Goal Achievement: 12/09/12 Potential to Achieve Goals: Good  Visit Information  Last PT Received On: 12/03/12 Assistance Needed: +2    Subjective Data  Patient Stated Goal: Rehab at [facility] and then home to resume previous lifestyle with decreased pain   Cognition  Cognition Arousal/Alertness: Awake/alert Behavior During Therapy: WFL for tasks assessed/performed Overall Cognitive Status: Within Functional Limits for tasks assessed    Balance     End of Session PT - End of Session Equipment Utilized During Treatment: Right knee immobilizer;Left knee immobilizer Activity Tolerance: Other (comment);Patient limited by fatigue (elevated HR) Patient left: in bed;with call bell/phone within reach Nurse Communication: Mobility status;Other (comment)   GP     Nathan Ramirez 12/03/2012, 5:33 PM

## 2012-12-03 NOTE — Plan of Care (Signed)
Problem: Phase II Progression Outcomes Goal: Ambulates Outcome: Not Progressing Pt having increased heart rate, Physical Therapy only able to dangle and do excercises.

## 2012-12-03 NOTE — Progress Notes (Signed)
ANTICOAGULATION CONSULT NOTE - Follow Up  Pharmacy Consult for warfarin Indication: bilateral TKA  No Known Allergies  Patient Measurements: Height: 5\' 9"  (175.3 cm) Weight: 214 lb (97.07 kg) IBW/kg (Calculated) : 70.7  Vital Signs: Temp: 99.1 F (37.3 C) (11/21 0603) Temp src: Oral (11/21 0603) BP: 151/81 mmHg (11/21 0603) Pulse Rate: 99 (11/21 0603)  Labs:  Recent Labs  12/02/12 0420 12/03/12 0415  HGB 12.3* 11.5*  HCT 36.0* 31.9*  PLT 218 212  LABPROT 13.6 13.9  INR 1.06 1.09  CREATININE 0.99 1.13    Estimated Creatinine Clearance: 74.9 ml/min (by C-G formula based on Cr of 1.13).   Medical History: Past Medical History  Diagnosis Date  . Arthritis   . Chicken pox   . Allergy   . Elevated blood pressure reading   . GERD (gastroesophageal reflux disease)   . Hypercholesteremia     borderline  . Asthma     "allergic"  . Headache(784.0)     occasional tension  . PONV (postoperative nausea and vomiting)     Medications:  Scheduled:  . dicyclomine  10 mg Oral TID AC & HS  . docusate sodium  100 mg Oral BID  . lansoprazole  15 mg Oral Q1200  . mometasone-formoterol  2 puff Inhalation BID  . warfarin   Does not apply Once  . Warfarin - Pharmacist Dosing Inpatient   Does not apply q1800   Infusions:  . bupivacaine (MARCAINE, SENSORCAINE) epidural 10 mL/hr at 12/03/12 0749  . dextrose 5 % and 0.9% NaCl 50 mL/hr at 12/02/12 1750    Assessment: 67 yo s/p bilateral TKA currently with epidural in. Per orders, to start warfarin per pharmacy dosing tonight and to start Lovenox tomorrow evening after removal of epidural tomorrow AM.  INR 1.13 - 2.5mg  warfarin given 11/20  Stable CBC  No reported bleeding  Per RN report, epidural was pulled at 10am this morning - prophylactic Lovenox dosing to start 12 hours after epidural pulled per ortho notes  Goal of Therapy:  INR 2-3   Plan:  1) 5mg  warfarin tonight 2) Daily INR 3) Warfarin education booklet  and video ordered 4) Plan start Lovenox tonight then d/c once INR > 2  Hessie Knows, PharmD, BCPS Pager [phone] 12/03/2012 11:34 AM

## 2012-12-03 NOTE — Progress Notes (Signed)
Subjective: 2 Days Post-Op Procedure(s) (LRB): TOTAL KNEE BILATERAL (Bilateral) Patient reports pain as moderate.   Patient seen in rounds with Dr. Lequita Halt.  He has had more pain after getting up yesterday with therapy. Patient is having problems with pain in the knees, requiring pain medications Epidural to come out today.  Anticipate possible increase in pain temporarily after the epidural has been removed. He was also noted to have a fast heart rate on rounds and an EKG was taken which showed sinus tach.  He is actually negative on his fluids so will bolus with saline this morning.  Despite th negative balance, his pressures have been okay.  He will get his epidural out later.   Plan is to go Rehab after hospital stay.  Objective: Vital signs in last 24 hours: Temp:  [98.3 F (36.8 C)-99.5 F (37.5 C)] 99.1 F (37.3 C) (11/21 0603) Pulse Rate:  [71-99] 99 (11/21 0603) Resp:  [14-19] 16 (11/21 0749) BP: (106-151)/(62-81) 151/81 mmHg (11/21 0603) SpO2:  [93 %-98 %] 95 % (11/21 0837)  Intake/Output from previous day:  Intake/Output Summary (Last 24 hours) at 12/03/12 0918 Last data filed at 12/03/12 0700  Gross per 24 hour  Intake 2707.5 ml  Output   3175 ml  Net -467.5 ml    Intake/Output this shift: 725 UOP since MN -1267  Labs:  Recent Labs  12/02/12 0420 12/03/12 0415  HGB 12.3* 11.5*    Recent Labs  12/02/12 0420 12/03/12 0415  WBC 14.4* 19.2*  RBC 3.83* 3.42*  HCT 36.0* 31.9*  PLT 218 212    Recent Labs  12/02/12 0420 12/03/12 0415  NA 135 133*  K 4.4 4.3  CL 105 101  BUN 19 16  CREATININE 0.99 1.13  GLUCOSE 177* 160*  CALCIUM 8.9 9.0    Recent Labs  12/02/12 0420 12/03/12 0415  INR 1.06 1.09    EXAM General - Patient is Alert, Appropriate and Oriented Extremity - Neurovascular intact Sensation intact distally Dorsiflexion/Plantar flexion intact Dressing/Incision - clean, dry, no drainage, healing to both incisions Motor  Function - intact, moving feet and toes well on exam.    Past Medical History  Diagnosis Date  . Arthritis   . Chicken pox   . Allergy   . Elevated blood pressure reading   . GERD (gastroesophageal reflux disease)   . Hypercholesteremia     borderline  . Asthma     "allergic"  . Headache(784.0)     occasional tension  . PONV (postoperative nausea and vomiting)     Assessment/Plan: 2 Days Post-Op Procedure(s) (LRB): TOTAL KNEE BILATERAL (Bilateral) Principal Problem:   OA (osteoarthritis) of knee Active Problems:   Postoperative anemia due to acute blood loss   Hyponatremia   Sinus tachycardia by electrocardiogram  Estimated body mass index is 31.59 kg/(m^2) as calculated from the following:   Height as of this encounter: 5\' 9"  (1.753 m).   Weight as of this encounter: 97.07 kg (214 lb). Up with therapy Continue foley due to urinary output monitoring and she still has her epidural in place.  Will remove the catheter six hours after the epidural is removed.  Will need to note in chart when the epidural is pulled.  DVT Prophylaxis - Lovenox and Coumadin, Lovenox will not start until later this afternoon though after the epidural has been removed for twelve hours.  Will need to note in chart when the epidural is pulled. Anticipate possible increase in pain temporarily after the  epidural has been removed.  Weight-Bearing as tolerated to both legs  Take Coumadin for four weeks and then discontinue.  The dose may need to be adjusted based upon the INR.  Please follow the INR and titrate Coumadin dose for a therapeutic range between 2.0 and 3.0 INR.  After completing the four weeks of Coumadin, the patient may stop the Coumadin and resume their 81 mg Aspirin daily.  Lovenox injections will start later this evening after the epidural has been removed and continue until the INR is therapeutic at or greater than 2.0.  When INR reaches the therapeutic level of equal to or greater than  2.0, the patient may discontinue the Lovenox injections.  Fluids this morning and continue to monitor pulse.  Negative balance and increased pain.  Plan for [facility] - if gets approval, possibility that he may be able to go over on Saturday, this weekend.  Damariz Paganelli 12/03/2012, 9:18 AM

## 2012-12-03 NOTE — Progress Notes (Signed)
Physical Therapy Treatment Patient Details Name: Nathan Ramirez MRN: [id number] DOB: [birth year] Today's Date: 12/03/2012 Time: 4098-1191 PT Time Calculation (min): 31 min  PT Assessment / Plan / Recommendation  History of Present Illness     PT Comments   OOB deferred this am - pt HR elevated to 129 with therex.  Follow Up Recommendations  CIR     Does the patient have the potential to tolerate intense rehabilitation     Barriers to Discharge        Equipment Recommendations  None recommended by PT    Recommendations for Other Services Rehab consult  Frequency 7X/week   Progress towards PT Goals Progress towards PT goals: Progressing toward goals  Plan Current plan remains appropriate    Precautions / Restrictions Precautions Precautions: Knee;Fall Required Braces or Orthoses: Knee Immobilizer - Right;Knee Immobilizer - Left Knee Immobilizer - Right: Discontinue once straight leg raise with < 10 degree lag Knee Immobilizer - Left: Discontinue once straight leg raise with < 10 degree lag Restrictions Weight Bearing Restrictions: No Other Position/Activity Restrictions: WBAT   Pertinent Vitals/Pain 7/10; premed, ice packs provided    Mobility  Bed Mobility Bed Mobility: Not assessed (deferred 2* elevating HR) Transfers Transfers: Not assessed Ambulation/Gait Ambulation/Gait Assistance: Not tested (comment)    Exercises Total Joint Exercises Ankle Circles/Pumps: AROM;Both;15 reps;Supine Quad Sets: AROM;Both;Supine;20 reps Heel Slides: AAROM;Both;Supine;15 reps Straight Leg Raises: AAROM;Both;Supine;20 reps Goniometric ROM: AAROM L knee -10 - 50; R knee -10 - 60   PT Diagnosis:    PT Problem List:   PT Treatment Interventions:     PT Goals (current goals can now be found in the care plan section) Acute Rehab PT Goals Patient Stated Goal: Rehab at [facility] and then home to resume previous lifestyle with decreased pain PT Goal Formulation: With patient Time For  Goal Achievement: 12/09/12 Potential to Achieve Goals: Good  Visit Information  Last PT Received On: 12/03/12 Assistance Needed: +2    Subjective Data  Subjective: I think I am doing better than yesterday Patient Stated Goal: Rehab at [facility] and then home to resume previous lifestyle with decreased pain   Cognition  Cognition Arousal/Alertness: Awake/alert Behavior During Therapy: WFL for tasks assessed/performed Overall Cognitive Status: Within Functional Limits for tasks assessed    Balance     End of Session PT - End of Session Activity Tolerance: Patient tolerated treatment well Patient left: in bed;with call bell/phone within reach Nurse Communication: Mobility status;Other (comment) (HR to 129 with ther ex)   GP     Rivkah Wolz 12/03/2012, 12:29 PM

## 2012-12-03 NOTE — Progress Notes (Signed)
Patient not functionally ready for inpt rehab admission on Saturday. I discussed with pt's wife by phone and she is in agreement. I will see pt on Monday and plan admission. [phone]

## 2012-12-03 NOTE — Progress Notes (Signed)
Patient afebrile, vital signs stable.  Adequate analgesia. Site clean, dry, and intact. Patients questions answered.  Epidural removed, tip intact

## 2012-12-04 LAB — CBC
HCT: 29.6 % — ABNORMAL LOW (ref 39.0–52.0)
Hemoglobin: 10 g/dL — ABNORMAL LOW (ref 13.0–17.0)
MCH: 31.5 pg (ref 26.0–34.0)
MCHC: 33.8 g/dL (ref 30.0–36.0)
MCV: 93.4 fL (ref 78.0–100.0)
Platelets: 193 10*3/uL (ref 150–400)
RBC: 3.17 MIL/uL — ABNORMAL LOW (ref 4.22–5.81)
RDW: 13.1 % (ref 11.5–15.5)
WBC: 15.9 10*3/uL — ABNORMAL HIGH (ref 4.0–10.5)

## 2012-12-04 LAB — PROTIME-INR
INR: 1.27 (ref 0.00–1.49)
Prothrombin Time: 15.6 seconds — ABNORMAL HIGH (ref 11.6–15.2)

## 2012-12-04 MED ORDER — WARFARIN SODIUM 5 MG PO TABS
5.0000 mg | ORAL_TABLET | Freq: Once | ORAL | Status: AC
  Administered 2012-12-04: 5 mg via ORAL
  Filled 2012-12-04: qty 1

## 2012-12-04 NOTE — Progress Notes (Signed)
OT Cancellation Note  Patient Details Name: Nathan Ramirez MRN: [id number] DOB: 10/14/1945   Cancelled Treatment:    Reason Eval/Treat Not Completed: Nursing had just gotten pt back to bed at time of OT arrival.  Will perform eval on this patient in the morning.  THanks,   Alba Cory 12/04/2012, 2:20 PM

## 2012-12-04 NOTE — Progress Notes (Signed)
Subjective: 3 Days Post-Op Procedure(s) (LRB): TOTAL KNEE BILATERAL (Bilateral) Patient reports pain as 4 on 0-10 scale. Pulse Rate still up. Had EKG and note of Avel Peace reviewed.Will continue to observe.Hbg10.0   Objective: Vital signs in last 24 hours: Temp:  [98.6 F (37 C)-99.6 F (37.6 C)] 98.9 F (37.2 C) (11/22 0628) Pulse Rate:  [105-140] 116 (11/22 0628) Resp:  [16-21] 20 (11/22 0628) BP: (119-143)/(79-95) 119/79 mmHg (11/22 0628) SpO2:  [96 %-99 %] 99 % (11/22 0920)  Intake/Output from previous day: 11/21 0701 - 11/22 0700 In: 2727.5 [P.O.:300; I.V.:1419.2; IV Piggyback:958.3] Out: 2400 [Urine:2400] Intake/Output this shift: Total I/O In: -  Out: 550 [Urine:550]   Recent Labs  12/02/12 0420 12/03/12 0415 12/04/12 0415  HGB 12.3* 11.5* 10.0*    Recent Labs  12/03/12 0415 12/04/12 0415  WBC 19.2* 15.9*  RBC 3.42* 3.17*  HCT 31.9* 29.6*  PLT 212 193    Recent Labs  12/02/12 0420 12/03/12 0415  NA 135 133*  K 4.4 4.3  CL 105 101  CO2 20 24  BUN 19 16  CREATININE 0.99 1.13  GLUCOSE 177* 160*  CALCIUM 8.9 9.0    Recent Labs  12/03/12 0415 12/04/12 0415  INR 1.09 1.27    Neurovascular intact  Assessment/Plan: 3 Days Post-Op Procedure(s) (LRB): TOTAL KNEE BILATERAL (Bilateral) Up with therapy  Nesbit Michon A 12/04/2012, 10:07 AM

## 2012-12-04 NOTE — Progress Notes (Signed)
Physical Therapy Treatment Patient Details Name: MARQ REBELLO MRN: [id number] DOB: 03-08-1945 Today's Date: 12/04/2012 Time: 7829-5621 PT Time Calculation (min): 44 min  PT Assessment / Plan / Recommendation  History of Present Illness     PT Comments   Pt continues motivated but ltd by elevated HR with OOB activity.  RN aware  Follow Up Recommendations  CIR     Does the patient have the potential to tolerate intense rehabilitation     Barriers to Discharge        Equipment Recommendations  None recommended by PT    Recommendations for Other Services Rehab consult  Frequency 7X/week   Progress towards PT Goals Progress towards PT goals: Progressing toward goals  Plan Current plan remains appropriate    Precautions / Restrictions Precautions Precautions: Knee;Fall Required Braces or Orthoses: Knee Immobilizer - Right;Knee Immobilizer - Left Knee Immobilizer - Right: Discontinue once straight leg raise with < 10 degree lag Knee Immobilizer - Left: Discontinue once straight leg raise with < 10 degree lag Restrictions Weight Bearing Restrictions: No Other Position/Activity Restrictions: WBAT   Pertinent Vitals/Pain 7/10; premed, RN aware, ice packs provided    Mobility  Bed Mobility Bed Mobility: Supine to Sit Supine to Sit: 3: Mod assist Details for Bed Mobility Assistance: cues for sequence, assist to manage Bil LEs and control trunk Transfers Transfers: Sit to Stand;Stand to Sit Sit to Stand: 1: +2 Total assist;With upper extremity assist;From elevated surface;From bed Sit to Stand: Patient Percentage: 60% Stand to Sit: 1: +2 Total assist;To chair/3-in-1;With upper extremity assist;To bed Stand to Sit: Patient Percentage: 60% Details for Transfer Assistance: cues and assist for LE management; cues for use of UEs to self assist; physical assist to bring wt fwd and up and to control descent Ambulation/Gait Ambulation/Gait Assistance: 1: +2 Total  assist Ambulation/Gait: Patient Percentage: 70% Ambulation Distance (Feet): 5 Feet Assistive device: Rolling walker Ambulation/Gait Assistance Details: cues for sequence, posture and positioin from RW Gait Pattern: Step-to pattern;Decreased step length - right;Decreased step length - left;Shuffle;Antalgic;Trunk flexed Gait velocity: decr General Gait Details: ltd by elevated HR Stairs: No    Exercises Total Joint Exercises Ankle Circles/Pumps: AROM;Both;15 reps;Supine Quad Sets: AROM;Both;Supine;20 reps Heel Slides: AAROM;Both;Supine;20 reps Straight Leg Raises: AAROM;Both;Supine;10 reps Goniometric ROM: AAROM L Knee -10 - 50; R knee -10 - 60   PT Diagnosis:    PT Problem List:   PT Treatment Interventions:     PT Goals (current goals can now be found in the care plan section) Acute Rehab PT Goals Patient Stated Goal: Rehab at [facility] and then home to resume previous lifestyle with decreased pain PT Goal Formulation: With patient Time For Goal Achievement: 12/09/12 Potential to Achieve Goals: Good  Visit Information  Last PT Received On: 12/04/12 Assistance Needed: +2    Subjective Data  Subjective: I think I am doing better than yesterday - I got some sleep last night Patient Stated Goal: Rehab at [facility] and then home to resume previous lifestyle with decreased pain   Cognition  Cognition Arousal/Alertness: Awake/alert Behavior During Therapy: WFL for tasks assessed/performed Overall Cognitive Status: Within Functional Limits for tasks assessed    Balance     End of Session PT - End of Session Equipment Utilized During Treatment: Right knee immobilizer;Left knee immobilizer Activity Tolerance: Other (comment);Patient limited by fatigue (HR elevated) Patient left: in bed;with call bell/phone within reach Nurse Communication: Mobility status;Other (comment)   GP     Roel Douthat 12/04/2012, 1:45 PM

## 2012-12-04 NOTE — Progress Notes (Signed)
ANTICOAGULATION CONSULT NOTE - Follow Up Consult  Pharmacy Consult for Warfarin Indication: Bilateral TKA  No Known Allergies  Patient Measurements: Height: 5\' 9"  (175.3 cm) Weight: 214 lb (97.07 kg) IBW/kg (Calculated) : 70.7  Vital Signs: Temp: 98.9 F (37.2 C) (11/22 0628) Temp src: Oral (11/22 0628) BP: 119/79 mmHg (11/22 0628) Pulse Rate: 116 (11/22 0628)  Labs:  Recent Labs  12/02/12 0420 12/03/12 0415 12/04/12 0415  HGB 12.3* 11.5* 10.0*  HCT 36.0* 31.9* 29.6*  PLT 218 212 193  LABPROT 13.6 13.9 15.6*  INR 1.06 1.09 1.27  CREATININE 0.99 1.13  --     Estimated Creatinine Clearance: 74.9 ml/min (by C-G formula based on Cr of 1.13).   Medications:  Scheduled:  . dicyclomine  10 mg Oral TID AC & HS  . docusate sodium  100 mg Oral BID  . enoxaparin (LOVENOX) injection  30 mg Subcutaneous Q12H  . lansoprazole  15 mg Oral Q1200  . mometasone-formoterol  2 puff Inhalation BID  . warfarin   Does not apply Once  . Warfarin - Pharmacist Dosing Inpatient   Does not apply q1800   Infusions:  . dextrose 5 % and 0.9% NaCl 50 mL/hr at 12/04/12 0212   Inpatient warfarin doses 11/20-11/21: 2.5mg , 5mg   Assessment: 67 yo s/p bilateral TKA on 11/20, warfarin started for post-op VTE prophylaxis.  INR subtherapeutic as expected (1.27) after 2 doses of warfarin - initially doses conservatively while epidural was in place (pulled 11/21 at 10am)  Lovenox 30mg  sq q12h started 12hrs after epidural pulled - first dose given 11/21 at 10pm  CBC decreased as expected post-op, no visible bleeding reported  Goal of Therapy:  INR 2-3   Plan:   Repeat warfarin 5mg  today  Daily PT/INR  Continue lovenox 30mg  q12h per MD, d/c when INR > 2.0  Loralee Pacas, PharmD, BCPS Pager: [phone] 12/04/2012,9:06 AM

## 2012-12-04 NOTE — Plan of Care (Signed)
Problem: Consults Goal: Diagnosis- Total Joint Replacement Outcome: Completed/Met Date Met:  12/04/12 Primary Total Knee Bilateral  Problem: Phase I Progression Outcomes Goal: Dangle or out of bed evening of surgery Outcome: Not Met (add Reason) D/t epidural

## 2012-12-05 LAB — PROTIME-INR
INR: 1.77 — ABNORMAL HIGH (ref 0.00–1.49)
Prothrombin Time: 20.1 seconds — ABNORMAL HIGH (ref 11.6–15.2)

## 2012-12-05 MED ORDER — POLYETHYLENE GLYCOL 3350 17 G PO PACK: 17.0000 g | PACK | Freq: Every day | ORAL | Status: DC | PRN

## 2012-12-05 MED ORDER — WARFARIN SODIUM 2.5 MG PO TABS
2.5000 mg | ORAL_TABLET | Freq: Once | ORAL | Status: DC
  Administered 2012-12-05: 18:00:00 2.5 mg via ORAL
  Filled 2012-12-05: qty 1

## 2012-12-05 MED ORDER — FLEET ENEMA 7-19 GM/118ML RE ENEM: 1.0000 | ENEMA | Freq: Every day | RECTAL | Status: DC | PRN

## 2012-12-05 NOTE — Evaluation (Signed)
Occupational Therapy Evaluation Patient Details Name: Nathan Ramirez MRN: [id number] DOB: Aug 01, 1945 Today's Date: 12/05/2012 Time: 7253-6644 OT Time Calculation (min): 13 min  OT Assessment / Plan / Recommendation History of present illness B TKA   Clinical Impression   Pt presents to OT s/p BTKR. Pt will benefit from skilled OT to increase I with ADL activity due to problems listed below in order for pt return to PLOF    OT Assessment  Patient needs continued OT Services    Follow Up Recommendations  CIR       Equipment Recommendations  None recommended by OT       Frequency  Min 2X/week    Precautions / Restrictions Precautions Precautions: Knee;Fall Required Braces or Orthoses: Knee Immobilizer - Right;Knee Immobilizer - Left Knee Immobilizer - Right: Discontinue once straight leg raise with < 10 degree lag Knee Immobilizer - Left: Discontinue once straight leg raise with < 10 degree lag Restrictions Weight Bearing Restrictions: No Other Position/Activity Restrictions: WBAT       ADL  Grooming: Wash/dry face;Set up Upper Body Bathing: Set up Where Assessed - Upper Body Bathing: Unsupported sitting Lower Body Bathing: +2 Total assistance Lower Body Bathing: Patient Percentage: 60% Where Assessed - Lower Body Bathing: Supported sit to stand Upper Body Dressing: Set up Where Assessed - Upper Body Dressing: Unsupported sitting Lower Body Dressing: +2 Total assistance Lower Body Dressing: Patient Percentage: 60% Where Assessed - Lower Body Dressing: Supported sit to stand Toilet Transfer: +2 Total assistance Toilet Transfer: Patient Percentage: 40% Toilet Transfer Method: Sit to stand;Stand pivot Acupuncturist: Extra wide bedside commode Toileting - Clothing Manipulation and Hygiene: +2 Total assistance Toileting - Clothing Manipulation and Hygiene: Patient Percentage: 50% Where Assessed - Toileting Clothing Manipulation and Hygiene: Standing    OT  Diagnosis: Generalized weakness;Acute pain  OT Problem List: Decreased strength;Pain OT Treatment Interventions: Self-care/ADL training;Patient/family education;DME and/or AE instruction   OT Goals(Current goals can be found in the care plan section) Acute Rehab OT Goals Patient Stated Goal: Rehab at [facility] and then home to resume previous lifestyle with decreased pain  Visit Information  Last OT Received On: 12/05/12 Assistance Needed: +2 History of Present Illness: B TKA       Prior Functioning     Home Living Family/patient expects to be discharged to:: Inpatient rehab Living Arrangements: Spouse/significant other Prior Function Level of Independence: Independent Communication Communication: No difficulties         Vision/Perception Vision - History Patient Visual Report: No change from baseline   Cognition  Cognition Arousal/Alertness: Awake/alert Behavior During Therapy: WFL for tasks assessed/performed Overall Cognitive Status: Within Functional Limits for tasks assessed    Extremity/Trunk Assessment Upper Extremity Assessment Upper Extremity Assessment: Overall WFL for tasks assessed Cervical / Trunk Assessment Cervical / Trunk Assessment: Normal     Mobility Bed Mobility Bed Mobility: Supine to Sit;Sit to Supine Supine to Sit: 1: +2 Total assist;With rails Supine to Sit: Patient Percentage: 80% Details for Bed Mobility Assistance: cues for sequence, assist to manage Bil LEs  Transfers Sit to Stand: 1: +2 Total assist;With upper extremity assist;From elevated surface;From bed Sit to Stand: Patient Percentage: 60% Stand to Sit: 1: +2 Total assist;To chair/3-in-1;With upper extremity assist;To bed Stand to Sit: Patient Percentage: 60% Details for Transfer Assistance: cues and assist for LE management; cues for use of UEs to self assist; physical assist to bring wt fwd and up and to control descent; sit to stand x 2 from bed  and from 3 in 1     Exercise  Total Joint Exercises Ankle Circles/Pumps: AROM;Both;15 reps;Supine Quad Sets: AROM;Both;Supine;10 reps Short Arc QuadBarbaraann Boys;Both;10 reps Heel Slides: AAROM;Both;Supine;10 reps Straight Leg Raises: AAROM;Both;Supine;10 reps   Balance     End of Session OT - End of Session Activity Tolerance: Patient tolerated treatment well Patient left: in chair;with call bell/phone within reach Nurse Communication: Mobility status CPM Right Knee CPM Right Knee: Off  GO     Alba Cory 12/05/2012, 8:59 AM

## 2012-12-05 NOTE — Progress Notes (Signed)
Physical Therapy Treatment Patient Details Name: WILLIAN DONSON MRN: [id number] DOB: 04-Nov-1945 Today's Date: 12/05/2012 Time: 0272-5366 PT Time Calculation (min): 55 min  PT Assessment / Plan / Recommendation  History of Present Illness B TKA   PT Comments   **Progressing with mobility, walked 25' today. *  Follow Up Recommendations  CIR     Does the patient have the potential to tolerate intense rehabilitation     Barriers to Discharge        Equipment Recommendations  None recommended by PT    Recommendations for Other Services Rehab consult  Frequency 7X/week   Progress towards PT Goals Progress towards PT goals: Progressing toward goals  Plan Current plan remains appropriate    Precautions / Restrictions Precautions Precautions: Knee;Fall Required Braces or Orthoses: Knee Immobilizer - Right;Knee Immobilizer - Left Knee Immobilizer - Right: Discontinue once straight leg raise with < 10 degree lag Knee Immobilizer - Left: Discontinue once straight leg raise with < 10 degree lag Restrictions Weight Bearing Restrictions: No Other Position/Activity Restrictions: WBAT   Pertinent Vitals/Pain *7/10 B knees Premedicated, ice applied HR 130 with walking**    Mobility  Bed Mobility Bed Mobility: Supine to Sit;Sit to Supine Supine to Sit: 1: +2 Total assist;With rails Supine to Sit: Patient Percentage: 80% Details for Bed Mobility Assistance: cues for sequence, assist to manage Bil LEs  Transfers Transfers: Sit to Stand;Stand to Sit (Deferred - pt with elevated HR sitting at EOB) Sit to Stand: 1: +2 Total assist;With upper extremity assist;From elevated surface;From bed Sit to Stand: Patient Percentage: 60% Stand to Sit: 1: +2 Total assist;To chair/3-in-1;With upper extremity assist;To bed Stand to Sit: Patient Percentage: 60% Details for Transfer Assistance: cues and assist for LE management; cues for use of UEs to self assist; physical assist to bring wt fwd and up  and to control descent; sit to stand x 2 from bed and from 3 in 1 Ambulation/Gait Ambulation/Gait Assistance: 1: +2 Total assist Ambulation/Gait: Patient Percentage: 80% Ambulation Distance (Feet): 25 Feet Assistive device: Rolling walker Gait Pattern: Step-to pattern;Decreased step length - right;Decreased step length - left;Shuffle;Antalgic;Trunk flexed Gait velocity: decr General Gait Details: VCs to lift head, +2 for safety, 7/10 B knees with walking    Exercises Total Joint Exercises Ankle Circles/Pumps: AROM;Both;15 reps;Supine Quad Sets: AROM;Both;Supine;10 reps Short Arc QuadBarbaraann Boys;Both;10 reps Heel Slides: AAROM;Both;Supine;10 reps Straight Leg Raises: AAROM;Both;Supine;10 reps   PT Diagnosis:    PT Problem List:   PT Treatment Interventions:     PT Goals (current goals can now be found in the care plan section) Acute Rehab PT Goals Patient Stated Goal: Rehab at [facility] and then home to resume previous lifestyle with decreased pain PT Goal Formulation: With patient Time For Goal Achievement: 12/09/12 Potential to Achieve Goals: Good  Visit Information  Last PT Received On: 12/05/12 Assistance Needed: +2 History of Present Illness: B TKA    Subjective Data  Patient Stated Goal: Rehab at [facility] and then home to resume previous lifestyle with decreased pain   Cognition  Cognition Arousal/Alertness: Awake/alert Behavior During Therapy: WFL for tasks assessed/performed Overall Cognitive Status: Within Functional Limits for tasks assessed    Balance     End of Session PT - End of Session Equipment Utilized During Treatment: Right knee immobilizer;Left knee immobilizer Activity Tolerance: Patient limited by fatigue (HR elevated) Patient left: with call bell/phone within reach;in chair Nurse Communication: Mobility status;Other (comment) CPM Right Knee CPM Right Knee: Off   GP  Ralene Bathe Kistler 12/05/2012, 8:55 AM [phone]

## 2012-12-05 NOTE — Progress Notes (Signed)
ANTICOAGULATION CONSULT NOTE - Follow Up Consult  Pharmacy Consult for Warfarin Indication: Bilateral TKA  No Known Allergies  Patient Measurements: Height: 5\' 9"  (175.3 cm) Weight: 214 lb (97.07 kg) IBW/kg (Calculated) : 70.7  Vital Signs: Temp: 99.2 F (37.3 C) (11/23 0540) Temp src: Oral (11/23 0540) BP: 109/69 mmHg (11/23 0540) Pulse Rate: 112 (11/23 0540)  Labs:  Recent Labs  12/03/12 0415 12/04/12 0415 12/05/12 0448  HGB 11.5* 10.0*  --   HCT 31.9* 29.6*  --   PLT 212 193  --   LABPROT 13.9 15.6* 20.1*  INR 1.09 1.27 1.77*  CREATININE 1.13  --   --     Estimated Creatinine Clearance: 74.9 ml/min (by C-G formula based on Cr of 1.13).   Medications:  Scheduled:  . dicyclomine  10 mg Oral TID AC & HS  . docusate sodium  100 mg Oral BID  . enoxaparin (LOVENOX) injection  30 mg Subcutaneous Q12H  . lansoprazole  15 mg Oral Q1200  . mometasone-formoterol  2 puff Inhalation BID  . warfarin   Does not apply Once  . Warfarin - Pharmacist Dosing Inpatient   Does not apply q1800   Infusions:  . dextrose 5 % and 0.9% NaCl 50 mL/hr at 12/05/12 0303   Inpatient warfarin doses 11/20-11/22: 2.5mg , 5mg , 5mg   Assessment: 67 yo s/p bilateral TKA on 11/20, warfarin started for post-op VTE prophylaxis.  INR subtherapeutic as expected (1.77) after 3 doses of warfarin but now rising quickly - initially doses conservatively while epidural was in place (pulled 11/21 at 10am)  Lovenox 30mg  sq q12h started 12hrs after epidural pulled - first dose given 11/21 at 10pm  CBC decreased as expected post-op, no visible bleeding reported  Goal of Therapy:  INR 2-3   Plan:   Warfarin 2.5mg  today  Daily PT/INR  Continue lovenox 30mg  q12h per MD, d/c when INR > 2.0  Loralee Pacas, PharmD, BCPS Pager: [phone] 12/05/2012,7:53 AM

## 2012-12-05 NOTE — Progress Notes (Signed)
   Subjective: 4 Days Post-Op Procedure(s) (LRB): TOTAL KNEE BILATERAL (Bilateral) Patient reports pain as mild.   Patient seen in rounds with Dr. Lequita Halt. Patient is well, but has had some minor complaints of pain in the knee, requiring pain medications Epidural to come out today.  Anticipate possible increase in pain temporarily after the epidural has been removed. Plan is to go Rehab after hospital stay.  Objective: Vital signs in last 24 hours: Temp:  [98.4 F (36.9 C)-100.4 F (38 C)] 99.2 F (37.3 C) (11/23 0540) Pulse Rate:  [112-120] 112 (11/23 0540) Resp:  [18] 18 (11/23 0540) BP: (109-113)/(69-76) 109/69 mmHg (11/23 0540) SpO2:  [90 %-99 %] 94 % (11/23 0540)  Intake/Output from previous day:  Intake/Output Summary (Last 24 hours) at 12/05/12 0734 Last data filed at 12/05/12 0512  Gross per 24 hour  Intake   1950 ml  Output   3150 ml  Net  -1200 ml     Labs:  Recent Labs  12/03/12 0415 12/04/12 0415  HGB 11.5* 10.0*    Recent Labs  12/03/12 0415 12/04/12 0415  WBC 19.2* 15.9*  RBC 3.42* 3.17*  HCT 31.9* 29.6*  PLT 212 193    Recent Labs  12/03/12 0415  NA 133*  K 4.3  CL 101  BUN 16  CREATININE 1.13  GLUCOSE 160*  CALCIUM 9.0    Recent Labs  12/03/12 0415 12/04/12 0415 12/05/12 0448  INR 1.09 1.27 1.77*    EXAM General - Patient is Alert, Appropriate and Oriented Extremity - Neurovascular intact Dressing/Incision - clean, dry, no drainage, healing to both knees Motor Function - intact, moving feet and toes well on exam.    Past Medical History  Diagnosis Date  . Arthritis   . Chicken pox   . Allergy   . Elevated blood pressure reading   . GERD (gastroesophageal reflux disease)   . Hypercholesteremia     borderline  . Asthma     "allergic"  . Headache(784.0)     occasional tension  . PONV (postoperative nausea and vomiting)     Assessment/Plan: 4 Days Post-Op Procedure(s) (LRB): TOTAL KNEE BILATERAL  (Bilateral) Principal Problem:   OA (osteoarthritis) of knee Active Problems:   Postoperative anemia due to acute blood loss   Hyponatremia   Sinus tachycardia by electrocardiogram  Estimated body mass index is 31.59 kg/(m^2) as calculated from the following:   Height as of this encounter: 5\' 9"  (1.753 m).   Weight as of this encounter: 97.07 kg (214 lb). Up with therapy Continue foley due to urinary output monitoring and she still has her epidural in place.  Will remove the catheter six hours after the epidural is removed.  Will need to note in chart when the epidural is pulled.  DVT Prophylaxis - Lovenox and Coumadin  Weight-Bearing as tolerated to both legs  Take Coumadin for four weeks and then discontinue.  The dose may need to be adjusted based upon the INR.  Please follow the INR and titrate Coumadin dose for a therapeutic range between 2.0 and 3.0 INR.  After completing the four weeks of Coumadin, the patient may stop the Coumadin and resume their 81 mg Aspirin daily.  Plan for rehab tomorrow.  Patrica Duel 12/05/2012, 7:34 AM

## 2012-12-05 NOTE — Progress Notes (Signed)
Physical Therapy Treatment Patient Details Name: Nathan Ramirez MRN: [id number] DOB: Mar 25, 1945 Today's Date: 12/05/2012 Time: 4098-1191 PT Time Calculation (min): 23 min  PT Assessment / Plan / Recommendation  History of Present Illness B TKA   PT Comments   *Pt had just stood from recliner with nursing to walk to bathroom at start of PT session. ASsisted pt to walk to bathroom, then to bed. **  Follow Up Recommendations  CIR     Does the patient have the potential to tolerate intense rehabilitation     Barriers to Discharge        Equipment Recommendations  None recommended by PT    Recommendations for Other Services Rehab consult  Frequency 7X/week   Progress towards PT Goals Progress towards PT goals: Progressing toward goals  Plan Current plan remains appropriate    Precautions / Restrictions Precautions Precautions: Knee;Fall Required Braces or Orthoses: Knee Immobilizer - Right;Knee Immobilizer - Left Knee Immobilizer - Right: Discontinue once straight leg raise with < 10 degree lag Knee Immobilizer - Left: Discontinue once straight leg raise with < 10 degree lag Restrictions Weight Bearing Restrictions: No Other Position/Activity Restrictions: WBAT   Pertinent Vitals/Pain **7/10 B knees  Premedicated, ice applied*    Mobility  Bed Mobility Bed Mobility: Sit to Supine Supine to Sit: Patient Percentage: 80% Sit to Supine: 1: +2 Total assist Sit to Supine: Patient Percentage: 60% Details for Bed Mobility Assistance: cues for sequence, assist to manage Bil LEs  Transfers Sit to Stand: 1: +2 Total assist;With upper extremity assist;From elevated surface;From chair/3-in-1 Sit to Stand: Patient Percentage: 60% Stand to Sit: 1: +2 Total assist;To chair/3-in-1;With upper extremity assist;To bed Stand to Sit: Patient Percentage: 60% Details for Transfer Assistance: cues and assist for LE management; cues for use of UEs to self assist; physical assist to bring wt  fwd and up and to control descent; sit to stand x 2 from recliner and from 3 in 1 Ambulation/Gait Ambulation Distance (Feet): 24 Feet (12' x 2) Assistive device: Rolling walker Gait Pattern: Step-to pattern;Decreased step length - right;Decreased step length - left;Shuffle;Antalgic;Trunk flexed Gait velocity: decr General Gait Details: VCs to lift head, +2 for safety, 7/10 B knees with walking Stairs: No    Exercises     PT Diagnosis:    PT Problem List:   PT Treatment Interventions:     PT Goals (current goals can now be found in the care plan section) Acute Rehab PT Goals Patient Stated Goal: Rehab at [facility] and then home to resume previous lifestyle with decreased pain PT Goal Formulation: With patient Time For Goal Achievement: 12/09/12 Potential to Achieve Goals: Good  Visit Information  Last PT Received On: 12/05/12 Assistance Needed: +2 History of Present Illness: B TKA    Subjective Data  Patient Stated Goal: Rehab at [facility] and then home to resume previous lifestyle with decreased pain   Cognition  Cognition Arousal/Alertness: Awake/alert Behavior During Therapy: WFL for tasks assessed/performed Overall Cognitive Status: Within Functional Limits for tasks assessed    Balance     End of Session PT - End of Session Equipment Utilized During Treatment: Right knee immobilizer;Left knee immobilizer Activity Tolerance: Patient limited by fatigue (HR elevated) Patient left: with call bell/phone within reach;in bed;with family/visitor present Nurse Communication: Mobility status;Other (comment)   GP     Ralene Bathe Kistler 12/05/2012, 1:20 PM [phone]

## 2012-12-06 ENCOUNTER — Telehealth: Payer: Self-pay | Admitting: Internal Medicine

## 2012-12-06 ENCOUNTER — Inpatient Hospital Stay (HOSPITAL_COMMUNITY)
Admission: RE | Admit: 2012-12-06 | Discharge: 2012-12-13 | DRG: 945 | Disposition: A | Discharge status: Home Health | Payer: Medicare Other | Source: Intra-hospital | Attending: Physical Medicine & Rehabilitation | Admitting: Physical Medicine & Rehabilitation

## 2012-12-06 DIAGNOSIS — R945 Abnormal results of liver function studies: Secondary | ICD-10-CM

## 2012-12-06 DIAGNOSIS — M1710 Unilateral primary osteoarthritis, unspecified knee: Secondary | ICD-10-CM

## 2012-12-06 DIAGNOSIS — D62 Acute posthemorrhagic anemia: Secondary | ICD-10-CM

## 2012-12-06 DIAGNOSIS — Z79899 Other long term (current) drug therapy: Secondary | ICD-10-CM

## 2012-12-06 DIAGNOSIS — Z96659 Presence of unspecified artificial knee joint: Secondary | ICD-10-CM | POA: Diagnosis not present

## 2012-12-06 DIAGNOSIS — Z96653 Presence of artificial knee joint, bilateral: Secondary | ICD-10-CM

## 2012-12-06 DIAGNOSIS — T391X5A Adverse effect of 4-Aminophenol derivatives, initial encounter: Secondary | ICD-10-CM

## 2012-12-06 DIAGNOSIS — Z5189 Encounter for other specified aftercare: Secondary | ICD-10-CM | POA: Diagnosis not present

## 2012-12-06 DIAGNOSIS — K589 Irritable bowel syndrome without diarrhea: Secondary | ICD-10-CM | POA: Diagnosis not present

## 2012-12-06 DIAGNOSIS — J45909 Unspecified asthma, uncomplicated: Secondary | ICD-10-CM | POA: Diagnosis not present

## 2012-12-06 DIAGNOSIS — R339 Retention of urine, unspecified: Secondary | ICD-10-CM

## 2012-12-06 DIAGNOSIS — R7989 Other specified abnormal findings of blood chemistry: Secondary | ICD-10-CM | POA: Diagnosis not present

## 2012-12-06 DIAGNOSIS — K828 Other specified diseases of gallbladder: Secondary | ICD-10-CM | POA: Diagnosis not present

## 2012-12-06 DIAGNOSIS — K219 Gastro-esophageal reflux disease without esophagitis: Secondary | ICD-10-CM | POA: Diagnosis not present

## 2012-12-06 DIAGNOSIS — M7989 Other specified soft tissue disorders: Secondary | ICD-10-CM

## 2012-12-06 HISTORY — DX: Polyp of colon: K63.5

## 2012-12-06 LAB — PROTIME-INR
INR: 1.77 — ABNORMAL HIGH (ref 0.00–1.49)
Prothrombin Time: 20.1 seconds — ABNORMAL HIGH (ref 11.6–15.2)

## 2012-12-06 MED ORDER — WARFARIN SODIUM 4 MG PO TABS
4.0000 mg | ORAL_TABLET | Freq: Once | ORAL | Status: AC
  Administered 2012-12-06: 4 mg via ORAL
  Filled 2012-12-06: qty 1

## 2012-12-06 MED ORDER — POLYETHYLENE GLYCOL 3350 17 G PO PACK
17.0000 g | PACK | Freq: Every day | ORAL | Status: DC | PRN
  Administered 2012-12-08 – 2012-12-09 (×2): 17 g via ORAL
  Filled 2012-12-06 (×2): qty 1

## 2012-12-06 MED ORDER — WARFARIN SODIUM 4 MG PO TABS
4.0000 mg | ORAL_TABLET | Freq: Once | ORAL | Status: DC
  Filled 2012-12-06: qty 1

## 2012-12-06 MED ORDER — LANSOPRAZOLE 15 MG PO CPDR
15.0000 mg | DELAYED_RELEASE_CAPSULE | Freq: Every day | ORAL | Status: DC
  Administered 2012-12-07 – 2012-12-08 (×2): 15 mg via ORAL
  Filled 2012-12-06 (×3): qty 1

## 2012-12-06 MED ORDER — WARFARIN SODIUM 4 MG PO TABS: 4.0000 mg | ORAL_TABLET | Freq: Once | ORAL | Status: DC

## 2012-12-06 MED ORDER — ACETAMINOPHEN 325 MG PO TABS: 650.0000 mg | ORAL_TABLET | Freq: Four times a day (QID) | ORAL | Status: DC | PRN

## 2012-12-06 MED ORDER — OXYCODONE HCL 5 MG PO TABS: 5.0000 mg | ORAL_TABLET | ORAL | Status: DC | PRN

## 2012-12-06 MED ORDER — ONDANSETRON HCL 4 MG PO TABS: 4.0000 mg | ORAL_TABLET | Freq: Four times a day (QID) | ORAL | Status: DC | PRN

## 2012-12-06 MED ORDER — ACETAMINOPHEN 325 MG PO TABS: 325.0000 mg | ORAL_TABLET | ORAL | Status: DC | PRN

## 2012-12-06 MED ORDER — MOMETASONE FURO-FORMOTEROL FUM 100-5 MCG/ACT IN AERO
2.0000 | INHALATION_SPRAY | Freq: Two times a day (BID) | RESPIRATORY_TRACT | Status: DC
  Administered 2012-12-06 – 2012-12-13 (×13): 2 via RESPIRATORY_TRACT

## 2012-12-06 MED ORDER — DSS 100 MG PO CAPS: 100.0000 mg | ORAL_CAPSULE | Freq: Two times a day (BID) | ORAL | Status: DC

## 2012-12-06 MED ORDER — DICYCLOMINE HCL 10 MG PO CAPS
10.0000 mg | ORAL_CAPSULE | Freq: Three times a day (TID) | ORAL | Status: DC
  Administered 2012-12-06 – 2012-12-13 (×27): 10 mg via ORAL
  Filled 2012-12-06 (×31): qty 1

## 2012-12-06 MED ORDER — ENOXAPARIN SODIUM 30 MG/0.3ML ~~LOC~~ SOLN
30.0000 mg | Freq: Two times a day (BID) | SUBCUTANEOUS | Status: DC
  Administered 2012-12-06 – 2012-12-07 (×2): 30 mg via SUBCUTANEOUS
  Filled 2012-12-06 (×3): qty 0.3

## 2012-12-06 MED ORDER — DIAZEPAM 5 MG PO TABS: 5.0000 mg | ORAL_TABLET | Freq: Four times a day (QID) | ORAL | Status: DC | PRN

## 2012-12-06 MED ORDER — ALUM & MAG HYDROXIDE-SIMETH 200-200-20 MG/5ML PO SUSP
30.0000 mL | Freq: Four times a day (QID) | ORAL | Status: DC | PRN
  Administered 2012-12-06: 30 mL via ORAL
  Filled 2012-12-06: qty 30

## 2012-12-06 MED ORDER — POLYETHYLENE GLYCOL 3350 17 G PO PACK: 17.0000 g | PACK | Freq: Every day | ORAL | Status: DC | PRN

## 2012-12-06 MED ORDER — FLEET ENEMA 7-19 GM/118ML RE ENEM: 1.0000 | ENEMA | Freq: Every day | RECTAL | Status: DC | PRN

## 2012-12-06 MED ORDER — BISACODYL 10 MG RE SUPP: 10.0000 mg | Freq: Every day | RECTAL | Status: DC | PRN

## 2012-12-06 MED ORDER — DOCUSATE SODIUM 100 MG PO CAPS
100.0000 mg | ORAL_CAPSULE | Freq: Two times a day (BID) | ORAL | Status: DC
  Administered 2012-12-07 – 2012-12-13 (×11): 100 mg via ORAL
  Filled 2012-12-06 (×17): qty 1

## 2012-12-06 MED ORDER — ENOXAPARIN SODIUM 30 MG/0.3ML ~~LOC~~ SOLN: 30.0000 mg | Freq: Two times a day (BID) | SUBCUTANEOUS | Status: DC

## 2012-12-06 MED ORDER — METHOCARBAMOL 500 MG PO TABS
500.0000 mg | ORAL_TABLET | Freq: Four times a day (QID) | ORAL | Status: DC | PRN
  Administered 2012-12-06 – 2012-12-12 (×8): 500 mg via ORAL
  Filled 2012-12-06 (×8): qty 1

## 2012-12-06 MED ORDER — ONDANSETRON HCL 4 MG/2ML IJ SOLN: 4.0000 mg | Freq: Four times a day (QID) | INTRAMUSCULAR | Status: DC | PRN

## 2012-12-06 MED ORDER — OXYCODONE HCL 5 MG PO TABS
5.0000 mg | ORAL_TABLET | ORAL | Status: DC | PRN
  Administered 2012-12-06 – 2012-12-13 (×27): 20 mg via ORAL
  Filled 2012-12-06 (×27): qty 4

## 2012-12-06 MED ORDER — WARFARIN - PHARMACIST DOSING INPATIENT: Freq: Every day | Status: DC

## 2012-12-06 MED ORDER — TRAMADOL HCL 50 MG PO TABS: 100.0000 mg | ORAL_TABLET | Freq: Three times a day (TID) | ORAL | Status: DC | PRN

## 2012-12-06 MED ORDER — METHOCARBAMOL 500 MG PO TABS: 500.0000 mg | ORAL_TABLET | Freq: Four times a day (QID) | ORAL | Status: DC | PRN

## 2012-12-06 NOTE — Progress Notes (Signed)
   Subjective: 5 Days Post-Op Procedure(s) (LRB): TOTAL KNEE BILATERAL (Bilateral) Patient reports pain as mild.   Patient seen in rounds byr. [name]. Patient is well, but has had some minor complaints of pain in the knee, requiring pain medications Patient had to have I&O cath last night.  Will see if he can void on his own, if not may need to transfer with leg bag and then voiding trial later. Patient is ready to go [facility].  Objective: Vital signs in last 24 hours: Temp:  [99.1 F (37.3 C)-99.7 F (37.6 C)] 99.1 F (37.3 C) (11/24 0551) Pulse Rate:  [97-105] 97 (11/24 0551) Resp:  [18] 18 (11/24 0551) BP: (103-123)/(67-75) 112/68 mmHg (11/24 0551) SpO2:  [90 %-93 %] 90 % (11/24 0551)  Intake/Output from previous day:  Intake/Output Summary (Last 24 hours) at 12/06/12 0731 Last data filed at 12/06/12 0600  Gross per 24 hour  Intake   1360 ml  Output   2800 ml  Net  -1440 ml    Intake/Output this shift:    Labs:  Recent Labs  12/04/12 0415  HGB 10.0*    Recent Labs  12/04/12 0415  WBC 15.9*  RBC 3.17*  HCT 29.6*  PLT 193   No results found for this basename: NA, K, CL, CO2, BUN, CREATININE, GLUCOSE, CALCIUM,  in the last 72 hours  Recent Labs  12/05/12 0448 12/06/12 0403  INR 1.77* 1.77*    EXAM: General - Patient is Alert, Appropriate and Oriented Extremity - Neurovascular intact Sensation intact distally Dorsiflexion/Plantar flexion intact Incision - clean, dry, no drainage and healing to both incisions Motor Function - intact, moving feeand toes well on exam.   Assessment/Plan: 5 Days Post-Op Procedure(s) (LRB): TOTAL KNEE BILATERAL (Bilateral) Procedure(s) (LRB): TOTAL KNEE BILATERAL (Bilateral) Past Medical History  Diagnosis Date  . Arthritis   . Chicken pox   . Allergy   . Elevated blood pressure reading   . GERD (gastroesophageal reflux disease)   . Hypercholesteremia     borderline  . Asthma     "allergic"  .  Headache(784.0)     occasional tension  . PONV (postoperative nausea and vomiting)    Principal Problem:   OA (osteoarthritis) of knee Active Problems:   Postoperative anemia due to acute blood loss   Hyponatremia   Sinus tachycardia by electrocardiogram  Estimated body mass index is 31.59 kg/(m^2) as calculated from the following:   Height as of this encounter: 5\' 9"  (1.753 m).   Weight as of this encounter: 97.07 kg (214 lb). Up with therapy Discharge to CIR Diet - Cardiac diet Follow up - in 2weeks Activity - WBAT Disposition - Rehab Condition Upon Discharge - Good D/C Meds - See DC Summary DVT Prophylaxis - Lovenox and Coumadin Take Coumadin for four weeks and then discontinue. The dose may need to be adjusted based upon the INR. Please follow the INR and titrate Coumadin dose for a therapeutic range between 2.0 and 3.0 INR. After completing the four weeks of Coumadin, the patient may stop the Coumadin and resume their 81 mg Aspirin daily.   Nathan Ramirez 12/06/2012, 7:31 AM

## 2012-12-06 NOTE — Progress Notes (Signed)
Orthopedic Tech Progress Note Patient Details:  Nathan Ramirez 1945/04/27 [id number]  CPM Left Knee CPM Left Knee: On Left Knee Flexion (Degrees): 40 Left Knee Extension (Degrees): 0 Additional Comments: start time 2:40 pm CPM Right Knee CPM Right Knee: On   Jennye Moccasin 12/06/2012, 2:43 PM

## 2012-12-06 NOTE — Progress Notes (Signed)
Physical Therapy Treatment Patient Details Name: Nathan Ramirez MRN: [id number] DOB: [birth year] Today's Date: 12/06/2012 Time: 4098-1191 PT Time Calculation (min): 39 min  PT Assessment / Plan / Recommendation  History of Present Illness B TKA   PT Comments   *Significant progress with mobility today, he walked 200' with RW and min A. Performed B TKA exercises with assist. REady to DC to CIR from PT standpoint.**  Follow Up Recommendations  CIR     Does the patient have the potential to tolerate intense rehabilitation     Barriers to Discharge        Equipment Recommendations  None recommended by PT    Recommendations for Other Services Rehab consult  Frequency 7X/week   Progress towards PT Goals Progress towards PT goals: Progressing toward goals  Plan Current plan remains appropriate    Precautions / Restrictions Precautions Precautions: Knee;Fall Required Braces or Orthoses: Knee Immobilizer - Right;Knee Immobilizer - Left Knee Immobilizer - Right: Discontinue once straight leg raise with < 10 degree lag Knee Immobilizer - Left: Discontinue once straight leg raise with < 10 degree lag Restrictions Weight Bearing Restrictions: No Other Position/Activity Restrictions: WBAT   Pertinent Vitals/Pain *7/10 B knees Premedicated; ice applied**    Mobility  Bed Mobility Bed Mobility: Supine to Sit;Sit to Supine Supine to Sit: With rails;4: Min assist Supine to Sit: Patient Percentage: 80% Details for Bed Mobility Assistance:  assist to manage Bil LEs  Transfers Transfers: Sit to Stand;Stand to Sit Sit to Stand: 1: +2 Total assist;With upper extremity assist;From elevated surface;From bed Sit to Stand: Patient Percentage: 80% Stand to Sit: 1: +2 Total assist;To chair/3-in-1;With upper extremity assist Stand to Sit: Patient Percentage: 80% Details for Transfer Assistance: cues and assist for LE management; cues for use of UEs to self assist; physical assist to bring wt  fwd and up and to control descent;  Ambulation/Gait Ambulation/Gait Assistance: 4: Min assist Ambulation/Gait: Patient Percentage: 90% Ambulation Distance (Feet): 200 Feet Assistive device: Rolling walker Ambulation/Gait Assistance Details: cues for positioning in RW, tends to step too far to front of RW and had LOB posteriorly x 2 requiring min A Gait Pattern: Step-to pattern;Decreased step length - right;Decreased step length - left;Antalgic General Gait Details: jVCs to lift head, 7/10 pain B knees with walking    Exercises Total Joint Exercises Ankle Circles/Pumps: AROM;Both;15 reps;Supine Quad Sets: AROM;Both;Supine;10 reps Towel Squeeze: AROM;Both;10 reps Short Arc QuadBarbaraann Boys;Both;15 reps Heel Slides: AAROM;Both;Supine;10 reps Hip ABduction/ADduction: AAROM;Both;10 reps Straight Leg Raises: AAROM;Both;Supine;10 reps   PT Diagnosis:    PT Problem List:   PT Treatment Interventions:     PT Goals (current goals can now be found in the care plan section) Acute Rehab PT Goals Patient Stated Goal: Rehab at [facility] and then home to resume previous lifestyle with decreased pain; Wants to ride his horse again PT Goal Formulation: With patient Time For Goal Achievement: 12/09/12 Potential to Achieve Goals: Good  Visit Information  Last PT Received On: 12/06/12 Assistance Needed: +2 History of Present Illness: B TKA    Subjective Data  Patient Stated Goal: Rehab at [facility] and then home to resume previous lifestyle with decreased pain; Wants to ride his horse again   Cognition  Cognition Arousal/Alertness: Awake/alert Behavior During Therapy: WFL for tasks assessed/performed Overall Cognitive Status: Within Functional Limits for tasks assessed    Balance     End of Session PT - End of Session Equipment Utilized During Treatment: Right knee immobilizer;Left knee immobilizer Activity Tolerance:  Patient tolerated treatment well (HR elevated) Patient left: with call bell/phone  within reach;with family/visitor present;in chair Nurse Communication: Mobility status   GP     Ralene Bathe Kistler 12/06/2012, 10:42 AM [phone]

## 2012-12-06 NOTE — Progress Notes (Signed)
I met with patient at bedside. He is in agreement to admission to inpt rehab today. I will arrange. [phone]

## 2012-12-06 NOTE — PMR Pre-admission (Signed)
PMR Admission Coordinator Pre-Admission Assessment  Patient: Nathan Ramirez is an 67 y.o., male MRN: [id number] DOB: Aug 07, 1945 Height: 5\' 9"  (175.3 cm) Weight: 97.07 kg (214 lb)              Insurance Information HMO:     PPO:      PCP:      IPA:      80/20: yes     OTHER: no HMO PRIMARY: medicare a and b      Policy#: [id number]      Subscriber: pt Benefits:  Phone #: [email or web address]     Name: 12/03/12 Eff. Date: 08/13/12     Deduct: $ 1216      Out of Pocket Max: none      Life Max: none CIR: 100%      SNF: LBD 20 full days Outpatient: 80%     Co-Pay: 20% Home Health: 100%      Co-Pay: none DME: 80%     Co-Pay: 20% Providers: pt choice  SECONDARY: [employer]      Policy#: [id number]      Subscriber: pt  Medicaid Application Date:       Case Manager:  Disability Application Date:       Case Worker:   Emergency Contact Information Contact Information   Name Relation Home Work Mobile   [place] Spouse [phone]  [phone]     Current Medical History  Patient Admitting Diagnosis: B TKR History of Present Illness: Nathan Ramirez is a 67 y.o. right-handed male admitted 12/01/2012 with end-stage degenerative changes of both knees. Symptoms reported include pain, aching, stiffness with instability. No relief with conservative care. Underwent bilateral total knee arthroplasty 12/01/2012 per Dr. Despina Hick. Postoperative pain management. Weightbearing as tolerated bilateral lower extremities. Coumadin for DVT prophylaxis.   Past Medical History  Past Medical History  Diagnosis Date  . Arthritis   . Chicken pox   . Allergy   . Elevated blood pressure reading   . GERD (gastroesophageal reflux disease)   . Hypercholesteremia     borderline  . Asthma     "allergic"  . Headache(784.0)     occasional tension  . PONV (postoperative nausea and vomiting)     Family History  family history is not on file.  Prior Rehab/Hospitalizations: none  Current Medications  Current  facility-administered medications:acetaminophen (TYLENOL) suppository 650 mg, 650 mg, Rectal, Q6H PRN, Loanne Drilling, MD;  acetaminophen (TYLENOL) tablet 650 mg, 650 mg, Oral, Q6H PRN, Loanne Drilling, MD;  bisacodyl (DULCOLAX) suppository 10 mg, 10 mg, Rectal, Daily PRN, Loanne Drilling, MD;  dextrose 5 %-0.9 % sodium chloride infusion, , Intravenous, Continuous, Alexzandrew Perkins, PA-C, Last Rate: 50 mL/hr at 12/05/12 2124 diazepam (VALIUM) tablet 5 mg, 5 mg, Oral, Q6H PRN, Loanne Drilling, MD, 5 mg at 12/04/12 0329;  dicyclomine (BENTYL) capsule 10 mg, 10 mg, Oral, TID AC & HS, Loanne Drilling, MD, 10 mg at 12/06/12 0735;  diphenhydrAMINE (BENADRYL) 12.5 MG/5ML elixir 12.5-25 mg, 12.5-25 mg, Oral, Q4H PRN, Loanne Drilling, MD;  docusate sodium (COLACE) capsule 100 mg, 100 mg, Oral, BID, Loanne Drilling, MD, 100 mg at 12/05/12 2123 enoxaparin (LOVENOX) injection 30 mg, 30 mg, Subcutaneous, Q12H, Alexzandrew Perkins, PA-C, 30 mg at 12/05/12 2123;  lansoprazole (PREVACID) capsule 15 mg, 15 mg, Oral, Q1200, Loanne Drilling, MD, 15 mg at 12/05/12 1221;  menthol-cetylpyridinium (CEPACOL) lozenge 3 mg, 1 lozenge, Oral, PRN, Loanne Drilling, MD;  methocarbamol (ROBAXIN)  500 mg in dextrose 5 % 50 mL IVPB, 500 mg, Intravenous, Q6H PRN, Loanne Drilling, MD, 500 mg at 12/02/12 1154 methocarbamol (ROBAXIN) tablet 500 mg, 500 mg, Oral, Q6H PRN, Loanne Drilling, MD, 500 mg at 12/05/12 1034;  metoCLOPramide (REGLAN) injection 5-10 mg, 5-10 mg, Intravenous, Q8H PRN, Loanne Drilling, MD;  metoCLOPramide (REGLAN) tablet 5-10 mg, 5-10 mg, Oral, Q8H PRN, Loanne Drilling, MD;  mometasone-formoterol [facility]) 100-5 MCG/ACT inhaler 2 puff, 2 puff, Inhalation, BID, Loanne Drilling, MD, 2 puff at 12/05/12 1921 morphine 2 MG/ML injection 1-2 mg, 1-2 mg, Intravenous, Q1H PRN, Loanne Drilling, MD, 2 mg at 12/04/12 1950;  ondansetron (ZOFRAN) injection 4 mg, 4 mg, Intravenous, Q6H PRN, Loanne Drilling, MD;  ondansetron (ZOFRAN) tablet  4 mg, 4 mg, Oral, Q6H PRN, Loanne Drilling, MD;  oxyCODONE (Oxy IR/ROXICODONE) immediate release tablet 5-20 mg, 5-20 mg, Oral, Q3H PRN, Loanne Drilling, MD, 20 mg at 12/06/12 0735 phenol (CHLORASEPTIC) mouth spray 1 spray, 1 spray, Mouth/Throat, PRN, Loanne Drilling, MD;  polyethylene glycol (MIRALAX / GLYCOLAX) packet 17 g, 17 g, Oral, Daily PRN, Alexzandrew Perkins, PA-C;  sodium phosphate (FLEET) 7-19 GM/118ML enema 1 enema, 1 enema, Rectal, Daily PRN, Alexzandrew Perkins, PA-C;  traMADol (ULTRAM) tablet 50-100 mg, 50-100 mg, Oral, Q6H PRN, Loanne Drilling, MD warfarin (COUMADIN) video, , Does not apply, Once, Berkley Harvey, [facility];  Warfarin - Pharmacist Dosing Inpatient, , Does not apply, q1800, Berkley Harvey, [facility]  Patients Current Diet: General  Precautions / Restrictions Precautions Precautions: Knee;Fall Restrictions Weight Bearing Restrictions: No Other Position/Activity Restrictions: WBAT   Prior Activity Level Community (5-7x/wk): active; retired for 2 months; 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; raises 5 horses  Journalist, newspaper / Corporate investment banker Devices/Equipment: Eyeglasses  Prior Functional Level Prior Function Level of Independence: Independent  Current Functional Level Cognition  Overall Cognitive Status: Within Functional Limits for tasks assessed Orientation Level: Oriented X4    Extremity Assessment (includes Sensation/Coordination)          ADLs  Grooming: Wash/dry face;Set up Upper Body Bathing: Set up Where Assessed - Upper Body Bathing: Unsupported sitting Lower Body Bathing: +2 Total assistance Lower Body Bathing: Patient Percentage: 60% Where Assessed - Lower Body Bathing: Supported sit to stand Upper Body Dressing: Set up Where Assessed - Upper Body Dressing: Unsupported sitting Lower Body Dressing: +2 Total assistance Lower Body Dressing: Patient Percentage: 60% Where Assessed - Lower Body Dressing: Supported  sit to stand Toilet Transfer: +2 Total assistance Toilet Transfer: Patient Percentage: 40% Toilet Transfer Method: Sit to stand;Stand pivot Acupuncturist: Extra wide bedside commode Toileting - Clothing Manipulation and Hygiene: +2 Total assistance Toileting - Clothing Manipulation and Hygiene: Patient Percentage: 50% Where Assessed - Toileting Clothing Manipulation and Hygiene: Standing    Mobility  Bed Mobility: Sit to Supine Supine to Sit: 1: +2 Total assist;With rails Supine to Sit: Patient Percentage: 80% Sit to Supine: 1: +2 Total assist Sit to Supine: Patient Percentage: 60%    Transfers  Transfers: Sit to Stand;Stand to Sit (Deferred - pt with elevated HR sitting at EOB) Sit to Stand: 1: +2 Total assist;With upper extremity assist;From elevated surface;From chair/3-in-1 Sit to Stand: Patient Percentage: 60% Stand to Sit: 1: +2 Total assist;To chair/3-in-1;With upper extremity assist;To bed Stand to Sit: Patient Percentage: 60%    Ambulation / Gait / Stairs / Wheelchair Mobility  Ambulation/Gait Ambulation/Gait Assistance: 1: +2 Total assist Ambulation/Gait: Patient Percentage: 80%  Ambulation Distance (Feet): 24 Feet (12' x 2) Assistive device: Rolling walker Ambulation/Gait Assistance Details: cues for sequence, posture and positioin from RW Gait Pattern: Step-to pattern;Decreased step length - right;Decreased step length - left;Shuffle;Antalgic;Trunk flexed Gait velocity: decr General Gait Details: VCs to lift head, +2 for safety, 7/10 B knees with walking Stairs: No    Posture / Balance      Special needs/care consideration CPM yes Bowel mgmt: yes Bladder mgmt; I and O cath in last 24 hrs; has now voided   Previous Home Environment Living Arrangements: Spouse/significant other  Lives With: Spouse Available Help at Discharge: Available 24 hours/day;Family Type of Home: House Home Layout: One level Home Access: Stairs to enter [employer]  of Steps: 4 steps Bathroom Shower/Tub: Health visitor: Standard Bathroom Accessibility: Yes How Accessible: Accessible via walker Home Care Services: No  Discharge Living Setting Plans for Discharge Living Setting: Patient's home;Lives with (comment);Other (Comment) (wife ) Type of Home at Discharge: House Discharge Home Layout: One level Discharge Home Access: Stairs to enter Entrance Stairs-Number of Steps: 4 steps Discharge Bathroom Shower/Tub: Walk-in shower Discharge Bathroom Toilet: Standard Discharge Bathroom Accessibility: Yes How Accessible: Accessible via walker Does the patient have any problems obtaining your medications?: No  Social/Family/Support Systems Patient Roles: Spouse;Parent Contact Information: [place] Anticipated Caregiver: wife Anticipated Caregiver's Contact Information: home [phone]; cell [phone] Ability/Limitations of Caregiver: back issues due to numerous surgeries Caregiver Availability: 24/7 Discharge Plan Discussed with Primary Caregiver: Yes Is Caregiver In Agreement with Plan?: Yes Does Caregiver/Family have Issues with Lodging/Transportation while Pt is in Rehab?: No  Goals/Additional Needs Patient/Family Goal for Rehab: Mod I with PT and OT Expected length of stay: ELOS 6 -9 days   Decrease burden of Care through IP rehab admission: n/a  Possible need for SNF placement upon discharge:not anticipated   Patient Condition: This patient's medical and functional status has changed since the consult dated: 12/02/12 in which the Rehabilitation Physician determined and documented that the patient's condition is appropriate for intensive rehabilitative care in an inpatient rehabilitation facility. See "History of Present Illness" (above) for medical update. Functional changes are: overall mod assist. Patient's medical and functional status update has been discussed with the Rehabilitation physician and patient remains  appropriate for inpatient rehabilitation. Will admit to inpatient rehab today.  Preadmission Screen Completed By:  Clois Dupes, 12/06/2012 9:03 AM ______________________________________________________________________   Discussed status with Dr. Wynn Banker on 12/06/12 at  0903 and received telephone approval for admission today.  Admission Coordinator:  Clois Dupes, time 5784 Date 12/06/12.

## 2012-12-06 NOTE — Progress Notes (Signed)
ANTICOAGULATION CONSULT NOTE - Follow Up Consult  Pharmacy Consult for Warfarin Indication: Bilateral TKA  No Known Allergies  Patient Measurements: Height: 5\' 9"  (175.3 cm) Weight: 214 lb (97.07 kg) IBW/kg (Calculated) : 70.7  Vital Signs: Temp: 99.1 F (37.3 C) (11/24 0551) Temp src: Oral (11/24 0551) BP: 112/68 mmHg (11/24 0551) Pulse Rate: 97 (11/24 0551)  Labs:  Recent Labs  12/04/12 0415 12/05/12 0448 12/06/12 0403  HGB 10.0*  --   --   HCT 29.6*  --   --   PLT 193  --   --   LABPROT 15.6* 20.1* 20.1*  INR 1.27 1.77* 1.77*    Estimated Creatinine Clearance: 74.9 ml/min (by C-G formula based on Cr of 1.13).   Medications:  Scheduled:  . dicyclomine  10 mg Oral TID AC & HS  . docusate sodium  100 mg Oral BID  . enoxaparin (LOVENOX) injection  30 mg Subcutaneous Q12H  . lansoprazole  15 mg Oral Q1200  . mometasone-formoterol  2 puff Inhalation BID  . warfarin   Does not apply Once  . Warfarin - Pharmacist Dosing Inpatient   Does not apply q1800   Infusions:  . dextrose 5 % and 0.9% NaCl 50 mL/hr at 12/05/12 2124   Inpatient warfarin doses 11/20-11/22: 2.5mg , 5mg , 5mg , 2.5mg   Assessment: 67 yo s/p bilateral TKA on 11/20, warfarin started for post-op VTE prophylaxis.  INR subtherapeutic and unchanged (1.77) after 4 doses of warfarin - initially doses conservatively while epidural was in place (pulled 11/21 at 10am)  Lovenox 30mg  sq q12h started 12hrs after epidural pulled - first dose given 11/21 at 10pm  CBC decreased as expected post-op, no visible bleeding reported  Noted patient likely to transfer to [facility] today 11/24 at some point  Goal of Therapy:  INR 2-3   Plan:   Warfarin 4mg  today  Daily PT/INR  Continue lovenox 30mg  q12h per MD, d/c when INR > 2.0   Hessie Knows, PharmD, BCPS Pager [phone] 12/06/2012 10:22 AM

## 2012-12-06 NOTE — Telephone Encounter (Signed)
States dexilant was 90.00 and is listed as tier 4.  Asking if you can call the company and ask to reduce medication to tier 3.

## 2012-12-06 NOTE — H&P (Signed)
Physical Medicine and Rehabilitation Admission H&P  No chief complaint on file.  :  Chief complaint: Bilateral knee pain  HPI: Nathan Ramirez is a 67 y.o. right-handed male admitted 12/01/2012 with end-stage degenerative changes of both knees. Symptoms reported include pain, aching, stiffness with instability. No relief with conservative care. Underwent bilateral total knee arthroplasty 12/01/2012 per Dr. Despina Hick. Postoperative pain management with epidural catheter since removed. Weightbearing as tolerated bilateral lower extremities. Coumadin for DVT prophylaxis x4 weeks and then began aspirin 81 mg daily. Patient remained on subcutaneous Lovenox until INR greater than 2.00. Acute blood loss anemia 10.0 and monitored. Physical and occupational therapy evaluations completed an ongoing with recommendations of physical medicine rehabilitation consult to consider inpatient rehabilitation services. Patient was felt to be a good candidate for inpatient rehabilitation services was admitted for comprehensive rehabilitation program  ROS Review of Systems  Gastrointestinal:  GERD  Musculoskeletal: Positive for joint pain and myalgias.  Neurological: Positive for headaches.  All other systems reviewed and are negative  Past Medical History   Diagnosis  Date   .  Arthritis    .  Chicken pox    .  Allergy    .  Elevated blood pressure reading    .  GERD (gastroesophageal reflux disease)    .  Hypercholesteremia      borderline   .  Asthma      "allergic"   .  Headache(784.0)      occasional tension   .  PONV (postoperative nausea and vomiting)     Past Surgical History   Procedure  Laterality  Date   .  Lumbar disc surgery   2008   .  Knee arthroscopy   2009& 2010     RIGHT AND LEFT KNEE   .  Appendectomy   1972   .  Total knee arthroplasty  Bilateral  12/01/2012     Procedure: TOTAL KNEE BILATERAL; Surgeon: Loanne Drilling, MD; Location: [facility] ORS; Service: Orthopedics; Laterality: Bilateral;     History reviewed. No pertinent family history.  Social History: reports that he quit smoking about 24 years ago. His smoking use included Cigarettes. He smoked 0.00 packs per day for 40 years. He has never used smokeless tobacco. He reports that he drinks alcohol. He reports that he does not use illicit drugs.  Allergies: No Known Allergies  Medications Prior to Admission   Medication  Sig  Dispense  Refill   .  diazepam (VALIUM) 5 MG tablet  Take 5 mg by mouth every 6 (six) hours as needed for anxiety.     .  dicyclomine (BENTYL) 10 MG capsule  Take 10 mg by mouth 4 (four) times daily - with meals and at bedtime.     .  dicyclomine (BENTYL) 10 MG capsule  TAKE ONE CAPSULE BY MOUTH 4 TIMES DAILY BEFORE MEAL(S) AT BEDTIME  120 capsule  0   .  Fluticasone-Salmeterol (ADVAIR) 100-50 MCG/DOSE AEPB  Inhale 1 puff into the lungs 2 (two) times daily.     Marland Kitchen  HYDROcodone-acetaminophen (NORCO/VICODIN) 5-325 MG per tablet  Take 1 tablet by mouth every 6 (six) hours as needed for moderate pain.  120 tablet  0   .  lansoprazole (PREVACID) 15 MG capsule  Take 15 mg by mouth daily at 12 noon.     .  traMADol (ULTRAM) 50 MG tablet  Take 100 mg by mouth every 8 (eight) hours as needed for moderate pain.     Marland Kitchen  diclofenac (VOLTAREN) 75 MG EC tablet  Take 75 mg by mouth 2 (two) times daily.     .  Multiple Vitamin (MULTIVITAMIN WITH MINERALS) TABS tablet  Take 1 tablet by mouth daily.      Home:  Home Living  Family/patient expects to be discharged to:: Inpatient rehab  Living Arrangements: Spouse/significant other  Functional History:   Functional Status:  Mobility:  Bed Mobility  Bed Mobility: Sit to Supine  Supine to Sit: 1: +2 Total assist;With rails  Supine to Sit: Patient Percentage: 80%  Sit to Supine: 1: +2 Total assist  Sit to Supine: Patient Percentage: 60%  Transfers  Transfers: Sit to Stand;Stand to Sit (Deferred - pt with elevated HR sitting at EOB)  Sit to Stand: 1: +2 Total assist;With  upper extremity assist;From elevated surface;From chair/3-in-1  Sit to Stand: Patient Percentage: 60%  Stand to Sit: 1: +2 Total assist;To chair/3-in-1;With upper extremity assist;To bed  Stand to Sit: Patient Percentage: 60%  Ambulation/Gait  Ambulation/Gait Assistance: 1: +2 Total assist  Ambulation/Gait: Patient Percentage: 80%  Ambulation Distance (Feet): 24 Feet (12' x 2)  Assistive device: Rolling walker  Ambulation/Gait Assistance Details: cues for sequence, posture and positioin from RW  Gait Pattern: Step-to pattern;Decreased step length - right;Decreased step length - left;Shuffle;Antalgic;Trunk flexed  Gait velocity: decr  General Gait Details: VCs to lift head, +2 for safety, 7/10 B knees with walking  Stairs: No   ADL:  ADL  Grooming: Wash/dry face;Set up  Upper Body Bathing: Set up  Where Assessed - Upper Body Bathing: Unsupported sitting  Lower Body Bathing: +2 Total assistance  Where Assessed - Lower Body Bathing: Supported sit to stand  Upper Body Dressing: Set up  Where Assessed - Upper Body Dressing: Unsupported sitting  Lower Body Dressing: +2 Total assistance  Where Assessed - Lower Body Dressing: Supported sit to stand  Toilet Transfer: +2 Total assistance  Toilet Transfer Method: Sit to stand;Stand pivot  Acupuncturist: Extra wide bedside commode  Cognition:  Cognition  Overall Cognitive Status: Within Functional Limits for tasks assessed  Orientation Level: Oriented X4  Cognition  Arousal/Alertness: Awake/alert  Behavior During Therapy: WFL for tasks assessed/performed  Overall Cognitive Status: Within Functional Limits for tasks assessed  Physical Exam:  Blood pressure 112/68, pulse 97, temperature 99.1 F (37.3 C), temperature source Oral, resp. rate 18, height 5\' 9"  (1.753 m), weight 97.07 kg (214 lb), SpO2 90.00%.  Physical Exam  Constitutional: He is oriented to person, place, and time. He appears well-developed.  HENT:  Head:  Normocephalic.  Eyes: EOM are normal.  Neck: Normal range of motion. Neck supple. No thyromegaly present.  Cardiovascular: Normal rate and regular rhythm.  Respiratory: Effort normal and breath sounds normal. No respiratory distress.  GI: Soft. Bowel sounds are normal. He exhibits no distension.  Neurological: He is alert and oriented to person, place, and time.  Follows full commands  Skin:  Bilateral knee incisions are dressed and appropriately tender  Psychiatric: He has a normal mood and affect.  motor strength is 5/5 in bilateral deltoid, bicep, tricep and grip  Ankle dorsiflexor plantar flexors 5/5  Right lower extremity is in the CPM machine Left lower extremity has 2 minus hip flexion 4 minus ankle dorsiflexion and plantarflexion Sensory exam normal to light touch in bilateral upper and lower limbs  Upper extremity range of motion is normal  Results for orders placed during the hospital encounter of 12/01/12 (from the past 48 hour(s))   PROTIME-INR  Status: Abnormal    Collection Time    12/05/12 4:48 AM   Result  Value  Range    Prothrombin Time  20.1 (*)  11.6 - 15.2 seconds    INR  1.77 (*)  0.00 - 1.49   PROTIME-INR Status: Abnormal    Collection Time    12/06/12 4:03 AM   Result  Value  Range    Prothrombin Time  20.1 (*)  11.6 - 15.2 seconds    INR  1.77 (*)  0.00 - 1.49    No results found.  Post Admission Physician Evaluation:  1. Functional deficits secondary to bilateral end-stage arthritis of the knees status post bilateral TK R. postoperative day #5. 2. Patient is admitted to receive collaborative, interdisciplinary care between the physiatrist, rehab nursing staff, and therapy team. 3. Patient's level of medical complexity and substantial therapy needs in context of that medical necessity cannot be provided at a lesser intensity of care such as a SNF. 4. Patient has experienced substantial functional loss from his/her baseline which was documented above under  the "Functional History" and "Functional Status" headings. Judging by the patient's diagnosis, physical exam, and functional history, the patient has potential for functional progress which will result in measurable gains while on inpatient rehab. These gains will be of substantial and practical use upon discharge in facilitating mobility and self-care at the household level. 5. Physiatrist will provide 24 hour management of medical needs as well as oversight of the therapy plan/treatment and provide guidance as appropriate regarding the interaction of the two. 6. 24 hour rehab nursing will assist with bowel management, safety, skin/wound care, disease management, medication administration, pain management and patient education and help integrate therapy concepts, techniques,education, etc. 7. PT will assess and treat for/with: pre gait, gait training, endurance , safety, equipment, neuromuscular re education 8. . Goals are: modified independent. 9. OT will assess and treat for/with: ADLs,  Neuromuscular re education, safety, endurance, equipment. Goals are: Mod I. 10. SLP will assess and treat for/with: NA. Goals are: NA. 11. Case Management and Social Worker will assess and treat for psychological issues and discharge planning. 12. Team conference will be held weekly to assess progress toward goals and to determine barriers to discharge. 13. Patient will receive at least 3 hours of therapy per day at least 5 days per week. 14. ELOS: 5-7days  15. Prognosis: excellent Medical Problem List and Plan:  1. 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 DVT Prophylaxis/Anticoagulation: Coumadin for DVT prophylaxis. Continue Lovenox for INR greater than 2.00. Check vascular study  3. Pain Management: Oxycodone and Robaxin as needed. Monitor with increased mobility  4. Neuropsych: This patient is capable of making decisions on his own behalf.  5. Acute  blood loss anemia. Followup CBC  6. GERD. Prevacid  7. Asthma.Dulera 2 puffs twice a day. No complaints of shortness of breath  8. Irritable bowel syndrome. Bentyl 10 mg 3 times a day.   Erick Colace M.D. [facility] FAAPM&R (Sports Med, Neuromuscular Med) Diplomate Am Board of Electrodiagnostic Med Diplomate Am Board of Pain Medicine Fellow Am Board of Interventional Pain Physicians  12/06/2012

## 2012-12-06 NOTE — Progress Notes (Signed)
Clinical Social Work  Per chart review, patient to be admitted to CIR today. CSW is signing off but available if further needs arise. CSW made SNF aware of patient's DC plans.  Unk Lightning, LCSW (Coverage for [employer])

## 2012-12-06 NOTE — Progress Notes (Signed)
VASCULAR LAB PRELIMINARY  PRELIMINARY  PRELIMINARY  PRELIMINARY  Bilateral lower extremity venous duplex  completed.    Preliminary report:  Bilateral:  No evidence of DVT, superficial thrombosis, or Baker's Cyst.   Anamae Rochelle, RVT 12/06/2012, 5:09 PM

## 2012-12-06 NOTE — Discharge Summary (Signed)
Physician Discharge Summary   Patient ID: Nathan Ramirez MRN: [id number] DOB/AGE: [birth year] 67 y.o.  Admit date: 12/01/2012 Discharge date: 12/06/2012  Primary Diagnosis:  Osteoarthritis Bilateral knee(s)  Admission Diagnoses:  Past Medical History  Diagnosis Date  . Arthritis   . Chicken pox   . Allergy   . Elevated blood pressure reading   . GERD (gastroesophageal reflux disease)   . Hypercholesteremia     borderline  . Asthma     "allergic"  . Headache(784.0)     occasional tension  . PONV (postoperative nausea and vomiting)    Discharge Diagnoses:   Principal Problem:   OA (osteoarthritis) of knee Active Problems:   Postoperative anemia due to acute blood loss   Hyponatremia   Sinus tachycardia by electrocardiogram   Urinary retention  Estimated body mass index is 31.59 kg/(m^2) as calculated from the following:   Height as of this encounter: 5\' 9"  (1.753 m).   Weight as of this encounter: 97.07 kg (214 lb).  Procedure:  Procedure(s) (LRB): TOTAL KNEE BILATERAL (Bilateral)   Consults: [facility]  HPI: Nathan Ramirez is a 67 y.o. year old male with end stage OA of both knees with progressively worsening pain and dysfunction. He has constant pain, with activity and at rest and significant functional deficits with difficulties even with ADLs. He has had extensive non-op management including analgesics, injections of cortisone and viscosupplements, and home exercise program, but remains in significant pain with significant dysfunction. Radiographs show bone on bone arthritis medial and patellofemoral compartments of both knees. We discussed doing both knees in the same setting versus one at a time including the procedure, risks, potential complications and rehab course associated with each and he elected for bilateral TKA. He presents now for bilateral Total Knee Arthroplasty.   Laboratory Data: Admission on 12/01/2012  Component Date Value Range Status    . WBC 12/02/2012 14.4* 4.0 - 10.5 K/uL Final  . RBC 12/02/2012 3.83* 4.22 - 5.81 MIL/uL Final  . Hemoglobin 12/02/2012 12.3* 13.0 - 17.0 g/dL Final  . HCT 40/98/1191 36.0* 39.0 - 52.0 % Final  . MCV 12/02/2012 94.0  78.0 - 100.0 fL Final  . MCH 12/02/2012 32.1  26.0 - 34.0 pg Final  . MCHC 12/02/2012 34.2  30.0 - 36.0 g/dL Final  . RDW 47/82/9562 12.9  11.5 - 15.5 % Final  . Platelets 12/02/2012 218  150 - 400 K/uL Final  . Sodium 12/02/2012 135  135 - 145 mEq/L Final  . Potassium 12/02/2012 4.4  3.5 - 5.1 mEq/L Final  . Chloride 12/02/2012 105  96 - 112 mEq/L Final  . CO2 12/02/2012 20  19 - 32 mEq/L Final  . Glucose, Bld 12/02/2012 177* 70 - 99 mg/dL Final  . BUN 13/08/6576 19  6 - 23 mg/dL Final  . Creatinine, Ser 12/02/2012 0.99  0.50 - 1.35 mg/dL Final  . Calcium 46/96/2952 8.9  8.4 - 10.5 mg/dL Final  . GFR calc non Af Amer 12/02/2012 84* >90 mL/min Final  . GFR calc Af Amer 12/02/2012 >90  >90 mL/min Final   Comment: (NOTE)                          The eGFR has been calculated using the CKD EPI equation.                          This calculation has not  been validated in all clinical situations.                          eGFR's persistently <90 mL/min signify possible Chronic Kidney                          Disease.  Marland Kitchen Prothrombin Time 12/02/2012 13.6  11.6 - 15.2 seconds Final  . INR 12/02/2012 1.06  0.00 - 1.49 Final  . WBC 12/03/2012 19.2* 4.0 - 10.5 K/uL Final  . RBC 12/03/2012 3.42* 4.22 - 5.81 MIL/uL Final  . Hemoglobin 12/03/2012 11.5* 13.0 - 17.0 g/dL Final  . HCT 40/98/1191 31.9* 39.0 - 52.0 % Final  . MCV 12/03/2012 93.3  78.0 - 100.0 fL Final  . MCH 12/03/2012 33.6  26.0 - 34.0 pg Final  . MCHC 12/03/2012 36.1* 30.0 - 36.0 g/dL Final  . RDW 47/82/9562 13.2  11.5 - 15.5 % Final  . Platelets 12/03/2012 212  150 - 400 K/uL Final  . Sodium 12/03/2012 133* 135 - 145 mEq/L Final  . Potassium 12/03/2012 4.3  3.5 - 5.1 mEq/L Final  . Chloride 12/03/2012 101  96 - 112  mEq/L Final  . CO2 12/03/2012 24  19 - 32 mEq/L Final  . Glucose, Bld 12/03/2012 160* 70 - 99 mg/dL Final  . BUN 13/08/6576 16  6 - 23 mg/dL Final  . Creatinine, Ser 12/03/2012 1.13  0.50 - 1.35 mg/dL Final  . Calcium 46/96/2952 9.0  8.4 - 10.5 mg/dL Final  . GFR calc non Af Amer 12/03/2012 66* >90 mL/min Final  . GFR calc Af Amer 12/03/2012 77* >90 mL/min Final   Comment: (NOTE)                          The eGFR has been calculated using the CKD EPI equation.                          This calculation has not been validated in all clinical situations.                          eGFR's persistently <90 mL/min signify possible Chronic Kidney                          Disease.  Marland Kitchen Prothrombin Time 12/03/2012 13.9  11.6 - 15.2 seconds Final  . INR 12/03/2012 1.09  0.00 - 1.49 Final  . WBC 12/04/2012 15.9* 4.0 - 10.5 K/uL Final  . RBC 12/04/2012 3.17* 4.22 - 5.81 MIL/uL Final  . Hemoglobin 12/04/2012 10.0* 13.0 - 17.0 g/dL Final  . HCT 84/13/2440 29.6* 39.0 - 52.0 % Final  . MCV 12/04/2012 93.4  78.0 - 100.0 fL Final  . MCH 12/04/2012 31.5  26.0 - 34.0 pg Final  . MCHC 12/04/2012 33.8  30.0 - 36.0 g/dL Final  . RDW 11/09/2534 13.1  11.5 - 15.5 % Final  . Platelets 12/04/2012 193  150 - 400 K/uL Final  . Prothrombin Time 12/04/2012 15.6* 11.6 - 15.2 seconds Final  . INR 12/04/2012 1.27  0.00 - 1.49 Final  . Prothrombin Time 12/05/2012 20.1* 11.6 - 15.2 seconds Final  . INR 12/05/2012 1.77* 0.00 - 1.49 Final  . Prothrombin Time 12/06/2012 20.1* 11.6 - 15.2 seconds Final  . INR 12/06/2012 1.77*  0.00 - 1.49 Final  Hospital Outpatient Visit on 11/24/2012  Component Date Value Range Status  . aPTT 11/24/2012 40* 24 - 37 seconds Final   Comment:                                 IF BASELINE aPTT IS ELEVATED,                          SUGGEST PATIENT RISK ASSESSMENT                          BE USED TO DETERMINE APPROPRIATE                          ANTICOAGULANT THERAPY.  . WBC 11/24/2012 7.3  4.0 -  10.5 K/uL Final  . RBC 11/24/2012 4.70  4.22 - 5.81 MIL/uL Final  . Hemoglobin 11/24/2012 15.1  13.0 - 17.0 g/dL Final  . HCT 16/10/9602 44.5  39.0 - 52.0 % Final  . MCV 11/24/2012 94.7  78.0 - 100.0 fL Final  . MCH 11/24/2012 32.1  26.0 - 34.0 pg Final  . MCHC 11/24/2012 33.9  30.0 - 36.0 g/dL Final  . RDW 54/09/8117 13.0  11.5 - 15.5 % Final  . Platelets 11/24/2012 264  150 - 400 K/uL Final  . Sodium 11/24/2012 137  135 - 145 mEq/L Final  . Potassium 11/24/2012 4.2  3.5 - 5.1 mEq/L Final  . Chloride 11/24/2012 103  96 - 112 mEq/L Final  . CO2 11/24/2012 25  19 - 32 mEq/L Final  . Glucose, Bld 11/24/2012 98  70 - 99 mg/dL Final  . BUN 14/78/2956 15  6 - 23 mg/dL Final  . Creatinine, Ser 11/24/2012 1.01  0.50 - 1.35 mg/dL Final  . Calcium 21/30/8657 9.8  8.4 - 10.5 mg/dL Final  . Total Protein 11/24/2012 7.5  6.0 - 8.3 g/dL Final  . Albumin 84/69/6295 4.2  3.5 - 5.2 g/dL Final  . AST 28/41/3244 32  0 - 37 U/L Final  . ALT 11/24/2012 34  0 - 53 U/L Final  . Alkaline Phosphatase 11/24/2012 66  39 - 117 U/L Final  . Total Bilirubin 11/24/2012 0.4  0.3 - 1.2 mg/dL Final  . GFR calc non Af Amer 11/24/2012 76* >90 mL/min Final  . GFR calc Af Amer 11/24/2012 88* >90 mL/min Final   Comment: (NOTE)                          The eGFR has been calculated using the CKD EPI equation.                          This calculation has not been validated in all clinical situations.                          eGFR's persistently <90 mL/min signify possible Chronic Kidney                          Disease.  Marland Kitchen Prothrombin Time 11/24/2012 12.7  11.6 - 15.2 seconds Final  . INR 11/24/2012 0.97  0.00 - 1.49 Final  . ABO/RH(D) 11/24/2012 O POS   Final  .  Antibody Screen 11/24/2012 NEG   Final  . Sample Expiration 11/24/2012 12/04/2012   Final  . Color, Urine 11/24/2012 YELLOW  YELLOW Final  . APPearance 11/24/2012 CLEAR  CLEAR Final  . Specific Gravity, Urine 11/24/2012 1.011  1.005 - 1.030 Final  . pH  11/24/2012 6.5  5.0 - 8.0 Final  . Glucose, UA 11/24/2012 NEGATIVE  NEGATIVE mg/dL Final  . Hgb urine dipstick 11/24/2012 NEGATIVE  NEGATIVE Final  . Bilirubin Urine 11/24/2012 NEGATIVE  NEGATIVE Final  . Ketones, ur 11/24/2012 NEGATIVE  NEGATIVE mg/dL Final  . Protein, ur 21/30/8657 NEGATIVE  NEGATIVE mg/dL Final  . Urobilinogen, UA 11/24/2012 0.2  0.0 - 1.0 mg/dL Final  . Nitrite 84/69/6295 NEGATIVE  NEGATIVE Final  . Leukocytes, UA 11/24/2012 NEGATIVE  NEGATIVE Final   MICROSCOPIC NOT DONE ON URINES WITH NEGATIVE PROTEIN, BLOOD, LEUKOCYTES, NITRITE, OR GLUCOSE <1000 mg/dL.  Marland Kitchen MRSA, PCR 11/24/2012 NEGATIVE  NEGATIVE Final  . Staphylococcus aureus 11/24/2012 POSITIVE* NEGATIVE Final   Comment:                                 The Xpert SA Assay (FDA                          approved for NASAL specimens                          in patients over 64 years of age),                          is one component of                          a comprehensive surveillance                          program.  Test performance has                          been validated by [employer] for patients greater                          than or equal to 91 year old.                          It is not intended                          to diagnose infection nor to                          guide or monitor treatment.  . ABO/RH(D) 11/24/2012 O POS   Final  Office Visit on 11/17/2012  Component Date Value Range Status  . PSA 11/17/2012 0.56  0.10 - 4.00 ng/ml Final  . Preliminary Report 11/17/2012 Culture in Progress for 4 Weeks   Preliminary     X-Rays:Dg Chest 2 View  11/24/2012   CLINICAL DATA:  Preop bilateral knee replacements  EXAM:  CHEST  2 VIEW  COMPARISON:  None.  FINDINGS: Linear atelectasis/ scarring in the lung bases bilaterally. Negative for heart failure. Negative for pneumonia effusion or mass.  IMPRESSION: No active cardiopulmonary disease.   Electronically Signed   By:  Marlan Palau M.D.   On: 11/24/2012 13:16    EKG: Orders placed during the hospital encounter of 12/01/12  . EKG 12-LEAD  . EKG 12-LEAD     Hospital Course: Patient was admitted to [facility] and taken to the OR and underwent the above stated procedure well without complications.  Patient tolerated the procedure well and was later transferred to the recovery room and then to the orthopaedic floor for postoperative care. Anesthesia was consulted postoperatively to place an epidural in for postoperative pain management. The patient was also given PO and IV analgesics for pain control following their surgery.  They were given 24 hours of postoperative antibiotics and started on DVT prophylaxis in the form of Lovenox and Coumadin after the epidural had been removed.  CIR was consulted to look into rehab bed. PT and OT were ordered for total joint protocol.  Discharge planning consulted to help with postop disposition and equipment needs.  Patient had a good night on the evening of surgery and started to get up OOB with therapy on day one.   Hemovac drains were pulled without difficulty on day one.  Continued to work with therapy into day two. He was also noted to have a fast heart rate on rounds and an EKG was taken which showed sinus tach. He was actually negative on his fluids so will bolus with saline that morning. Dressings were changed on day two and both incisions were healing well.  The epidural was removed without difficulty by Anesthesia on day two.  By day three, the patient started to show progress with therapy.  They continued to receive therapy each day for continued total knee protocol.  The incisions were healing well.  They continued to progress on day four and day five at which time the patient was seen in rounds and was ready to go to [facility].  He was noted to have urinary retention of day four requiring I&O cath but final was able to void on the morning of day five.        Discharge Medications: Prior to Admission medications   Medication Sig Start Date End Date Taking? Authorizing Provider  dicyclomine (BENTYL) 10 MG capsule Take 10 mg by mouth 4 (four) times daily -  with meals and at bedtime.   Yes Historical Provider, MD  dicyclomine (BENTYL) 10 MG capsule TAKE ONE CAPSULE BY MOUTH 4 TIMES DAILY BEFORE MEAL(S) AT BEDTIME 11/29/12  Yes Sherlene Shams, MD  Fluticasone-Salmeterol (ADVAIR) 100-50 MCG/DOSE AEPB Inhale 1 puff into the lungs 2 (two) times daily.   Yes Historical Provider, MD  lansoprazole (PREVACID) 15 MG capsule Take 15 mg by mouth daily at 12 noon.   Yes Historical Provider, MD  acetaminophen (TYLENOL) 325 MG tablet Take 2 tablets (650 mg total) by mouth every 6 (six) hours as needed for mild pain (or Fever >/= 101). 12/06/12   Briar Witherspoon, PA-C  bisacodyl (DULCOLAX) 10 MG suppository Place 1 suppository (10 mg total) rectally daily as needed for moderate constipation. 12/06/12   Marcoantonio Legault, PA-C  diazepam (VALIUM) 5 MG tablet Take 1 tablet (5 mg total) by mouth every 6 (six) hours as needed for anxiety. 12/06/12   Evian Derringer Julien Girt, PA-C  docusate sodium 100 MG CAPS Take 100 mg by mouth 2 (two) times daily. 12/06/12   Eliot Bencivenga, PA-C  enoxaparin (LOVENOX) 30 MG/0.3ML injection Inject 0.3 mLs (30 mg total) into the skin every 12 (twelve) hours. Continue Lovenox injections until the INR is therapeutic at or greater than 2.0.  When INR reaches the therapeutic level of equal to or greater than 2.0, the patient may discontinue the Lovenox injections. 12/06/12   Tifini Reeder, PA-C  methocarbamol (ROBAXIN) 500 MG tablet Take 1 tablet (500 mg total) by mouth every 6 (six) hours as needed for muscle spasms. 12/06/12   Sharna Gabrys, PA-C  ondansetron (ZOFRAN) 4 MG tablet Take 1 tablet (4 mg total) by mouth every 6 (six) hours as needed for nausea. 12/06/12   Amoy Steeves, PA-C  oxyCODONE (OXY IR/ROXICODONE)  5 MG immediate release tablet Take 1-4 tablets (5-20 mg total) by mouth every 3 (three) hours as needed for moderate pain or severe pain. 12/06/12   Nell Schrack, PA-C  polyethylene glycol (MIRALAX / GLYCOLAX) packet Take 17 g by mouth daily as needed for moderate constipation. 12/06/12   Christalyn Goertz, PA-C  sodium phosphate (FLEET) 7-19 GM/118ML ENEM Place 1 enema rectally daily as needed for severe constipation. 12/06/12   Monique Gift, PA-C  traMADol (ULTRAM) 50 MG tablet Take 2 tablets (100 mg total) by mouth every 8 (eight) hours as needed (mild pain). 12/06/12   Audray Rumore Julien Girt, PA-C  warfarin (COUMADIN) 4 MG tablet Take 1 tablet (4 mg total) by mouth one time only at 6 PM. Take Coumadin for four weeks and then discontinue.  The dose may need to be adjusted based upon the INR.  Please follow the INR and titrate Coumadin dose for a therapeutic range between 2.0 and 3.0 INR.  After completing the four weeks of Coumadin, the patient may stop the Coumadin and resume their 81 mg Aspirin daily. 12/06/12   Jamarius Saha, PA-C    Take Coumadin for 4 weeks and then discontinue.  The dose may need to be adjusted based upon the INR.  Please follow the INR and titrate Coumadin dose for a therapeutic range between 2.0 and 3.0 INR.  After completing the 4 weeks of Coumadin, the patient may stop the Coumadin and resume their 81 mg Aspirin daily.  Continue Lovenox injections until the INR is therapeutic at or greater than 2.0.  When INR reaches the therapeutic level of equal to or greater than 2.0, the patient may discontinue the Lovenox injections.  Discharge to CIR  Diet - Cardiac diet  Follow up - in 2weeks  Activity - WBAT  Disposition - Rehab  Condition Upon Discharge - Good  D/C Meds - See DC Summary  DVT Prophylaxis - Lovenox and Coumadin      Discharge Orders   Future Orders Complete By Expires   Call MD / Call 911  As directed    Comments:     If you experience  chest pain or shortness of breath, CALL 911 and be transported to the hospital emergency room.  If you develope a fever above 101 F, pus (white drainage) or increased drainage or redness at the wound, or calf pain, call your surgeon's office.   Change dressing  As directed    Comments:     Change dressing daily with sterile 4 x 4 inch gauze dressing and apply TED hose. Do not submerge the incision under water.   Constipation Prevention  As directed    Comments:  Drink plenty of fluids.  Prune juice may be helpful.  You may use a stool softener, such as Colace (over the counter) 100 mg twice a day.  Use MiraLax (over the counter) for constipation as needed.   CPM  As directed    Comments:     Continuous passive motion machine (CPM):      Use the CPM from 6 hours per day.      You may increase by 5 degrees per day.  You may break it up into 2 or 3 sessions per day.      Use CPM for 2-3 weeks or until you are told to stop.   Diet - low sodium heart healthy  As directed    Discharge instructions  As directed    Comments:     Pick up stool softner and laxative for home. Do not submerge incision under water. May shower. Continue to use ice for pain and swelling from surgery.  Take Coumadin for four weeks and then discontinue.  The dose may need to be adjusted based upon the INR.  Please follow the INR and titrate Coumadin dose for a therapeutic range between 2.0 and 3.0 INR.  After completing the four weeks of Coumadin, the patient may stop the Coumadin and resume their 81 mg Aspirin daily.  Continue Lovenox injections until the INR is therapeutic at or greater than 2.0.  When INR reaches the therapeutic level of equal to or greater than 2.0, the patient may discontinue the Lovenox injections.  When discharged from the skilled rehab facility, please have the facility set up the patient's Home Health Physical Therapy prior to being released.  Also provide the patient with their medications at  time of release from the facility to include their pain medication, the muscle relaxants, and their blood thinner medication.  If the patient is still at the rehab facility at time of follow up appointment, please also assist the patient in arranging follow up appointment in our office and any transportation needs.   Do not put a pillow under the knee. Place it under the heel.  As directed    Do not sit on low chairs, stoools or toilet seats, as it may be difficult to get up from low surfaces  As directed    Driving restrictions  As directed    Comments:     No driving until released by the physician.   Increase activity slowly as tolerated  As directed    Lifting restrictions  As directed    Comments:     No lifting until released by the physician.   Patient may shower  As directed    Comments:     You may shower without a dressing once there is no drainage.  Do not wash over the wound.  If drainage remains, do not shower until drainage stops.   TED hose  As directed    Comments:     Use stockings (TED hose) for 3 weeks on both leg(s).  You may remove them at night for sleeping.   Weight bearing as tolerated  As directed    Questions:     Laterality:     Extremity:         Medication List    STOP taking these medications       diclofenac 75 MG EC tablet  Commonly known as:  VOLTAREN     HYDROcodone-acetaminophen 5-325 MG per tablet  Commonly known as:  NORCO/VICODIN  multivitamin with minerals Tabs tablet      TAKE these medications       acetaminophen 325 MG tablet  Commonly known as:  TYLENOL  Take 2 tablets (650 mg total) by mouth every 6 (six) hours as needed for mild pain (or Fever >/= 101).     bisacodyl 10 MG suppository  Commonly known as:  DULCOLAX  Place 1 suppository (10 mg total) rectally daily as needed for moderate constipation.     diazepam 5 MG tablet  Commonly known as:  VALIUM  Take 1 tablet (5 mg total) by mouth every 6 (six) hours as needed for  anxiety.     dicyclomine 10 MG capsule  Commonly known as:  BENTYL  TAKE ONE CAPSULE BY MOUTH 4 TIMES DAILY BEFORE MEAL(S) AT BEDTIME     dicyclomine 10 MG capsule  Commonly known as:  BENTYL  Take 10 mg by mouth 4 (four) times daily -  with meals and at bedtime.     DSS 100 MG Caps  Take 100 mg by mouth 2 (two) times daily.     enoxaparin 30 MG/0.3ML injection  Commonly known as:  LOVENOX  Inject 0.3 mLs (30 mg total) into the skin every 12 (twelve) hours. Continue Lovenox injections until the INR is therapeutic at or greater than 2.0.  When INR reaches the therapeutic level of equal to or greater than 2.0, the patient may discontinue the Lovenox injections.     Fluticasone-Salmeterol 100-50 MCG/DOSE Aepb  Commonly known as:  ADVAIR  Inhale 1 puff into the lungs 2 (two) times daily.     lansoprazole 15 MG capsule  Commonly known as:  PREVACID  Take 15 mg by mouth daily at 12 noon.     methocarbamol 500 MG tablet  Commonly known as:  ROBAXIN  Take 1 tablet (500 mg total) by mouth every 6 (six) hours as needed for muscle spasms.     ondansetron 4 MG tablet  Commonly known as:  ZOFRAN  Take 1 tablet (4 mg total) by mouth every 6 (six) hours as needed for nausea.     oxyCODONE 5 MG immediate release tablet  Commonly known as:  Oxy IR/ROXICODONE  Take 1-4 tablets (5-20 mg total) by mouth every 3 (three) hours as needed for moderate pain or severe pain.     polyethylene glycol packet  Commonly known as:  MIRALAX / GLYCOLAX  Take 17 g by mouth daily as needed for moderate constipation.     sodium phosphate 7-19 GM/118ML Enem  Place 1 enema rectally daily as needed for severe constipation.     traMADol 50 MG tablet  Commonly known as:  ULTRAM  Take 2 tablets (100 mg total) by mouth every 8 (eight) hours as needed (mild pain).     warfarin 4 MG tablet  Commonly known as:  COUMADIN  Take 1 tablet (4 mg total) by mouth one time only at 6 PM. Take Coumadin for four weeks and  then discontinue.  The dose may need to be adjusted based upon the INR.  Please follow the INR and titrate Coumadin dose for a therapeutic range between 2.0 and 3.0 INR.  After completing the four weeks of Coumadin, the patient may stop the Coumadin and resume their 81 mg Aspirin daily.       Follow-up Information   Follow up with Loanne Drilling, MD. Schedule an appointment as soon as possible for a visit in 2 weeks.   Specialty:  Orthopedic  Surgery   Contact information:   [address] [place] [address] [phone]       Signed: Patrica Duel 12/06/2012, 10:53 AM

## 2012-12-06 NOTE — Telephone Encounter (Signed)
Notified patient we need a tier reduction form from Insurance to initiate the process and have it faxed to this office patient voiced understanding and will proceed.

## 2012-12-07 ENCOUNTER — Inpatient Hospital Stay (HOSPITAL_COMMUNITY): Payer: Medicare Other | Admitting: Occupational Therapy

## 2012-12-07 ENCOUNTER — Inpatient Hospital Stay (HOSPITAL_COMMUNITY): Payer: Medicare Other | Admitting: *Deleted

## 2012-12-07 DIAGNOSIS — Z96659 Presence of unspecified artificial knee joint: Secondary | ICD-10-CM

## 2012-12-07 DIAGNOSIS — M1710 Unilateral primary osteoarthritis, unspecified knee: Secondary | ICD-10-CM

## 2012-12-07 LAB — CBC WITH DIFFERENTIAL/PLATELET
Basophils Absolute: 0 10*3/uL (ref 0.0–0.1)
Basophils Relative: 0 % (ref 0–1)
Eosinophils Absolute: 0.4 10*3/uL (ref 0.0–0.7)
Eosinophils Relative: 3 % (ref 0–5)
HCT: 24.8 % — ABNORMAL LOW (ref 39.0–52.0)
Hemoglobin: 8.6 g/dL — ABNORMAL LOW (ref 13.0–17.0)
Lymphocytes Relative: 16 % (ref 12–46)
Lymphs Abs: 1.9 10*3/uL (ref 0.7–4.0)
MCH: 32.1 pg (ref 26.0–34.0)
MCHC: 34.7 g/dL (ref 30.0–36.0)
MCV: 92.5 fL (ref 78.0–100.0)
Monocytes Absolute: 1.9 10*3/uL — ABNORMAL HIGH (ref 0.1–1.0)
Monocytes Relative: 16 % — ABNORMAL HIGH (ref 3–12)
Neutro Abs: 7.5 10*3/uL (ref 1.7–7.7)
Neutrophils Relative %: 65 % (ref 43–77)
Platelets: 295 10*3/uL (ref 150–400)
RBC: 2.68 MIL/uL — ABNORMAL LOW (ref 4.22–5.81)
RDW: 12.9 % (ref 11.5–15.5)
WBC: 11.7 10*3/uL — ABNORMAL HIGH (ref 4.0–10.5)

## 2012-12-07 LAB — PROTIME-INR
INR: 2.34 — ABNORMAL HIGH (ref 0.00–1.49)
Prothrombin Time: 24.9 seconds — ABNORMAL HIGH (ref 11.6–15.2)

## 2012-12-07 LAB — COMPREHENSIVE METABOLIC PANEL
ALT: 127 U/L — ABNORMAL HIGH (ref 0–53)
AST: 119 U/L — ABNORMAL HIGH (ref 0–37)
Albumin: 2.5 g/dL — ABNORMAL LOW (ref 3.5–5.2)
Alkaline Phosphatase: 166 U/L — ABNORMAL HIGH (ref 39–117)
BUN: 15 mg/dL (ref 6–23)
CO2: 26 mEq/L (ref 19–32)
Calcium: 8.5 mg/dL (ref 8.4–10.5)
Chloride: 95 mEq/L — ABNORMAL LOW (ref 96–112)
Creatinine, Ser: 1 mg/dL (ref 0.50–1.35)
GFR calc Af Amer: 89 mL/min — ABNORMAL LOW (ref >90)
GFR calc non Af Amer: 77 mL/min — ABNORMAL LOW (ref >90)
Glucose, Bld: 110 mg/dL — ABNORMAL HIGH (ref 70–99)
Potassium: 3.8 mEq/L (ref 3.5–5.1)
Sodium: 131 mEq/L — ABNORMAL LOW (ref 135–145)
Total Bilirubin: 0.9 mg/dL (ref 0.3–1.2)
Total Protein: 6 g/dL (ref 6.0–8.3)

## 2012-12-07 MED ORDER — WARFARIN SODIUM 2.5 MG PO TABS
2.5000 mg | ORAL_TABLET | Freq: Once | ORAL | Status: AC
  Administered 2012-12-07: 2.5 mg via ORAL
  Filled 2012-12-07: qty 1

## 2012-12-07 MED ORDER — BISACODYL 10 MG RE SUPP
10.0000 mg | Freq: Once | RECTAL | Status: DC
  Filled 2012-12-07: qty 1

## 2012-12-07 MED ORDER — BISACODYL 10 MG RE SUPP
10.0000 mg | Freq: Every day | RECTAL | Status: DC | PRN
  Administered 2012-12-08 – 2012-12-09 (×2): 10 mg via RECTAL
  Filled 2012-12-07: qty 1

## 2012-12-07 NOTE — Progress Notes (Addendum)
Subjective/Complaints: Voiding well +constipation, reluctant to use colace secondary to IBS  Objective: Vital Signs: Blood pressure 105/68, pulse 102, temperature 98.8 F (37.1 C), temperature source Oral, resp. rate 16, height 5\' 9"  (1.753 m), weight 98.431 kg (217 lb), SpO2 92.00%. No results found. Results for orders placed during the hospital encounter of 12/06/12 (from the past 72 hour(s))  CBC WITH DIFFERENTIAL     Status: Abnormal (Preliminary result)   Collection Time    12/07/12  7:15 AM      Result Value Range   WBC 11.7 (*) 4.0 - 10.5 K/uL   Comment: WHITE COUNT CONFIRMED ON SMEAR   RBC 2.68 (*) 4.22 - 5.81 MIL/uL   Hemoglobin 8.6 (*) 13.0 - 17.0 g/dL   HCT 08.6 (*) 57.8 - 46.9 %   MCV 92.5  78.0 - 100.0 fL   MCH 32.1  26.0 - 34.0 pg   MCHC 34.7  30.0 - 36.0 g/dL   RDW 62.9  52.8 - 41.3 %   Platelets 295  150 - 400 K/uL   Comment: PLATELET COUNT CONFIRMED BY SMEAR   Neutrophils Relative % 65  43 - 77 %   Lymphocytes Relative 16  12 - 46 %   Monocytes Relative 16 (*) 3 - 12 %   Eosinophils Relative 3  0 - 5 %   Basophils Relative 0  0 - 1 %   Neutro Abs 7.5  1.7 - 7.7 K/uL   Lymphs Abs 1.9  0.7 - 4.0 K/uL   Monocytes Absolute 1.9 (*) 0.1 - 1.0 K/uL   Eosinophils Absolute 0.4  0.0 - 0.7 K/uL   Basophils Absolute 0.0  0.0 - 0.1 K/uL   RBC Morphology POLYCHROMASIA PRESENT    COMPREHENSIVE METABOLIC PANEL     Status: Abnormal   Collection Time    12/07/12  7:15 AM      Result Value Range   Sodium 131 (*) 135 - 145 mEq/L   Potassium 3.8  3.5 - 5.1 mEq/L   Chloride 95 (*) 96 - 112 mEq/L   CO2 26  19 - 32 mEq/L   Glucose, Bld 110 (*) 70 - 99 mg/dL   BUN 15  6 - 23 mg/dL   Creatinine, Ser 2.44  0.50 - 1.35 mg/dL   Calcium 8.5  8.4 - 01.0 mg/dL   Total Protein 6.0  6.0 - 8.3 g/dL   Albumin 2.5 (*) 3.5 - 5.2 g/dL   AST 272 (*) 0 - 37 U/L   ALT 127 (*) 0 - 53 U/L   Alkaline Phosphatase 166 (*) 39 - 117 U/L   Total Bilirubin 0.9  0.3 - 1.2 mg/dL   GFR calc non Af  Amer 77 (*) >90 mL/min   GFR calc Af Amer 89 (*) >90 mL/min   Comment: (NOTE)     The eGFR has been calculated using the CKD EPI equation.     This calculation has not been validated in all clinical situations.     eGFR's persistently <90 mL/min signify possible Chronic Kidney     Disease.  PROTIME-INR     Status: Abnormal   Collection Time    12/07/12  7:15 AM      Result Value Range   Prothrombin Time 24.9 (*) 11.6 - 15.2 seconds   INR 2.34 (*) 0.00 - 1.49     HEENT: normal Cardio: RRR and no murmur Resp: CTA B/L and unlabored GI: BS positive, Distention and non tender Extremity:  Pulses  positive and Edema mild pedal edema Skin:   Wound C/D/I and bilateral ant knee Neuro: Alert/Oriented, Cranial Nerve II-XII normal, Normal Sensory and Abnormal Motor 3- Left HF, 2-/5 R HF, 3-/5 B KE, 4/5 bilateral ADF,APF Musc/Skel:  Extremity tender bilateral knee Gen NAD   Assessment/Plan: 1. 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 Physiatrist is providing close team supervision and 24 hour management of active medical problems listed below. Physiatrist and rehab team continue to assess barriers to discharge/monitor patient progress toward functional and medical goals. FIM:                   Comprehension Comprehension Mode: Auditory  Expression Expression Mode: Verbal Expression: 7-Expresses complex ideas: With no assist  Social Interaction Social Interaction: 7-Interacts appropriately with others - No medications needed.  Problem Solving Problem Solving: 6-Solves complex problems: With extra time  Memory Memory: 7-Complete Independence: No helper  Medical Problem List and Plan:  1. 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 DVT Prophylaxis/Anticoagulation: Coumadin for  DVT prophylaxis. Continue Lovenox for INR greater than 2.00. Check vascular study  3. Pain Management: Oxycodone and Robaxin as needed. Monitor with increased mobility  4. Neuropsych: This patient is capable of making decisions on his own behalf.  5. Acute blood loss anemia. Followup CBC  6. GERD. Prevacid  7. Asthma.Dulera 2 puffs twice a day. No complaints of shortness of breath  8. Irritable bowel syndrome. Bentyl 10 mg 3 times a day.Will hold and give dulc supp  9.  Elevated LFT will d/c acetaminophen    LOS (Days) 1 A FACE TO FACE EVALUATION WAS PERFORMED  Tiaria Biby E 12/07/2012, 8:59 AM

## 2012-12-07 NOTE — Evaluation (Signed)
Occupational Therapy Assessment and Plan  Patient Details  Name: Nathan Ramirez MRN: [id number] Date of Birth: [birth year]  OT Diagnosis: acute pain and muscle weakness (generalized) Rehab Potential: Rehab Potential: Excellent ELOS: 1.5 weeks   Today's Date: 12/07/2012 Time: 0900-1000 Time Calculation (min): 60 min  Problem List:  Patient Active Problem List   Diagnosis Date Noted  . Urinary retention 12/06/2012  . Status post total bilateral knee replacement 12/06/2012  . Hyponatremia 12/03/2012  . Sinus tachycardia by electrocardiogram 12/03/2012  . Postoperative anemia due to acute blood loss 12/02/2012  . OA (osteoarthritis) of knee 12/01/2012  . Medicare welcome visit 11/19/2012  . Other and unspecified hyperlipidemia 11/17/2012  . Preoperative evaluation to rule out surgical contraindication 10/11/2012  . Irritable bowel syndrome 10/01/2012  . GERD (gastroesophageal reflux disease) 09/19/2012  . Physical exam 06/15/2012  . Hyperlipidemia LDL goal <100 11/17/2011  . Obesity (BMI 30.0-34.9) 11/17/2011  . ALLERGIC RHINITIS CAUSE UNSPECIFIED 02/20/2010  . ASTHMA 02/20/2010    Past Medical History:  Past Medical History  Diagnosis Date  . Arthritis   . Chicken pox   . Allergy   . Elevated blood pressure reading   . GERD (gastroesophageal reflux disease)   . Hypercholesteremia     borderline  . Asthma     "allergic"  . Headache(784.0)     occasional tension  . PONV (postoperative nausea and vomiting)    Past Surgical History:  Past Surgical History  Procedure Laterality Date  . Lumbar disc surgery  2008  . Knee arthroscopy  2009& 2010    RIGHT AND LEFT KNEE  . Appendectomy  1972  . Total knee arthroplasty Bilateral 12/01/2012    Procedure: TOTAL KNEE BILATERAL;  Surgeon: Loanne Drilling, MD;  Location: [facility];  Service: Orthopedics;  Laterality: Bilateral;    Assessment & Plan Clinical Impression: Nathan Ramirez is a 67 y.o. right-handed male admitted  12/01/2012 with end-stage degenerative changes of both knees. Symptoms reported include pain, aching, stiffness with instability. No relief with conservative care. Underwent bilateral total knee arthroplasty 12/01/2012 per Dr. Despina Hick. Postoperative pain management with epidural catheter since removed. Weightbearing as tolerated bilateral lower extremities Patient transferred to CIR on 12/06/2012 .    Patient currently requires total with basic self-care skills of toileting and LB dressing secondary to muscle weakness, decreased cardiorespiratoy endurance and decreased standing balance.  Prior to hospitalization, patient was independent with all ADLS, was driving, and taking care of his horses.   Patient will benefit from skilled intervention to increase independence with basic self-care skills prior to discharge home with care partner.  Anticipate patient will require assistance with cooking and homemaking. and follow up home health.  OT - End of Session Endurance Deficit: Yes Endurance Deficit Description: Pt needing rest between each exercise OT Assessment Rehab Potential: Excellent OT Patient demonstrates impairments in the following area(s): Balance;Endurance;Pain OT Basic ADL's Functional Problem(s): Bathing;Dressing;Toileting OT Transfers Functional Problem(s): Toilet;Tub/Shower OT Additional Impairment(s): None OT Plan OT Intensity: Minimum of 1-2 x/day, 45 to 90 minutes OT Frequency: 5 out of 7 days OT Duration/Estimated Length of Stay: 1.5 weeks OT Treatment/Interventions: Balance/vestibular training;Discharge planning;DME/adaptive equipment instruction;Functional mobility training;Pain management;Patient/family education;Self Care/advanced ADL retraining;Therapeutic Activities;Therapeutic Exercise;UE/LE Strength taining/ROM OT Self Feeding Anticipated Outcome(s): pt is I OT Basic Self-Care Anticipated Outcome(s): mod I OT Toileting Anticipated Outcome(s): mod I OT Bathroom Transfers  Anticipated Outcome(s): mod I OT Recommendation Patient destination: Home Follow Up Recommendations: Home health OT Equipment Recommended: 3 in 1 bedside comode  Skilled Therapeutic Intervention Pt seen for the initial evaluation and ADL retraining of bathing at shower level and dressing from w/c level.  Pt was in quite a bit of pain but stated that he had received pain meds earlier.  He was able to sit up in bed without assist, He needed bed elevated to stand from bed.  He ambulated a few steps into shower with RW.  Pt was fatigued after shower so he scooted to w/c.  He then needed total assist with LB dressing but was able to stand from the w/c with min-mod assist.  Pt's pain levels had increased, reported to nursing that he may need more medication. Pt then propelled himself to his sink to complete grooming.  OT Evaluation Precautions/Restrictions  Precautions Precautions: Knee;Fall Required Braces or Orthoses: Knee Immobilizer - Right;Knee Immobilizer - Left Knee Immobilizer - Right: Discontinue once straight leg raise with < 10 degree lag Knee Immobilizer - Left: Discontinue once straight leg raise with < 10 degree lag Restrictions Weight Bearing Restrictions: No Other Position/Activity Restrictions: WBAT   Vital Signs Oxygen Therapy SpO2: 92 % O2 Device: None (Room air) Pain Pain Assessment Pain Score: 7  Pain Type: Surgical pain Pain Location: Knee Pain Orientation: Right;Left Pain Descriptors / Indicators: Aching Pain Onset: On-going Pain Intervention(s): RN made aware Home Living/Prior Functioning Home Living Available Help at Discharge: Available 24 hours/day;Family Type of Home: House Home Access: Stairs to enter Secretary/administrator of Steps: 5 steps Entrance Stairs-Rails: Right Home Layout: One level Additional Comments: Has RW, shower seat, wife has back problems  Lives With: Spouse Prior Function Level of Independence: Independent with basic  ADLs;Independent with gait;Independent with transfers  Able to Take Stairs?: Yes Driving: Yes Vocation: Retired Leisure: Hobbies-yes (Comment) Comments: Has horses ADL  refer to FIM Vision/Perception  Vision - History Baseline Vision: Wears glasses all the time Patient Visual Report: No change from baseline Vision - Assessment Eye Alignment: Within Functional Limits Perception Perception: Within Functional Limits Praxis Praxis: Intact  Cognition Overall Cognitive Status: Within Functional Limits for tasks assessed Arousal/Alertness: Awake/alert Orientation Level: Oriented X4 Safety/Judgment: Appears intact Sensation Sensation Light Touch: Appears Intact Stereognosis: Appears Intact Hot/Cold: Appears Intact Proprioception: Appears Intact Coordination Gross Motor Movements are Fluid and Coordinated: Yes Fine Motor Movements are Fluid and Coordinated: Yes Motor  Motor Motor: Within Functional Limits Mobility  Bed Mobility Bed Mobility: Sit to Supine;Supine to Sit Sit to Supine: 3: Mod assist Sit to Supine - Details (indicate cue type and reason): Pt needing bil LE lifting/lowering assist  Trunk/Postural Assessment  Cervical Assessment Cervical Assessment: Within Functional Limits Thoracic Assessment Thoracic Assessment: Within Functional Limits Lumbar Assessment Lumbar Assessment: Within Functional Limits (H/o lumbar disc surgery, but no current complaints) Postural Control Postural Control: Within Functional Limits  Balance Static Standing Balance Static Standing - Level of Assistance: 5: Stand by assistance Dynamic Standing Balance Dynamic Standing - Level of Assistance: 3: Mod assist (can not fully extend knees) Extremity/Trunk Assessment RUE Assessment RUE Assessment: Within Functional Limits LUE Assessment LUE Assessment: Within Functional Limits  FIM:  FIM - Grooming Grooming Steps: Wash, rinse, dry face;Wash, rinse, dry hands;Oral care, brush teeth,  clean dentures Grooming: 6: More than reasonable amount of time FIM - Bathing Bathing Steps Patient Completed: Chest;Right Arm;Left Arm;Abdomen;Left upper leg;Right upper leg;Buttocks;Front perineal area Bathing: 4: Min-Patient completes 8-9 69f 10 parts or 75+ percent FIM - Upper Body Dressing/Undressing Upper body dressing/undressing steps patient completed: Thread/unthread right sleeve of pullover shirt/dresss;Thread/unthread left sleeve of pullover  shirt/dress;Put head through opening of pull over shirt/dress;Pull shirt over trunk Upper body dressing/undressing: 5: Set-up assist to: Obtain clothing/put away FIM - Lower Body Dressing/Undressing Lower body dressing/undressing: 1: Total-Patient completed less than 25% of tasks FIM - Toileting Toileting: 0: Activity did not occur FIM - Banker Devices: Bed rails;Walker (elevated bed) Bed/Chair Transfer: 5: Supine > Sit: Supervision (verbal cues/safety issues);3: Bed > Chair or W/C: Mod A (lift or lower assist) FIM - Archivist Transfers: 0-Activity did not occur FIM - Secretary/administrator Devices: Shower chair;Grab bars Tub/shower Transfers: 3-Into Tub/Shower: Mod A (lift or lower/lift 2 legs);3-Out of Tub/Shower: Mod A (lift or lower/lift 2 legs)   Refer to Care Plan for Long Term Goals  Recommendations for other services: None  Discharge Criteria: Patient will be discharged from OT if patient refuses treatment 3 consecutive times without medical reason, if treatment goals not met, if there is a change in medical status, if patient makes no progress towards goals or if patient is discharged from hospital.  The above assessment, treatment plan, treatment alternatives and goals were discussed and mutually agreed upon: by patient  [facility] 12/07/2012, 11:38 AM

## 2012-12-07 NOTE — Care Management Note (Signed)
[facility] Individual Statement of Services  Patient Name:  Nathan Ramirez  Date:  12/07/2012  Welcome to the [facility].  Our goal is to provide you with an individualized program based on your diagnosis and situation, designed to meet your specific needs.  With this comprehensive rehabilitation program, you will be expected to participate in at least 3 hours of rehabilitation therapies Monday-Friday, with modified therapy programming on the weekends.  Your rehabilitation program will include the following services:  Physical Therapy (PT), Occupational Therapy (OT), 24 hour per day rehabilitation nursing, Case Management (Social Worker), Rehabilitation Medicine, Nutrition Services and Pharmacy Services  Weekly team conferences will be held on Wednesday to discuss your progress.  Your Social Worker will talk with you frequently to get your input and to update you on team discussions.  Team conferences with you and your family in attendance may also be held.  Expected length of stay: 10 DAYS Overall anticipated outcome: MOD/I-SUPERVISION LEVEL  Depending on your progress and recovery, your program may change. Your Social Worker will coordinate services and will keep you informed of any changes. Your Social Worker's name and contact numbers are listed  below.  The following services may also be recommended but are not provided by the [facility]:   Driving Evaluations  Home Health Rehabiltiation Services  Outpatient Rehabilitation Services    Arrangements will be made to provide these services after discharge if needed.  Arrangements include referral to agencies that provide these services.  Your insurance has been verified to be:  Medicare & [employer] Your primary doctor is:  DR Duncan Dull  Pertinent information will be shared with your doctor and your insurance company.  Social Worker:  Dossie Der, SW  [phone] or [phone]  Information discussed with and copy given to patient by: Lucy Chris, 12/07/2012, 9:47 AM

## 2012-12-07 NOTE — Evaluation (Addendum)
Physical Therapy Assessment and Plan  Patient Details  Name: Nathan Ramirez MRN: [id number] Date of Birth: 29-Nov-1945  PT Diagnosis: Difficulty walking, Muscle weakness and Pain in joint - bil knees Rehab Potential: Good ELOS: 10 days   Today's Date: 12/07/2012 Time: 1020-1135 and 13:10-14:00 (missed due to toileting)  Time Calculation (min): 75 min and  Problem List:  Patient Active Problem List   Diagnosis Date Noted  . Urinary retention 12/06/2012  . Status post total bilateral knee replacement 12/06/2012  . Hyponatremia 12/03/2012  . Sinus tachycardia by electrocardiogram 12/03/2012  . Postoperative anemia due to acute blood loss 12/02/2012  . OA (osteoarthritis) of knee 12/01/2012  . Medicare welcome visit 11/19/2012  . Other and unspecified hyperlipidemia 11/17/2012  . Preoperative evaluation to rule out surgical contraindication 10/11/2012  . Irritable bowel syndrome 10/01/2012  . GERD (gastroesophageal reflux disease) 09/19/2012  . Physical exam 06/15/2012  . Hyperlipidemia LDL goal <100 11/17/2011  . Obesity (BMI 30.0-34.9) 11/17/2011  . ALLERGIC RHINITIS CAUSE UNSPECIFIED 02/20/2010  . ASTHMA 02/20/2010    Past Medical History:  Past Medical History  Diagnosis Date  . Arthritis   . Chicken pox   . Allergy   . Elevated blood pressure reading   . GERD (gastroesophageal reflux disease)   . Hypercholesteremia     borderline  . Asthma     "allergic"  . Headache(784.0)     occasional tension  . PONV (postoperative nausea and vomiting)    Past Surgical History:  Past Surgical History  Procedure Laterality Date  . Lumbar disc surgery  2008  . Knee arthroscopy  2009& 2010    RIGHT AND LEFT KNEE  . Appendectomy  1972  . Total knee arthroplasty Bilateral 12/01/2012    Procedure: TOTAL KNEE BILATERAL;  Surgeon: Loanne Drilling, MD;  Location: [facility];  Service: Orthopedics;  Laterality: Bilateral;    Assessment & Plan Clinical Impression: Nathan Ramirez is a 67 y.o. right-handed male admitted 12/01/2012 with end-stage degenerative changes of both knees. Symptoms reported include pain, aching, stiffness with instability. No relief with conservative care. Underwent bilateral total knee arthroplasty 12/01/2012 per Dr. Despina Hick. Postoperative pain management with epidural catheter since removed. Weightbearing as tolerated bilateral lower extremities. Coumadin for DVT prophylaxis x4 weeks and then began aspirin 81 mg daily. Patient remained on subcutaneous Lovenox until INR greater than 2.00. Acute blood loss anemia 10.0 and monitored. Physical and occupational therapy evaluations completed an ongoing with recommendations of physical medicine rehabilitation consult to consider inpatient rehabilitation services. Patient was felt to be a good candidate for inpatient rehabilitation services was admitted for comprehensive rehabilitation program   Patient transferred to CIR on 12/06/2012 .   Patient currently requires mod with mobility secondary to muscle weakness and muscle joint tightness, decreased cardiorespiratoy endurance and decreased standing balance.  Prior to hospitalization, patient was independent  with mobility and lived with Spouse in a House home.  Home access is 5 stepsStairs to enter.  Patient will benefit from skilled PT intervention to maximize safe functional mobility, minimize fall risk and decrease caregiver burden for planned discharge home with 24 hour supervision.  Anticipate patient will benefit from follow up HH at discharge.  PT - End of Session Activity Tolerance: Tolerates 10 - 20 min activity with multiple rests Endurance Deficit: Yes Endurance Deficit Description: Pt needing rest between each exercise PT Assessment Rehab Potential: Good Barriers to Discharge: Other (comment);Decreased caregiver support (5 steps to enter, wife with h/o back  problems) PT Patient demonstrates impairments in the following area(s):  Balance;Edema;Endurance;Pain;Safety PT Transfers Functional Problem(s): Bed Mobility;Bed to Chair;Car;Furniture PT Locomotion Functional Problem(s): Ambulation;Wheelchair Mobility;Stairs PT Plan PT Intensity: Minimum of 1-2 x/day ,45 to 90 minutes PT Frequency: 5 out of 7 days PT Duration Estimated Length of Stay: 10 days PT Treatment/Interventions: Ambulation/gait training;Balance/vestibular training;Community reintegration;Discharge planning;DME/adaptive equipment instruction;Functional electrical stimulation;Functional mobility training;Neuromuscular re-education;Pain management;Patient/family education;Psychosocial support;Skin care/wound management;Splinting/orthotics;Stair training;Therapeutic Activities;Therapeutic Exercise;UE/LE Strength taining/ROM;Wheelchair propulsion/positioning PT Transfers Anticipated Outcome(s): Supervision PT Locomotion Anticipated Outcome(s): Supervision with RW PT Recommendation Follow Up Recommendations: Home health PT;24 hour supervision/assistance Patient destination: Home Equipment Recommended: Rolling walker with 5" wheels;Other (comment) (PT may be able to use wife's RW, need to assess fit) Equipment Details: Has wfie's RW and shower chair  Skilled Therapeutic Intervention 1:2 TX initiated upon eval session. Pt in significant pain this morning, 65/10 bil knee pain. Modified tx and provided rest breaks prn, ice following tx. Tx focused on pt education, therex for strength and ROM as well as transfer training.  Educated pt on optimal positioning for resting and mobility in bed and chair, locked bed in extended LE position.   Performed seated and supine therex including each of the following 2x10 with cues and in available ROM:   -ankle pumps, quad sets, glute sets, SAQ, heel slides with assist, and SLR with assist for LLE only.   Educated pt on safe transfer training technique with RW as well as gait with RW.   2:2 Pt resting in bed, agreeable to PT,  but still with pain and fatigue. Supine >sit with Min A.  Supine SLR bil x10 without assist for RLE and no lag > DC'd R KI Sit<>stand with Min A from elevated surface.   Gait 2x150' with RW and min-guard and cues for scapular depression. Increased time required, imropved step length with step-through pattern  Demonstrated stairs, pt performed x4 with bil rails and Mod A for steadying with descent, step-to pattern.   Kinetron for strength and ROM x29min 80cm/sec  Standing HS curls bil x10 with UE support Seated LAQ with cues for technique x10. Passive seated stretch for knee ex x69min each side and cues to relax LE muscles.   Pt returned to bed with Mod A for LE lifting, set-up on CPM on R, increased flexion 10 degrees.  Left with wife and all needs in reach.    PT Evaluation Precautions/Restrictions Precautions Precautions: Knee;Fall Required Braces or Orthoses: Knee Immobilizer - Right;Knee Immobilizer - Left Knee Immobilizer - Right: Discontinue once straight leg raise with < 10 degree lag Knee Immobilizer - Left: Discontinue once straight leg raise with < 10 degree lag Restrictions Weight Bearing Restrictions: No Other Position/Activity Restrictions: WBAT General   Vital Signs  Pain Pain Assessment Pain Score: 7  Pain Type: Surgical pain Pain Location: Knee Pain Orientation: Right;Left Pain Descriptors / Indicators: Aching Pain Onset: On-going Pain Intervention(s): RN made aware Home Living/Prior Functioning Home Living Available Help at Discharge: Available 24 hours/day;Family Type of Home: House Home Access: Stairs to enter Secretary/administrator of Steps: 5 steps Entrance Stairs-Rails: Right Home Layout: One level Additional Comments: Has RW, shower seat, wife has back problems  Lives With: Spouse Prior Function Level of Independence: Independent with basic ADLs;Independent with gait;Independent with transfers  Able to Take Stairs?: Yes Driving: Yes Vocation:  Retired Leisure: Hobbies-yes (Comment) Comments: Has horses Vision/Perception  Vision - History Baseline Vision: Wears glasses all the time Patient Visual Report: No change from baseline Vision - Assessment Eye  Alignment: Within Functional Limits Perception Perception: Within Functional Limits Praxis Praxis: Intact  Cognition Overall Cognitive Status: Within Functional Limits for tasks assessed Arousal/Alertness: Awake/alert Orientation Level: Oriented X4 Safety/Judgment: Appears intact Sensation Sensation Light Touch: Appears Intact Stereognosis: Appears Intact Hot/Cold: Appears Intact Proprioception: Appears Intact Coordination Gross Motor Movements are Fluid and Coordinated: Yes Fine Motor Movements are Fluid and Coordinated: Yes Motor  Motor Motor: Within Functional Limits  Mobility Bed Mobility Bed Mobility: Sit to Supine;Supine to Sit Supine to Sit: 3: Mod assist Sit to Supine: 3: Mod assist Sit to Supine - Details (indicate cue type and reason): Pt needing bil LE lifting/lowering assist Transfers Transfers: Yes Stand Pivot Transfers: 2: Max assist Stand Pivot Transfer Details (indicate cue type and reason): Cues for scooting edge of chair, lifting technique, and assist needed for lifting as well as steadying during turn Locomotion  Ambulation Ambulation: Yes Ambulation/Gait Assistance: 4: Min assist Ambulation Distance (Feet): 150 Feet Assistive device: Rolling walker Ambulation/Gait Assistance Details: Pt with step-to pattern, needing cues for keeping RW close and sequence Stairs / Additional Locomotion Stairs: No Wheelchair Mobility Wheelchair Mobility: Yes Wheelchair Assistance: 5: Investment banker, operational: Both upper extremities Wheelchair Parts Management: Needs assistance Distance: 150  Trunk/Postural Assessment  Cervical Assessment Cervical Assessment: Within Functional Limits Thoracic Assessment Thoracic Assessment: Within Functional  Limits Lumbar Assessment Lumbar Assessment: Within Functional Limits (H/o lumbar disc surgery, but no current complaints) Postural Control Postural Control: Within Functional Limits  Balance Balance Balance Assessed: Yes Dynamic Sitting Balance Dynamic Sitting - Balance Support: Right upper extremity supported;Left upper extremity supported;Feet supported Dynamic Sitting - Level of Assistance: 5: Stand by assistance Static Standing Balance Static Standing - Balance Support: Bilateral upper extremity supported;During functional activity Static Standing - Level of Assistance: 5: Stand by assistance Static Standing - Comment/# of Minutes: Heavy UE reliance, cues for posture, assist for trunk steadying over weak LEs Dynamic Standing Balance Dynamic Standing - Level of Assistance: 3: Mod assist (can not fully extend knees) Extremity Assessment  RUE Assessment RUE Assessment: Within Functional Limits LUE Assessment LUE Assessment: Within Functional Limits RLE Assessment RLE Assessment: Exceptions to [facility] - edema, warm to the touch, but not red RLE PROM (degrees) RLE Overall PROM Comments: Knee ext -10 degrees, flexion ~80 degrees RLE Strength RLE Overall Strength Comments: Difficult to assess due to pain, but ankle ~5/5, hip 3/5, knee 2+/5 LLE Assessment LLE Assessment: Exceptions to [facility] - edema, warm to the touch, but not red LLE PROM (degrees) LLE Overall PROM Comments: -10 degrees ext, 80 degrees flexion LLE Strength LLE Overall Strength Comments: Difficult to assess due to pain, but ankle ~5/5, hip 3/5, knee 2/5  FIM:  FIM - Banker Devices: Walker;Arm rests Bed/Chair Transfer: 3: Supine > Sit: Mod A (lifting assist/Pt. 50-74%/lift 2 legs;3: Sit > Supine: Mod A (lifting assist/Pt. 50-74%/lift 2 legs);2: Bed > Chair or W/C: Max A (lift and lower assist);3: Chair or W/C > Bed: Mod A (lift or lower assist) FIM - Locomotion:  Wheelchair Distance: 150 Locomotion: Wheelchair: 5: Travels 150 ft or more: maneuvers on rugs and over door sills with supervision, cueing or coaxing FIM - Locomotion: Ambulation Locomotion: Ambulation Assistive Devices: Designer, industrial/product Ambulation/Gait Assistance: 4: Min assist Locomotion: Ambulation: 4: Travels 150 ft or more with minimal assistance (Pt.>75%) FIM - Locomotion: Stairs Locomotion: Stairs: 0: Activity did not occur   Refer to Care Plan for Long Term Goals  Recommendations for other services: None  Discharge Criteria: Patient will  be discharged from PT if patient refuses treatment 3 consecutive times without medical reason, if treatment goals not met, if there is a change in medical status, if patient makes no progress towards goals or if patient is discharged from hospital.  The above assessment, treatment plan, treatment alternatives and goals were discussed and mutually agreed upon: by patient  Clydene Laming, PT, DPT  12/07/2012, 12:11 PM

## 2012-12-07 NOTE — Progress Notes (Signed)
Social Work Assessment and Plan Social Work Assessment and Plan  Patient Details  Name: Nathan Ramirez MRN: [id number] Date of Birth: July 01, 1945  Today's Date: 12/07/2012  Problem List:  Patient Active Problem List   Diagnosis Date Noted  . Urinary retention 12/06/2012  . Status post total bilateral knee replacement 12/06/2012  . Hyponatremia 12/03/2012  . Sinus tachycardia by electrocardiogram 12/03/2012  . Postoperative anemia due to acute blood loss 12/02/2012  . OA (osteoarthritis) of knee 12/01/2012  . Medicare welcome visit 11/19/2012  . Other and unspecified hyperlipidemia 11/17/2012  . Preoperative evaluation to rule out surgical contraindication 10/11/2012  . Irritable bowel syndrome 10/01/2012  . GERD (gastroesophageal reflux disease) 09/19/2012  . Physical exam 06/15/2012  . Hyperlipidemia LDL goal <100 11/17/2011  . Obesity (BMI 30.0-34.9) 11/17/2011  . ALLERGIC RHINITIS CAUSE UNSPECIFIED 02/20/2010  . ASTHMA 02/20/2010   Past Medical History:  Past Medical History  Diagnosis Date  . Arthritis   . Chicken pox   . Allergy   . Elevated blood pressure reading   . GERD (gastroesophageal reflux disease)   . Hypercholesteremia     borderline  . Asthma     "allergic"  . Headache(784.0)     occasional tension  . PONV (postoperative nausea and vomiting)    Past Surgical History:  Past Surgical History  Procedure Laterality Date  . Lumbar disc surgery  2008  . Knee arthroscopy  2009& 2010    RIGHT AND LEFT KNEE  . Appendectomy  1972  . Total knee arthroplasty Bilateral 12/01/2012    Procedure: TOTAL KNEE BILATERAL;  Surgeon: Loanne Drilling, MD;  Location: [facility];  Service: Orthopedics;  Laterality: Bilateral;   Social History:  reports that he quit smoking about 24 years ago. His smoking use included Cigarettes. He smoked 0.00 packs per day for 40 years. He has never used smokeless tobacco. He reports that he drinks alcohol. He reports that he does not use  illicit drugs.  Family / Support Systems Marital Status: Married Patient Roles: Spouse;Parent;Other (Comment) (Raises horses-recently retired) Spouse/Significant Other: [phone]  [phone] Children: Two son's out of state Other Supports: Friends and extended family Anticipated Caregiver: Wife Ability/Limitations of Caregiver: Wife is limited due to her own hip, knees and shoulder replacements Caregiver Availability: 24/7 Family Dynamics: Close knit family who are there for one another.  Wife has own helath issues and can only provide supervision level.  Pt is doing well already in therapies.  Social History Preferred language: English Religion: Presbyterian Cultural Background: No issues Education: [employer] Educated Read: Yes Write: Yes Employment Status: Retired Fish farm manager Issues: No issues Guardian/Conservator: None-according to MD pt is capable of making his own decisions while here   Abuse/Neglect Physical Abuse: Denies Verbal Abuse: Denies Sexual Abuse: Denies Exploitation of patient/patient's resources: Denies Self-Neglect: Denies  Emotional Status Pt's affect, behavior adn adjustment status: Pt is motivated and pushihng himself to do well, so he can return home soon.  He feels good about his progress and can see daily progress.  Wife is concerned he may push himself too hard and exhasust himself. Recent Psychosocial Issues: Other medical issues Pyschiatric History: No history-deferred depression screen due to pt and wife feel he is doing well with this surgery.  He is positive and future oreinted and looking forward to retirement.  Patient / Family Perceptions, Expectations & Goals Pt/Family understanding of illness & functional limitations: Pt and wife have a good understanding of his condition and treatment plan.  He  is making good progress and feels he will not need to be here long.  Wife has been through this surgey before and knows what  to expect. Premorbid pt/family roles/activities: Husband, Father, Retiree, Horse trainer, Church member, etc Anticipated changes in roles/activities/participation: resume upon recovery Pt/family expectations/goals: Pt states: " I want to be able to get around on my own and get in and out of the bed that is the hardest."  Wife states: " I hope he can move around himsefl, since I can not help him.'  [employer]: None Premorbid Home Care/DME Agencies: None Transportation available at discharge: Wife  Discharge Planning Living Arrangements: Spouse/significant other Support Systems: Spouse/significant other;Children;Friends/neighbors;Other relatives;Church/faith community Type of Residence: Private residence Insurance Resources: Administrator (specify) ([employer]) Surveyor, quantity Resources: Restaurant manager, fast food Screen Referred: No Living Expenses: Lives with family Money Management: Spouse;Patient Does the patient have any problems obtaining your medications?: No Home Management: Both Patient/Family Preliminary Plans: Return home with wife who can not provide care due to her own helath issues.  Pt plans to be mod/i levle by the time he leaves here and he should reach these goals by discharge.  He has done well his first day. Social Work Anticipated Follow Up Needs: HH/OP  Clinical Impression Very pleasant couple who are supportive of one another.  Pt has done well his first day and should reach mod/i level goals.  Should be short length of stay. Meet with after team conference tomorrow.  Lucy Chris 12/07/2012, 2:33 PM

## 2012-12-07 NOTE — Progress Notes (Signed)
Patient information reviewed and entered into eRehab system by Gearald Stonebraker, RN, CRRN, PPS Coordinator.  Information including medical coding and functional independence measure will be reviewed and updated through discharge.     Per nursing patient was given "Data Collection Information Summary for Patients in Inpatient Rehabilitation Facilities with attached "Privacy Act Statement-Health Care Records" upon admission.  

## 2012-12-07 NOTE — Progress Notes (Signed)
ANTICOAGULATION CONSULT NOTE - Follow Up Consult  Pharmacy Consult for Warfarin Indication: Bilateral TKA  No Known Allergies  Patient Measurements: Height: 5\' 9"  (175.3 cm) Weight: 217 lb (98.431 kg) (from 11/24/12 preadmit visit) IBW/kg (Calculated) : 70.7  Vital Signs: Temp: 98.8 F (37.1 C) (11/25 0526) Temp src: Oral (11/25 0526) BP: 105/68 mmHg (11/25 0526) Pulse Rate: 102 (11/25 0526)  Labs:  Recent Labs  12/05/12 0448 12/06/12 0403 12/07/12 0715  HGB  --   --  8.6*  HCT  --   --  24.8*  PLT  --   --  295  LABPROT 20.1* 20.1* 24.9*  INR 1.77* 1.77* 2.34*  CREATININE  --   --  1.00    Estimated Creatinine Clearance: 85.2 ml/min (by C-G formula based on Cr of 1).   Medications:  Scheduled:  . bisacodyl  10 mg Rectal Once  . dicyclomine  10 mg Oral TID AC & HS  . docusate sodium  100 mg Oral BID  . enoxaparin (LOVENOX) injection  30 mg Subcutaneous Q12H  . lansoprazole  15 mg Oral Q1200  . mometasone-formoterol  2 puff Inhalation BID  . Warfarin - Pharmacist Dosing Inpatient   Does not apply q1800   IAssessment: 67 yo s/p bilateral TKA on 11/20, warfarin started for post-op VTE prophylaxis. INR=2.34, now at goal.  No bleeding noted.   Goal of Therapy:  INR 2-3   Plan: Warfarin 2.5mg  po x 1 dose today.  Daily PT/INR D/C Lovenox  Wendie Simmer, PharmD, BCPS Clinical Pharmacist  Pager: [phone]

## 2012-12-08 ENCOUNTER — Encounter (HOSPITAL_COMMUNITY): Payer: Self-pay | Admitting: Physician Assistant

## 2012-12-08 ENCOUNTER — Inpatient Hospital Stay (HOSPITAL_COMMUNITY): Payer: Medicare Other

## 2012-12-08 ENCOUNTER — Inpatient Hospital Stay (HOSPITAL_COMMUNITY): Payer: Medicare Other | Admitting: Occupational Therapy

## 2012-12-08 ENCOUNTER — Inpatient Hospital Stay (HOSPITAL_COMMUNITY): Payer: Medicare Other | Admitting: Physical Therapy

## 2012-12-08 DIAGNOSIS — R7989 Other specified abnormal findings of blood chemistry: Secondary | ICD-10-CM

## 2012-12-08 DIAGNOSIS — R945 Abnormal results of liver function studies: Secondary | ICD-10-CM

## 2012-12-08 LAB — COMPREHENSIVE METABOLIC PANEL
ALT: 196 U/L — ABNORMAL HIGH (ref 0–53)
AST: 187 U/L — ABNORMAL HIGH (ref 0–37)
Albumin: 2.5 g/dL — ABNORMAL LOW (ref 3.5–5.2)
Alkaline Phosphatase: 214 U/L — ABNORMAL HIGH (ref 39–117)
BUN: 14 mg/dL (ref 6–23)
CO2: 25 mEq/L (ref 19–32)
Calcium: 8.7 mg/dL (ref 8.4–10.5)
Chloride: 96 mEq/L (ref 96–112)
Creatinine, Ser: 0.95 mg/dL (ref 0.50–1.35)
GFR calc Af Amer: >90 mL/min (ref >90)
GFR calc non Af Amer: 85 mL/min — ABNORMAL LOW (ref >90)
Glucose, Bld: 113 mg/dL — ABNORMAL HIGH (ref 70–99)
Potassium: 3.7 mEq/L (ref 3.5–5.1)
Sodium: 133 mEq/L — ABNORMAL LOW (ref 135–145)
Total Bilirubin: 1.1 mg/dL (ref 0.3–1.2)
Total Protein: 6 g/dL (ref 6.0–8.3)

## 2012-12-08 LAB — PROTIME-INR
INR: 2.1 — ABNORMAL HIGH (ref 0.00–1.49)
Prothrombin Time: 22.9 seconds — ABNORMAL HIGH (ref 11.6–15.2)

## 2012-12-08 LAB — HEPATITIS PANEL, ACUTE
HCV Ab: NEGATIVE
Hep A IgM: NONREACTIVE
Hep B C IgM: NONREACTIVE
Hepatitis B Surface Ag: NEGATIVE

## 2012-12-08 MED ORDER — WARFARIN SODIUM 4 MG PO TABS
4.0000 mg | ORAL_TABLET | Freq: Once | ORAL | Status: AC
  Administered 2012-12-09: 4 mg via ORAL
  Filled 2012-12-08: qty 1

## 2012-12-08 NOTE — Progress Notes (Signed)
ANTICOAGULATION CONSULT NOTE - Follow Up Consult  Pharmacy Consult for Warfarin Indication: Bilateral TKA  No Known Allergies  Patient Measurements: Height: 5\' 9"  (175.3 cm) Weight: 229 lb 4.8 oz (104.01 kg) IBW/kg (Calculated) : 70.7  Vital Signs: Temp: 98.9 F (37.2 C) (11/26 0555) Temp src: Oral (11/26 0555) BP: 110/63 mmHg (11/26 0555) Pulse Rate: 97 (11/26 0555)  Labs:  Recent Labs  12/06/12 0403 12/07/12 0715 12/08/12 0610  HGB  --  8.6*  --   HCT  --  24.8*  --   PLT  --  295  --   LABPROT 20.1* 24.9* 22.9*  INR 1.77* 2.34* 2.10*  CREATININE  --  1.00 0.95    Estimated Creatinine Clearance: 92.1 ml/min (by C-G formula based on Cr of 0.95).   Medications:  Scheduled:  . bisacodyl  10 mg Rectal Once  . dicyclomine  10 mg Oral TID AC & HS  . docusate sodium  100 mg Oral BID  . lansoprazole  15 mg Oral Q1200  . mometasone-formoterol  2 puff Inhalation BID  . Warfarin - Pharmacist Dosing Inpatient   Does not apply q1800   IAssessment: 67 yo s/p bilateral TKA on 11/20, warfarin started for post-op VTE prophylaxis. INR=2.1, at goal.  No bleeding noted.   Goal of Therapy:  INR 2-3   Plan: Warfarin 4 mg po x 1 dose today.  Daily PT/INR  Bayard Hugger, PharmD, BCPS  Clinical Pharmacist  Pager: [phone]

## 2012-12-08 NOTE — Consult Note (Signed)
[facility] Consult: 2:42 PM 12/08/2012  LOS: 2 days    Referring Provider: Dr Wynn Banker  Primary Care Physician:  Duncan Dull, MD Primary Gastroenterologist:  In [place].  Unassigned in [place]    Reason for Consultation:  Rising LFTs   HPI: Nathan Ramirez is a 67 y.o. male.  On rehab after 12/01/12 Bilateral knee replacements by Dr Berton Lan.  On rehab LFTs are rising. Reviewed LFTs back to early Sept 2014 and AST ALT in the mid 30s, so minimally elevated, alk phos in the 40s to low 50s, normal T bili.   As of 11/25 alk phos 166, AST/ALT 119/127. Today           "    "       214   "   /   "    187/196.  T bili is not elevated.   Ultrasound of abdomen ordered but will not occur today as he ate lunch.  The meds he is taking are chronic from PTA, Tylenol stopped 11/25. Oxycodone is not mixed with APAP  Pt has never been aware of abnormal LFTs in past.  Never had ultrasound.  At home drinks ~ 2 beers per day.  Not having nausea or abdominal pain.  Appetite decreased and less frequent stools he attributes to Oxycodone which he still requires for improving but considerable knee pain.     Past Medical History  Diagnosis Date  . Arthritis   . Chicken pox   . Allergy   . Elevated blood pressure reading   . GERD (gastroesophageal reflux disease)     never had EGD  . Hypercholesteremia     borderline  . Asthma     "allergic"  . Headache(784.0)     occasional tension  . PONV (postoperative nausea and vomiting)   . Colon polyp ~ 2012    pathology unknown, colonoscopy at [facility] in [place].     Past Surgical History  Procedure Laterality Date  . Lumbar disc surgery  2008  . Knee arthroscopy  2009& 2010    RIGHT AND LEFT KNEE  . Appendectomy  1972  . Total knee arthroplasty Bilateral  12/01/2012    Procedure: TOTAL KNEE BILATERAL;  Surgeon: Loanne Drilling, MD;  Location: [facility];  Service: Orthopedics;  Laterality: Bilateral;    Prior to Admission medications   Medication Sig Start Date End Date Taking? Authorizing Provider  acetaminophen (TYLENOL) 325 MG tablet Take 2 tablets (650 mg total) by mouth every 6 (six) hours as needed for mild pain (or Fever >/= 101). 12/06/12  Yes Alexzandrew Julien Girt, PA-C  diazepam (VALIUM) 5 MG tablet Take 1 tablet (5 mg total) by mouth every 6 (six) hours as needed for anxiety. 12/06/12  Yes Alexzandrew Julien Girt, PA-C  dicyclomine (BENTYL) 10 MG capsule Take 10 mg by mouth 4 (four) times daily -  with meals and at bedtime.   Yes Historical Provider, MD  dicyclomine (BENTYL) 10 MG capsule TAKE ONE CAPSULE BY MOUTH 4 TIMES DAILY BEFORE MEAL(S) AT BEDTIME 11/29/12  Yes  Sherlene Shams, MD  docusate sodium 100 MG CAPS Take 100 mg by mouth 2 (two) times daily. 12/06/12  Yes Alexzandrew Julien Girt, PA-C  enoxaparin (LOVENOX) 30 MG/0.3ML injection Inject 0.3 mLs (30 mg total) into the skin every 12 (twelve) hours. Continue Lovenox injections until the INR is therapeutic at or greater than 2.0.  When INR reaches the therapeutic level of equal to or greater than 2.0, the patient may discontinue the Lovenox injections. 12/06/12  Yes Alexzandrew Julien Girt, PA-C  Fluticasone-Salmeterol (ADVAIR) 100-50 MCG/DOSE AEPB Inhale 1 puff into the lungs 2 (two) times daily.   Yes Historical Provider, MD  lansoprazole (PREVACID) 15 MG capsule Take 15 mg by mouth daily at 12 noon.   Yes Historical Provider, MD  methocarbamol (ROBAXIN) 500 MG tablet Take 1 tablet (500 mg total) by mouth every 6 (six) hours as needed for muscle spasms. 12/06/12  Yes Alexzandrew Julien Girt, PA-C  ondansetron (ZOFRAN) 4 MG tablet Take 1 tablet (4 mg total) by mouth every 6 (six) hours as needed for nausea. 12/06/12  Yes Alexzandrew Julien Girt, PA-C  oxyCODONE (OXY IR/ROXICODONE) 5 MG immediate release  tablet Take 1-4 tablets (5-20 mg total) by mouth every 3 (three) hours as needed for moderate pain or severe pain. 12/06/12  Yes Alexzandrew Julien Girt, PA-C  polyethylene glycol (MIRALAX / GLYCOLAX) packet Take 17 g by mouth daily as needed for moderate constipation. 12/06/12  Yes Alexzandrew Julien Girt, PA-C  sodium phosphate (FLEET) 7-19 GM/118ML ENEM Place 1 enema rectally daily as needed for severe constipation. 12/06/12  Yes Alexzandrew Julien Girt, PA-C  traMADol (ULTRAM) 50 MG tablet Take 2 tablets (100 mg total) by mouth every 8 (eight) hours as needed (mild pain). 12/06/12  Yes Alexzandrew Julien Girt, PA-C  warfarin (COUMADIN) 4 MG tablet Take 1 tablet (4 mg total) by mouth one time only at 6 PM. Take Coumadin for four weeks and then discontinue.  The dose may need to be adjusted based upon the INR.  Please follow the INR and titrate Coumadin dose for a therapeutic range between 2.0 and 3.0 INR.  After completing the four weeks of Coumadin, the patient may stop the Coumadin and resume their 81 mg Aspirin daily. 12/06/12  Yes Alexzandrew Julien Girt, PA-C    Scheduled Meds: . bisacodyl  10 mg Rectal Once  . dicyclomine  10 mg Oral TID AC & HS  . docusate sodium  100 mg Oral BID  . lansoprazole  15 mg Oral Q1200  . mometasone-formoterol  2 puff Inhalation BID  . warfarin  4 mg Oral ONCE-1800  . Warfarin - Pharmacist Dosing Inpatient   Does not apply q1800   Infusions:   PRN Meds: alum & mag hydroxide-simeth, bisacodyl, methocarbamol, ondansetron (ZOFRAN) IV, ondansetron, oxyCODONE, polyethylene glycol   Allergies as of 12/06/2012  . (No Known Allergies)    Family History No GB or pancreas  Or liver disease.   History   Social History  . Marital Status: Married    Spouse Name: N/A    Number of Children: N/A  . Years of Education: N/A   Occupational History  . Not on file.   Social History Main Topics  . Smoking status: Former Smoker -- 40 years    Types: Cigarettes    Quit date:  01/14/1988  . Smokeless tobacco: Never Used  . Alcohol Use: Yes     Comment: OCCASSIONALLY  . Drug Use: No  . Sexual Activity: Not on file   Other Topics Concern  . Not on file  Social History Narrative  . No narrative on file    REVIEW OF SYSTEMS: Constitutional:  Weight stable  ENT:  No nose bleeds.  No sore throat Pulm:  No cough or dyspnea.  No orthopnea CV:  No chest pain or palpitations, no edema GU:  No dysuria, no dark urine GI:  Per HPI Heme:  No anemia .    Transfusions:  none Neuro:  No headache, no dizziness, no assymetric weakness Derm:  No rash, no sores, no jaundice Endocrine:  No hx diabetes, no sweats, no excessive thirst or urination Immunization:  Flu and pneumovax, Tdap 09/2011.  Travel:  none   PHYSICAL EXAM: Vital signs in last 24 hours: Filed Vitals:   12/08/12 0555  BP: 110/63  Pulse: 97  Temp: 98.9 F (37.2 C)  Resp: 16   Wt Readings from Last 3 Encounters:  12/07/12 104.01 kg (229 lb 4.8 oz)  12/01/12 97.07 kg (214 lb)  12/01/12 97.07 kg (214 lb)   General: looks well.  comfortable Head:  No swelling or trauma  Eyes:  No icterus, EOMI, no pallor Ears:  Not HOH  Nose:  No discharge Mouth:  Clear, moist, some missing molars but remaining teeth in good repair Neck:  No JVD, no mass, no TMG Lungs:  Clear bil.  No dyspnea or cough Heart: RRR.  No MRG Abdomen:  Soft, obese/protuberant.  No tenderness, bruising visible and palpable where he's had sub Q heparin.   Rectal: deferred   Musc/Skeltl: bil TKR scars, some bruising and swelling Extremities:  No pedal edema  Neurologic:  Pleasant, no asterixis or tremor, oriented x 3.  No gross deficits.  Good historian. Skin:  No rash, no sores, no telangectasia Tattoos:  none Nodes:  No cervical adenopathy   Psych:  Cooperative, relaxed, not depressed.   Intake/Output from previous day: 11/25 0701 - 11/26 0700 In: 720 [P.O.:720] Out: 1850 [Urine:1850] Intake/Output this shift: Total  I/O In: 480 [P.O.:480] Out: -   LAB RESULTS:  Recent Labs  12/07/12 0715  WBC 11.7*  HGB 8.6*  HCT 24.8*  PLT 295   BMET Lab Results  Component Value Date   NA 133* 12/08/2012   NA 131* 12/07/2012   NA 133* 12/03/2012   K 3.7 12/08/2012   K 3.8 12/07/2012   K 4.3 12/03/2012   CL 96 12/08/2012   CL 95* 12/07/2012   CL 101 12/03/2012   CO2 25 12/08/2012   CO2 26 12/07/2012   CO2 24 12/03/2012   GLUCOSE 113* 12/08/2012   GLUCOSE 110* 12/07/2012   GLUCOSE 160* 12/03/2012   BUN 14 12/08/2012   BUN 15 12/07/2012   BUN 16 12/03/2012   CREATININE 0.95 12/08/2012   CREATININE 1.00 12/07/2012   CREATININE 1.13 12/03/2012   CALCIUM 8.7 12/08/2012   CALCIUM 8.5 12/07/2012   CALCIUM 9.0 12/03/2012   LFT  Recent Labs  12/07/12 0715 12/08/12 0610  PROT 6.0 6.0  ALBUMIN 2.5* 2.5*  AST 119* 187*  ALT 127* 196*  ALKPHOS 166* 214*  BILITOT 0.9 1.1   PT/INR Lab Results  Component Value Date   INR 2.10* 12/08/2012   INR 2.34* 12/07/2012   INR 1.77* 12/06/2012   Hepatitis Panel No results found for this basename: HEPBSAG, HCVAB, HEPAIGM, HEPBIGM,  in the last 72 hours  Lipase  No results found for this basename: lipase     RADIOLOGY STUDIES: No results found.  ENDOSCOPIC STUDIES: Colonoscopy in [place]  IMPRESSION:   *  Rising transamninases and alk phos.  No sxs of GB or liver disease. No hx of liver test abnormalities.   *  11/19 bil TKR.    *  Hx GERD, sxs controlled with Lansoprazole.  No previous EGD.   PLAN:     *  Ultrasound abdomen.  Tech will be able to complete tonight.  NPO for this but can resme regular diet afterwards.  *  Acute hepatitis panel.    Jennye Moccasin  12/08/2012, 2:42 PM Pager: [phone]      Attending physician's note   I have taken a history, examined the patient and reviewed the chart. I agree with the Advanced Practitioner's note, impression and recommendations. Elevated transaminases and alk phos. R/O biliary  disease, hepatic lesions, med induced liver disease and other underlying liver diseases. Would trend LFTs every few day in hospital. Abd [place] ordered. He will need further evaluation and follow up on this problem as an outpatient with his Gastroenterologist in [place].   Meryl Dare, MD Clementeen Graham

## 2012-12-08 NOTE — Progress Notes (Signed)
Social Work Patient ID: Nathan Ramirez, male   DOB: 10-06-1945, 67 y.o.   MRN: [id number] Met with pt to inform him of team conference goals-mod/i level and discharge 12/1.  He is aware of the need for a rail into his home. He is pleased with his progress and feels he will be ready then.  He will talk with son regarding the rail.

## 2012-12-08 NOTE — Progress Notes (Signed)
Physical Therapy Note  Patient Details  Name: Nathan Ramirez MRN: [id number] Date of Birth: [birth year] Today's Date: 12/08/2012  1400 Pt missed 30 minute PT session secondary to MD consultation.    Mkayla Steele,JIM 12/08/2012, 2:19 PM

## 2012-12-08 NOTE — Progress Notes (Signed)
Occupational Therapy Session Note  Patient Details  Name: Nathan Ramirez MRN: [id number] Date of Birth: 02-12-1945  Today's Date: 12/08/2012 Time:  910- 1010  Time Calculation (min): 60 min  Short Term Goals: No short term goals set  Skilled Therapeutic Interventions/Progress Updates:    Pt seen for BADL retraining of B/D at sink level with a focus on sit to stand, standing balance, and use of AE.  Pt demonstrated a great deal of progress with his functional mobility today as he was able to stand from chair with supervision.  He was also able to stand and pull pants over his hips. He used Sports administrator to [employer] and don pants over feet.  Pt then practiced ambulating to and from the toilet with elevated seat with RW with close supervision.  Pt then went to gym to work on UE strength and endurance with UBE for 15 min total (5 min work, 1 min rest 3x).  Pt returned to room at end of session.  Therapy Documentation Precautions:  Precautions Precautions: Knee;Fall Precaution Comments: Needs L KI for walking< R KI discontionued due to 10 SLR wtih <10 deg lag Required Braces or Orthoses: Knee Immobilizer - Left Knee Immobilizer - Right: Discontinue once straight leg raise with < 10 degree lag Knee Immobilizer - Left: Discontinue once straight leg raise with < 10 degree lag Restrictions Weight Bearing Restrictions: No Other Position/Activity Restrictions: WBAT  Pain: Pain Assessment Pain Assessment: 0-10 Pain Score: 6  Pain Type: Surgical pain Pain Location: Knee Pain Orientation: Right;Left Pain Descriptors / Indicators: Aching Pain Intervention(s): Cold applied ADL:  See FIM for current functional status  Therapy/Group: Individual Therapy  SAGUIER,JULIA 12/08/2012, 11:36 AM

## 2012-12-08 NOTE — Progress Notes (Signed)
Subjective/Complaints: Tolerating therapy, pain control is better  Objective: Vital Signs: Blood pressure 110/63, pulse 97, temperature 98.9 F (37.2 C), temperature source Oral, resp. rate 16, height 5\' 9"  (1.753 m), weight 104.01 kg (229 lb 4.8 oz), SpO2 97.00%. No results found. Results for orders placed during the hospital encounter of 12/06/12 (from the past 72 hour(s))  CBC WITH DIFFERENTIAL     Status: Abnormal   Collection Time    12/07/12  7:15 AM      Result Value Range   WBC 11.7 (*) 4.0 - 10.5 K/uL   Comment: WHITE COUNT CONFIRMED ON SMEAR   RBC 2.68 (*) 4.22 - 5.81 MIL/uL   Hemoglobin 8.6 (*) 13.0 - 17.0 g/dL   HCT 16.1 (*) 09.6 - 04.5 %   MCV 92.5  78.0 - 100.0 fL   MCH 32.1  26.0 - 34.0 pg   MCHC 34.7  30.0 - 36.0 g/dL   RDW 40.9  81.1 - 91.4 %   Platelets 295  150 - 400 K/uL   Comment: PLATELET COUNT CONFIRMED BY SMEAR   Neutrophils Relative % 65  43 - 77 %   Lymphocytes Relative 16  12 - 46 %   Monocytes Relative 16 (*) 3 - 12 %   Eosinophils Relative 3  0 - 5 %   Basophils Relative 0  0 - 1 %   Neutro Abs 7.5  1.7 - 7.7 K/uL   Lymphs Abs 1.9  0.7 - 4.0 K/uL   Monocytes Absolute 1.9 (*) 0.1 - 1.0 K/uL   Eosinophils Absolute 0.4  0.0 - 0.7 K/uL   Basophils Absolute 0.0  0.0 - 0.1 K/uL   RBC Morphology POLYCHROMASIA PRESENT    COMPREHENSIVE METABOLIC PANEL     Status: Abnormal   Collection Time    12/07/12  7:15 AM      Result Value Range   Sodium 131 (*) 135 - 145 mEq/L   Potassium 3.8  3.5 - 5.1 mEq/L   Chloride 95 (*) 96 - 112 mEq/L   CO2 26  19 - 32 mEq/L   Glucose, Bld 110 (*) 70 - 99 mg/dL   BUN 15  6 - 23 mg/dL   Creatinine, Ser 7.82  0.50 - 1.35 mg/dL   Calcium 8.5  8.4 - 95.6 mg/dL   Total Protein 6.0  6.0 - 8.3 g/dL   Albumin 2.5 (*) 3.5 - 5.2 g/dL   AST 213 (*) 0 - 37 U/L   ALT 127 (*) 0 - 53 U/L   Alkaline Phosphatase 166 (*) 39 - 117 U/L   Total Bilirubin 0.9  0.3 - 1.2 mg/dL   GFR calc non Af Amer 77 (*) >90 mL/min   GFR calc Af Amer  89 (*) >90 mL/min   Comment: (NOTE)     The eGFR has been calculated using the CKD EPI equation.     This calculation has not been validated in all clinical situations.     eGFR's persistently <90 mL/min signify possible Chronic Kidney     Disease.  PROTIME-INR     Status: Abnormal   Collection Time    12/07/12  7:15 AM      Result Value Range   Prothrombin Time 24.9 (*) 11.6 - 15.2 seconds   INR 2.34 (*) 0.00 - 1.49  PROTIME-INR     Status: Abnormal   Collection Time    12/08/12  6:10 AM      Result Value Range   Prothrombin  Time 22.9 (*) 11.6 - 15.2 seconds   INR 2.10 (*) 0.00 - 1.49  COMPREHENSIVE METABOLIC PANEL     Status: Abnormal   Collection Time    12/08/12  6:10 AM      Result Value Range   Sodium 133 (*) 135 - 145 mEq/L   Potassium 3.7  3.5 - 5.1 mEq/L   Chloride 96  96 - 112 mEq/L   CO2 25  19 - 32 mEq/L   Glucose, Bld 113 (*) 70 - 99 mg/dL   BUN 14  6 - 23 mg/dL   Creatinine, Ser 1.61  0.50 - 1.35 mg/dL   Calcium 8.7  8.4 - 09.6 mg/dL   Total Protein 6.0  6.0 - 8.3 g/dL   Albumin 2.5 (*) 3.5 - 5.2 g/dL   AST 045 (*) 0 - 37 U/L   ALT 196 (*) 0 - 53 U/L   Alkaline Phosphatase 214 (*) 39 - 117 U/L   Total Bilirubin 1.1  0.3 - 1.2 mg/dL   GFR calc non Af Amer 85 (*) >90 mL/min   GFR calc Af Amer >90  >90 mL/min   Comment: (NOTE)     The eGFR has been calculated using the CKD EPI equation.     This calculation has not been validated in all clinical situations.     eGFR's persistently <90 mL/min signify possible Chronic Kidney     Disease.        Assessment/Plan: 1. 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 Physiatrist is providing close team supervision and 24 hour management of active medical problems listed below. Physiatrist and rehab team continue to assess barriers to discharge/monitor patient progress toward  functional and medical goals. FIM: FIM - Bathing Bathing Steps Patient Completed: Chest;Right Arm;Left Arm;Abdomen;Left upper leg;Right upper leg;Buttocks;Front perineal area Bathing: 4: Min-Patient completes 8-9 47f 10 parts or 75+ percent  FIM - Upper Body Dressing/Undressing Upper body dressing/undressing steps patient completed: Thread/unthread right sleeve of pullover shirt/dresss;Thread/unthread left sleeve of pullover shirt/dress;Put head through opening of pull over shirt/dress;Pull shirt over trunk Upper body dressing/undressing: 5: Set-up assist to: Obtain clothing/put away FIM - Lower Body Dressing/Undressing Lower body dressing/undressing steps patient completed: Thread/unthread right underwear leg;Thread/unthread left underwear leg;Pull underwear up/down;Thread/unthread right pants leg;Thread/unthread left pants leg;Pull pants up/down (used reacher) Lower body dressing/undressing: 3: Mod-Patient completed 50-74% of tasks  FIM - Toileting Toileting: 0: Activity did not occur  FIM - Diplomatic Services operational officer Devices: Elevated toilet seat;Walker Toilet Transfers: 5-To toilet/BSC: Supervision (verbal cues/safety issues);5-From toilet/BSC: Supervision (verbal cues/safety issues)  FIM - Banker Devices: Walker;Arm rests Bed/Chair Transfer: 4: Supine > Sit: Min A (steadying Pt. > 75%/lift 1 leg);4: Bed > Chair or W/C: Min A (steadying Pt. > 75%);3: Chair or W/C > Bed: Mod A (lift or lower assist)  FIM - Locomotion: Wheelchair Distance: 150 Locomotion: Wheelchair: 1: Total Assistance/staff pushes wheelchair (Pt<25%) FIM - Locomotion: Ambulation Locomotion: Ambulation Assistive Devices: Designer, industrial/product Ambulation/Gait Assistance: 4: Min assist Locomotion: Ambulation: 4: Travels 150 ft or more with minimal assistance (Pt.>75%)  Comprehension Comprehension Mode: Auditory Comprehension: 7-Follows complex  conversation/direction: With no assist  Expression Expression Mode: Verbal Expression: 7-Expresses complex ideas: With no assist  Social Interaction Social Interaction: 7-Interacts appropriately with others - No medications needed.  Problem Solving Problem Solving: 6-Solves complex problems: With extra time  Memory Memory: 7-Complete Independence: No helper  Medical Problem List and Plan:  1. 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 DVT Prophylaxis/Anticoagulation: Coumadin for DVT prophylaxis. Continue Lovenox for INR greater than 2.00. Check vascular study  3. Pain Management: Oxycodone and Robaxin as needed. Monitor with increased mobility  4. Neuropsych: This patient is capable of making decisions on his own behalf.  5. Acute blood loss anemia. Followup CBC  6. GERD. Prevacid  7. Asthma.Dulera 2 puffs twice a day. No complaints of shortness of breath  8. Irritable bowel syndrome. Bentyl 10 mg 3 times a day.Will hold and give dulc supp  9.  Elevated LFT  D/c ed  Acetaminophen but still going up.  No obvious meds, will ask GI to eval    LOS (Days) 2 A FACE TO FACE EVALUATION WAS PERFORMED  Tierany Appleby E 12/08/2012, 1:35 PM

## 2012-12-08 NOTE — Patient Care Conference (Signed)
Inpatient RehabilitationTeam Conference and Plan of Care Update Date: 12/08/2012   Time: 11:00 AM    Patient Name: Nathan Ramirez      Medical Record Number: [id number]  Date of Birth: [birth year] Sex: Male         Room/Bed: 4M07C/4M07C-01 Payor Info: Payor: MEDICARE / Plan: MEDICARE PART A AND B / Product Type: *No Product type* /    Admitting Diagnosis: B TKR  Admit Date/Time:  12/06/2012 12:57 PM Admission Comments: No comment available   Primary Diagnosis:  Status post total bilateral knee replacement Principal Problem: Status post total bilateral knee replacement  Patient Active Problem List   Diagnosis Date Noted  . Urinary retention 12/06/2012  . Status post total bilateral knee replacement 12/06/2012  . Hyponatremia 12/03/2012  . Sinus tachycardia by electrocardiogram 12/03/2012  . Postoperative anemia due to acute blood loss 12/02/2012  . OA (osteoarthritis) of knee 12/01/2012  . Medicare welcome visit 11/19/2012  . Other and unspecified hyperlipidemia 11/17/2012  . Preoperative evaluation to rule out surgical contraindication 10/11/2012  . Irritable bowel syndrome 10/01/2012  . GERD (gastroesophageal reflux disease) 09/19/2012  . Physical exam 06/15/2012  . Hyperlipidemia LDL goal <100 11/17/2011  . Obesity (BMI 30.0-34.9) 11/17/2011  . ALLERGIC RHINITIS CAUSE UNSPECIFIED 02/20/2010  . ASTHMA 02/20/2010    Expected Discharge Date: Expected Discharge Date: 12/13/12  Team Members Present: Physician leading conference: Dr. Claudette Laws Social Worker Present: Dossie Der, LCSW;Jenny Prevatt, LCSW Nurse Present: Other (comment) Doree Fudge Rosero-RN) PT Present: Wanda Plump, PT OT Present: Bretta Bang, OT SLP Present: Maxcine Ham, SLP PPS Coordinator present : Tora Duck, RN, CRRN     Current Status/Progress Goal Weekly Team Focus  Medical   Pain under better control, elevated liver function tests no abdominal pain  Maintained good pain control as well as  medical stability  GI consult   Bowel/Bladder   Continent of bowel and bladder.LBM 11/2012  Remain continent of bowel and bladder.  Monitor any s/s of diarrhea and constipation.   Swallow/Nutrition/ Hydration     WFL        ADL's   total assist LB dressing , min assist bathing, mod assist transfers  mod I with BADLs and transfers  ADL and AE training, pt education, functional mobility and UE strengthening exercises   Mobility   Mod A bed mobility and transfers, Min A gait wtih RW x150.' ROM grossly -10 deg ext to 80 deg flexion bilaterally   Mod I gait and transfers, Supervision stairs and car  Bil ROM, strength, increasing indep with mobility, gait with RW, and stairs   Communication     [facility]        Safety/Cognition/ Behavioral Observations    No concerns        Pain   c/o to bilateral knees.PRN  Oxycodone IR  20 mg given plus Robaxin 500 mg for muscle spasm with good relief noted.  Pain level 3 or less on a scale of 0-10  Monitor any onset of pain and assess effectiveness of PRN meds.   Skin   Incision to bilateral knees with steri-strips. OTA  No new skin breakdown or infection.  Assess skin q shift.      *See Care Plan and progress notes for long and short-term goals.  Barriers to Discharge: Still requires assist for mobility particularly with steps, and needs evaluation for elevated liver function tests    Possible Resolutions to Barriers:  See above    Discharge Planning/Teaching Needs:  HOme with wife who can provide supervision level only      Team Discussion:  Doing well-needs rail for steps.  Pain limits his progress-still managing balance with dosage.  Goals mod/i level  Revisions to Treatment Plan:  Upgraded goals to mod/i level   Continued Need for Acute Rehabilitation Level of Care: The patient requires daily medical management by a physician with specialized training in physical medicine and rehabilitation for the following conditions: Daily direction of a  multidisciplinary physical rehabilitation program to ensure safe treatment while eliciting the highest outcome that is of practical value to the patient.: Yes Daily medical management of patient stability for increased activity during participation in an intensive rehabilitation regime.: Yes Daily analysis of laboratory values and/or radiology reports with any subsequent need for medication adjustment of medical intervention for : Post surgical problems;Other  Kirstin Kugler, Lemar Livings 12/08/2012, 2:23 PM

## 2012-12-08 NOTE — Progress Notes (Signed)
Physical Therapy Note  Patient Details  Name: ODIE EDMONDS MRN: [id number] Date of Birth: 12/02/1945 Today's Date: 12/08/2012  0800-0850 50 min individual therapy 1300-1400  60 min individual therapy  Tx 1  Pain bil knees rated 6/10; medicated during tx; RN aware  Therapeutic exercise performed with LE to increase strength for functional mobility: supine in bed: 10 x 1 each L and R, assisted heel slides, quad sets for full ext, hip abduction, bil hip IR, terminal knee ext.  Supine> sit using railing, HOB raised, supervision.  Donned L KI with assist. Sit> stand with min assist.  Gait training , L KI, x 150' x 2 with supervision, focusing on = step lengths, fluidity, upright trunk and forward gaze.  Up/down 5 steps with 2 rails, supervision/min assist, VCs for technique and sequence.  Tx 2:  Pain rated 6/10, premedicated  Therapeutic exercise performed with LEs to increase strength for functional mobility, as above prior to getting OOB.  Wife present; she observed car transfer with supervision, VCs, siimulating back seat, entering on driver's side.  AROM bil knee flexion in sitting: L and R knee flexion 95 degrees.  Gait x 100' x 2 with supervision, L KI , with improved speed and fluidity vs this morning.  Up/down 4 steps, R rail ascending, with min assist, 1 VC for technique.  Caylah Plouff 12/08/2012, 7:57 AM

## 2012-12-08 NOTE — Progress Notes (Signed)
Social Work Lucy Chris, LCSW Social Worker Signed  Patient Care Conference Service date: 12/08/2012 2:23 PM  Inpatient RehabilitationTeam Conference and Plan of Care Update Date: 12/08/2012   Time: 11:00 AM     Patient Name: Nathan Ramirez       Medical Record Number: [id number]   Date of Birth: 07-12-1945 Sex: Male         Room/Bed: 4M07C/4M07C-01 Payor Info: Payor: MEDICARE / Plan: MEDICARE PART A AND B / Product Type: *No Product type* /   Admitting Diagnosis: B TKR   Admit Date/Time:  12/06/2012 12:57 PM Admission Comments: No comment available   Primary Diagnosis:  Status post total bilateral knee replacement Principal Problem: Status post total bilateral knee replacement    Patient Active Problem List     Diagnosis  Date Noted   .  Urinary retention  12/06/2012   .  Status post total bilateral knee replacement  12/06/2012   .  Hyponatremia  12/03/2012   .  Sinus tachycardia by electrocardiogram  12/03/2012   .  Postoperative anemia due to acute blood loss  12/02/2012   .  OA (osteoarthritis) of knee  12/01/2012   .  Medicare welcome visit  11/19/2012   .  Other and unspecified hyperlipidemia  11/17/2012   .  Preoperative evaluation to rule out surgical contraindication  10/11/2012   .  Irritable bowel syndrome  10/01/2012   .  GERD (gastroesophageal reflux disease)  09/19/2012   .  Physical exam  06/15/2012   .  Hyperlipidemia LDL goal <100  11/17/2011   .  Obesity (BMI 30.0-34.9)  11/17/2011   .  ALLERGIC RHINITIS CAUSE UNSPECIFIED  02/20/2010   .  ASTHMA  02/20/2010     Expected Discharge Date: Expected Discharge Date: 12/13/12  Team Members Present: Physician leading conference: Dr. Claudette Laws Social Worker Present: Dossie Der, LCSW;Jenny Prevatt, LCSW Nurse Present: Other (comment) Doree Fudge Rosero-RN) PT Present: Wanda Plump, PT OT Present: Bretta Bang, OT SLP Present: Maxcine Ham, SLP PPS Coordinator present : Tora Duck, RN, CRRN     Current Status/Progress  Goal  Weekly Team Focus   Medical     Pain under better control, elevated liver function tests no abdominal pain  Maintained good pain control as well as medical stability  GI consult   Bowel/Bladder     Continent of bowel and bladder.LBM 11/2012  Remain continent of bowel and bladder.  Monitor any s/s of diarrhea and constipation.   Swallow/Nutrition/ Hydration     WFL       ADL's     total assist LB dressing , min assist bathing, mod assist transfers  mod I with BADLs and transfers  ADL and AE training, pt education, functional mobility and UE strengthening exercises   Mobility     Mod A bed mobility and transfers, Min A gait wtih RW x150.' ROM grossly -10 deg ext to 80 deg flexion bilaterally   Mod I gait and transfers, Supervision stairs and car  Bil ROM, strength, increasing indep with mobility, gait with RW, and stairs   Communication     [facility]       Safety/Cognition/ Behavioral Observations    No concerns       Pain     c/o to bilateral knees.PRN  Oxycodone IR  20 mg given plus Robaxin 500 mg for muscle spasm with good relief noted.  Pain level 3 or less on a scale of 0-10  Monitor any onset  of pain and assess effectiveness of PRN meds.   Skin     Incision to bilateral knees with steri-strips. OTA  No new skin breakdown or infection.  Assess skin q shift.     *See Care Plan and progress notes for long and short-term goals.    Barriers to Discharge:  Still requires assist for mobility particularly with steps, and needs evaluation for elevated liver function tests      Possible Resolutions to Barriers:    See above      Discharge Planning/Teaching Needs:    HOme with wife who can provide supervision level only      Team Discussion:    Doing well-needs rail for steps.  Pain limits his progress-still managing balance with dosage.  Goals mod/i level   Revisions to Treatment Plan:    Upgraded goals to mod/i level    Continued Need for Acute  Rehabilitation Level of Care: The patient requires daily medical management by a physician with specialized training in physical medicine and rehabilitation for the following conditions: Daily direction of a multidisciplinary physical rehabilitation program to ensure safe treatment while eliciting the highest outcome that is of practical value to the patient.: Yes Daily medical management of patient stability for increased activity during participation in an intensive rehabilitation regime.: Yes Daily analysis of laboratory values and/or radiology reports with any subsequent need for medication adjustment of medical intervention for : Post surgical problems;Other  Lucy Chris 12/08/2012, 2:23 PM          Patient ID: Nathan Ramirez, male   DOB: 1945/10/24, 67 y.o.   MRN: [id number]

## 2012-12-08 NOTE — IPOC Note (Signed)
Overall Plan of Care [facility]) Patient Details Name: Nathan Ramirez MRN: [id number] DOB: 24-Mar-1945  Admitting Diagnosis: B TKR  Hospital Problems: Principal Problem:   Status post total bilateral knee replacement Active Problems:   Nonspecific abnormal results of liver function study   Elevated liver function tests     Functional Problem List: Nursing Bowel;Edema;Endurance;Medication Management;Motor;Pain;Safety;Skin Integrity  PT Balance;Edema;Endurance;Pain;Safety  OT Balance;Endurance;Pain  SLP    TR         Basic ADL's: OT Bathing;Dressing;Toileting     Advanced  ADL's: OT       Transfers: PT Bed Mobility;Bed to Chair;Car;Furniture  OT Toilet;Tub/Shower     Locomotion: PT Ambulation;Wheelchair Mobility;Stairs     Additional Impairments: OT None  SLP        TR      Anticipated Outcomes Item Anticipated Outcome  Self Feeding pt is I  Swallowing      Basic self-care  mod I  Toileting  mod I   Bathroom Transfers mod I  Bowel/Bladder  MOD I bowel and bladder  Transfers  Supervision  Locomotion  Supervision with RW  Communication     Cognition     Pain  Pain managed at or below level 7  Safety/Judgment  n/a   Therapy Plan: PT Intensity: Minimum of 1-2 x/day ,45 to 90 minutes PT Frequency: 5 out of 7 days PT Duration Estimated Length of Stay: 10 days OT Intensity: Minimum of 1-2 x/day, 45 to 90 minutes OT Frequency: 5 out of 7 days OT Duration/Estimated Length of Stay: 1.5 weeks         Team Interventions: Nursing Interventions Patient/Family Education;Skin Care/Wound Management;Bowel Management;Disease Management/Prevention;Discharge Planning;Pain Management;Psychosocial Support;Medication Management  PT interventions Ambulation/gait training;Balance/vestibular training;Community reintegration;Discharge planning;DME/adaptive equipment instruction;Functional electrical stimulation;Functional mobility training;Neuromuscular re-education;Pain  management;Patient/family education;Psychosocial support;Skin care/wound management;Splinting/orthotics;Stair training;Therapeutic Activities;Therapeutic Exercise;UE/LE Strength taining/ROM;Wheelchair propulsion/positioning  OT Interventions Financial controller;Functional mobility training;Pain management;Patient/family education;Self Care/advanced ADL retraining;Therapeutic Activities;Therapeutic Exercise;UE/LE Strength taining/ROM  SLP Interventions    TR Interventions    SW/CM Interventions Discharge Planning;Psychosocial Support;Patient/Family Education    Team Discharge Planning: Destination: PT-Home ,OT- Home , SLP-  Projected Follow-up: PT-Home health PT;24 hour supervision/assistance, OT-  Home health OT, SLP-  Projected Equipment Needs: PT-Rolling walker with 5" wheels;Other (comment) (PT may be able to use wife's RW, need to assess fit), OT- 3 in 1 bedside comode, SLP-  Equipment Details: PT-Has wfie's RW and shower chair, OT-  Patient/family involved in discharge planning: PT- Patient,  OT-Patient, SLP-   MD ELOS: 10 days Medical Rehab Prognosis:  Excellent Assessment: The patient has been admitted for CIR therapies. The team will be addressing, functional mobility, strength, stamina, balance, safety, adaptive techniques/equipment, self-care, bowel and bladder mgt, patient and caregiver education, knee ROM, ortho precautions, pain mgt. Goals have been set at [facility] I for self-care and supervision for basic mobility   See Team Conference Notes for weekly updates to the plan of care

## 2012-12-09 DIAGNOSIS — R7989 Other specified abnormal findings of blood chemistry: Secondary | ICD-10-CM | POA: Diagnosis not present

## 2012-12-09 DIAGNOSIS — K828 Other specified diseases of gallbladder: Secondary | ICD-10-CM | POA: Diagnosis not present

## 2012-12-09 LAB — PROTIME-INR
INR: 1.87 — ABNORMAL HIGH (ref 0.00–1.49)
Prothrombin Time: 21 seconds — ABNORMAL HIGH (ref 11.6–15.2)

## 2012-12-09 MED ORDER — WARFARIN SODIUM 5 MG PO TABS
5.0000 mg | ORAL_TABLET | Freq: Once | ORAL | Status: AC
  Administered 2012-12-09: 5 mg via ORAL
  Filled 2012-12-09: qty 1

## 2012-12-09 MED ORDER — LANSOPRAZOLE 15 MG PO TBDP
15.0000 mg | ORAL_TABLET | Freq: Every day | ORAL | Status: DC
  Administered 2012-12-09 – 2012-12-12 (×4): 15 mg via ORAL
  Filled 2012-12-09 (×5): qty 1

## 2012-12-09 NOTE — Progress Notes (Signed)
Subjective/Complaints: No new complaints and constipation  Objective: Vital Signs: Blood pressure 103/48, pulse 96, temperature 98.4 F (36.9 C), temperature source Oral, resp. rate 19, height 5\' 9"  (1.753 m), weight 104.01 kg (229 lb 4.8 oz), SpO2 96.00%. US Abdomen Complete  12/09/2012   CLINICAL DATA:  Elevated liver function studies.  EXAM: ULTRASOUND ABDOMEN COMPLETE  COMPARISON:  None.  FINDINGS: Gallbladder:  Tumefactive sludge in the gallbladder. No stones. No wall thickening. Murphy's sign is negative.  Common bile duct:  Diameter: 4.5 mm, normal.  Liver:  No focal lesion identified. Within normal limits in parenchymal echogenicity.  IVC:  Limited visualization due to bowel gas.  Pancreas:  Nonvisualization due to bowel gas.  Spleen:  Size and appearance within normal limits.  Right Kidney:  Length: 11.3 cm. Echogenicity within normal limits. No mass or hydronephrosis visualized.  Left Kidney:  Length: 11.3 cm. Echogenicity within normal limits. No mass or hydronephrosis visualized.  Abdominal aorta:  No aneurysm visualized.  Other findings:  Technically limited by prominent bowel gas.  IMPRESSION: Sludge in the gallbladder without additional inflammatory findings. No stones. Liver appears normal. No bile duct dilatation.   Electronically Signed   By: Burman Nieves M.D.   On: 12/09/2012 01:13    Recent Labs  12/07/12 0715  WBC 11.7*  HGB 8.6*  HCT 24.8*  PLT 295    Recent Labs  12/07/12 0715 12/08/12 0610  NA 131* 133*  K 3.8 3.7  CL 95* 96  CO2 26 25  GLUCOSE 110* 113*  BUN 15 14  CREATININE 1.00 0.95  CALCIUM 8.5 8.7    HEENT: normal Cardio: RRR Resp: CTA B/L GI: BS positive and Distention Extremity:  Pulses positive and Edema 1+ B Pre tib Skin:   Wound C/D/I Neuro: Alert/Oriented and Abnormal Motor Left lower extremity in CPM machine, right lower extremity 4 minus hip flexor knee extensor ankle dorsiflexor plantar Musc/Skel:  Swelling Bilateral knee  joint General no acute distress   Assessment/Plan: 1. Functional deficits secondary to B knee OA, s/p B TKR which require 3+ hours per day of interdisciplinary therapy in a comprehensive inpatient rehab setting.  Physiatrist is providing close team supervision and 24 hour management of active medical problems listed below.  Physiatrist and rehab team continue to assess barriers to discharge/monitor patient progress toward functional and medical goals. FIM: Bathing: 4: Min-Patient completes 8-9 58f 10 parts or 75+ percent  Upper body dressing/undressing: 5: Set-up assist to: Obtain clothing/put away Lower body dressing/undressing: 3: Mod-Patient completed 50-74% of tasks  FIM - Toileting Toileting: 0: Activity did not occur  FIM - Diplomatic Services operational officer Devices: Elevated toilet seat;Walker Toilet Transfers: 5-To toilet/BSC: Supervision (verbal cues/safety issues);5-From toilet/BSC: Supervision (verbal cues/safety issues)  FIM - Press photographer Assistive Devices: HOB elevated Bed/Chair Transfer: 5: Supine > Sit: Supervision (verbal cues/safety issues);4: Bed > Chair or W/C: Min A (steadying Pt. > 75%);4: Chair or W/C > Bed: Min A (steadying Pt. > 75%)  FIM - Locomotion: Wheelchair Distance: 150 Locomotion: Wheelchair: 0: Activity did not occur FIM - Locomotion: Ambulation Locomotion: Ambulation Assistive Devices: Designer, industrial/product Ambulation/Gait Assistance: 5: Supervision Locomotion: Ambulation: 5: Travels 150 ft or more with supervision/safety issues  Comprehension Comprehension Mode: Auditory Comprehension: 6-Follows complex conversation/direction: With extra time/assistive device  Expression Expression Mode: Verbal Expression: 7-Expresses complex ideas: With no assist  Social Interaction Social Interaction: 7-Interacts appropriately with others - No medications needed.  Problem Solving Problem Solving: 6-Solves complex  problems:  With extra time  Memory Memory: 7-Complete Independence: No helper  2. Anticoagulation/DVT prophylaxis with Pharmaceutical: Coumadin  3. Pain Management: oxy IR  Patient Active Problem List   Diagnosis Date Noted  . Nonspecific abnormal results of liver function study 12/08/2012  . Elevated liver function tests 12/08/2012  . Urinary retention 12/06/2012  . Status post total bilateral knee replacement 12/06/2012  . Hyponatremia 12/03/2012  . Sinus tachycardia by electrocardiogram 12/03/2012  . Postoperative anemia due to acute blood loss 12/02/2012  . OA (osteoarthritis) of knee 12/01/2012  . Medicare welcome visit 11/19/2012  . Other and unspecified hyperlipidemia 11/17/2012  . Preoperative evaluation to rule out surgical contraindication 10/11/2012  . Irritable bowel syndrome 10/01/2012  . GERD (gastroesophageal reflux disease) 09/19/2012  . Physical exam 06/15/2012  . Hyperlipidemia LDL goal <100 11/17/2011  . Obesity (BMI 30.0-34.9) 11/17/2011  . ALLERGIC RHINITIS CAUSE UNSPECIFIED 02/20/2010  . ASTHMA 02/20/2010    LOS: 3 days A FACE TO FACE EVALUATION WAS PERFORMED  KIRSTEINS,ANDREW E 12/09/2012, 9:12 AM

## 2012-12-09 NOTE — Progress Notes (Signed)
ANTICOAGULATION CONSULT NOTE - Follow Up Consult  Pharmacy Consult for Warfarin Indication: VTE prophylaxis s/p Bilateral TKA  No Known Allergies  Patient Measurements: Height: 5\' 9"  (175.3 cm) Weight: 229 lb 4.8 oz (104.01 kg) IBW/kg (Calculated) : 70.7  Vital Signs: Temp: 98.4 F (36.9 C) (11/27 0605) Temp src: Oral (11/27 0605) BP: 103/48 mmHg (11/27 0605) Pulse Rate: 96 (11/27 0605)  Labs:  Recent Labs  12/07/12 0715 12/08/12 0610 12/09/12 0500  HGB 8.6*  --   --   HCT 24.8*  --   --   PLT 295  --   --   LABPROT 24.9* 22.9* 21.0*  INR 2.34* 2.10* 1.87*  CREATININE 1.00 0.95  --     Estimated Creatinine Clearance: 92.1 ml/min (by C-G formula based on Cr of 0.95).  Assessment: 67 yo s/p bilateral TKA on 11/20, warfarin started for post-op VTE prophylaxis. INR down to 1.87 (slightly subtherapeutic). Appears dose not given on 11/26. Hgb down to 8.6 on 11/25 but no bleeding noted. Noted plan for 4 weeks of coumadin per ortho.  Goal of Therapy:  INR 2-3   Plan: Warfarin 5 mg po x 1 dose today.  Daily PT/INR  Christoper Fabian, PharmD, BCPS Clinical pharmacist, pager [phone] 12/09/2012  9:23 AM

## 2012-12-09 NOTE — Progress Notes (Signed)
Ultrasound to abdomen done at 0021. Patient tolerated well.

## 2012-12-10 ENCOUNTER — Inpatient Hospital Stay (HOSPITAL_COMMUNITY): Payer: Medicare Other | Admitting: Occupational Therapy

## 2012-12-10 ENCOUNTER — Inpatient Hospital Stay (HOSPITAL_COMMUNITY): Payer: Medicare Other | Admitting: *Deleted

## 2012-12-10 LAB — IRON AND TIBC
Iron: 24 ug/dL — ABNORMAL LOW (ref 42–135)
Saturation Ratios: 14 % — ABNORMAL LOW (ref 20–55)
TIBC: 169 ug/dL — ABNORMAL LOW (ref 215–435)
UIBC: 145 ug/dL (ref 125–400)

## 2012-12-10 LAB — FERRITIN: Ferritin: 1598 ng/mL — ABNORMAL HIGH (ref 22–322)

## 2012-12-10 LAB — PROTIME-INR
INR: 1.9 — ABNORMAL HIGH (ref 0.00–1.49)
Prothrombin Time: 21.2 seconds — ABNORMAL HIGH (ref 11.6–15.2)

## 2012-12-10 LAB — HEPATIC FUNCTION PANEL
ALT: 254 U/L — ABNORMAL HIGH (ref 0–53)
AST: 215 U/L — ABNORMAL HIGH (ref 0–37)
Albumin: 2.7 g/dL — ABNORMAL LOW (ref 3.5–5.2)
Alkaline Phosphatase: 256 U/L — ABNORMAL HIGH (ref 39–117)
Bilirubin, Direct: 0.5 mg/dL — ABNORMAL HIGH (ref 0.0–0.3)
Indirect Bilirubin: 0.9 mg/dL (ref 0.3–0.9)
Total Bilirubin: 1.4 mg/dL — ABNORMAL HIGH (ref 0.3–1.2)
Total Protein: 6.1 g/dL (ref 6.0–8.3)

## 2012-12-10 MED ORDER — WARFARIN SODIUM 5 MG PO TABS
5.0000 mg | ORAL_TABLET | Freq: Once | ORAL | Status: AC
  Administered 2012-12-10: 5 mg via ORAL
  Filled 2012-12-10: qty 1

## 2012-12-10 NOTE — Progress Notes (Signed)
Physical Therapy Session Note  Patient Details  Name: Nathan Ramirez MRN: [id number] Date of Birth: 1945-09-04  Today's Date: 12/10/2012 Time:  - 10:15-11:00 ( ) and 13:20-14:00 ( )     Short Term Goals: Week 1:  PT Short Term Goal 1 (Week 1): STG=LTG due to LOS ~Mod I/S overall  Skilled Therapeutic Interventions/Progress Updates:  1:2  Tx focused on gait with RW, therex for strength and ROM, and stairs/curb training.  Sit<>supine on mat x2 Mod I Sit<>stand with S, Stand-step with Min A for steadying from lower surface Supien SLR x10 bil with near full ext Gait in controlled environemtn 2x150 and con carpet with S and cues for reducing UE weight-bearing as able, improved knee flexion noted in swing bilaterally. Seated LAQ with 5 sec hold x10, seated passive HS stretch on therapist knee x17min, the x10 quad sets in same position.   2:2 Wife present. Provided family training on gait with RW, car transfer, and stairs. Also performed ROM on Nustep x48min with bil UE and LE for strength and ROM.  Gait in controlled setting x150' Mod I with good technique. Wife checked off to walk with pt in hall over weekend.  Car transfer with S, longsitting into backseat.   Stairs with R rail x5 with S and no cues needed.   Demonstrated curb technique, pt able to return-demo with RW and S and safe technique  Hand off to OT      Therapy Documentation Precautions:  Precautions Precautions: Knee;Fall Precaution Comments: Needs L KI for walking< R KI discontionued due to 10 SLR wtih <10 deg lag Required Braces or Orthoses: Knee Immobilizer - Left Knee Immobilizer - Right: Discontinue once straight leg raise with < 10 degree lag Knee Immobilizer - Left: Discontinue once straight leg raise with < 10 degree lag Restrictions Weight Bearing Restrictions: No Other Position/Activity Restrictions: WBAT General:   Vital Signs: Oxygen Therapy SpO2: 95 % O2 Device: None (Room air) Pain: 7/10  knee soreness   Locomotion : Ambulation Ambulation/Gait Assistance: 5: Supervision   See FIM for current functional status  Therapy/Group: Individual Therapy  Clydene Laming, PT, DPT   12/10/2012, 10:44 AM

## 2012-12-10 NOTE — Progress Notes (Signed)
Subjective/Complaints: No abdominal complaints Appreciate gastroenterology consultation   Objective: Vital Signs: Blood pressure 107/60, pulse 93, temperature 98.1 F (36.7 C), temperature source Oral, resp. rate 16, height 5\' 9"  (1.753 m), weight 104.01 kg (229 lb 4.8 oz), SpO2 95.00%. US Abdomen Complete  12/09/2012   CLINICAL DATA:  Elevated liver function studies.  EXAM: ULTRASOUND ABDOMEN COMPLETE  COMPARISON:  None.  FINDINGS: Gallbladder:  Tumefactive sludge in the gallbladder. No stones. No wall thickening. Murphy's sign is negative.  Common bile duct:  Diameter: 4.5 mm, normal.  Liver:  No focal lesion identified. Within normal limits in parenchymal echogenicity.  IVC:  Limited visualization due to bowel gas.  Pancreas:  Nonvisualization due to bowel gas.  Spleen:  Size and appearance within normal limits.  Right Kidney:  Length: 11.3 cm. Echogenicity within normal limits. No mass or hydronephrosis visualized.  Left Kidney:  Length: 11.3 cm. Echogenicity within normal limits. No mass or hydronephrosis visualized.  Abdominal aorta:  No aneurysm visualized.  Other findings:  Technically limited by prominent bowel gas.  IMPRESSION: Sludge in the gallbladder without additional inflammatory findings. No stones. Liver appears normal. No bile duct dilatation.   Electronically Signed   By: Burman Nieves M.D.   On: 12/09/2012 01:13   Results for orders placed during the hospital encounter of 12/06/12 (from the past 72 hour(s))  PROTIME-INR     Status: Abnormal   Collection Time    12/08/12  6:10 AM      Result Value Range   Prothrombin Time 22.9 (*) 11.6 - 15.2 seconds   INR 2.10 (*) 0.00 - 1.49  COMPREHENSIVE METABOLIC PANEL     Status: Abnormal   Collection Time    12/08/12  6:10 AM      Result Value Range   Sodium 133 (*) 135 - 145 mEq/L   Potassium 3.7  3.5 - 5.1 mEq/L   Chloride 96  96 - 112 mEq/L   CO2 25  19 - 32 mEq/L   Glucose, Bld 113 (*) 70 - 99 mg/dL   BUN 14  6 - 23 mg/dL    Creatinine, Ser 1.61  0.50 - 1.35 mg/dL   Calcium 8.7  8.4 - 09.6 mg/dL   Total Protein 6.0  6.0 - 8.3 g/dL   Albumin 2.5 (*) 3.5 - 5.2 g/dL   AST 045 (*) 0 - 37 U/L   ALT 196 (*) 0 - 53 U/L   Alkaline Phosphatase 214 (*) 39 - 117 U/L   Total Bilirubin 1.1  0.3 - 1.2 mg/dL   GFR calc non Af Amer 85 (*) >90 mL/min   GFR calc Af Amer >90  >90 mL/min   Comment: (NOTE)     The eGFR has been calculated using the CKD EPI equation.     This calculation has not been validated in all clinical situations.     eGFR's persistently <90 mL/min signify possible Chronic Kidney     Disease.  HEPATITIS PANEL, ACUTE     Status: None   Collection Time    12/08/12  4:52 PM      Result Value Range   Hepatitis B Surface Ag NEGATIVE  NEGATIVE   HCV Ab NEGATIVE  NEGATIVE   Hep A IgM NON REACTIVE  NON REACTIVE   Hep B C IgM NON REACTIVE  NON REACTIVE   Comment: (NOTE)     High levels of Hepatitis B Core IgM antibody are detectable     during the acute stage  of Hepatitis B. This antibody is used     to differentiate current from past HBV infection.     Performed at [facility]  PROTIME-INR     Status: Abnormal   Collection Time    12/09/12  5:00 AM      Result Value Range   Prothrombin Time 21.0 (*) 11.6 - 15.2 seconds   INR 1.87 (*) 0.00 - 1.49  PROTIME-INR     Status: Abnormal   Collection Time    12/10/12  6:43 AM      Result Value Range   Prothrombin Time 21.2 (*) 11.6 - 15.2 seconds   INR 1.90 (*) 0.00 - 1.49        Assessment/Plan: 1. 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 Physiatrist is providing close team supervision and 24 hour management of active medical problems listed below. Physiatrist and rehab team continue to assess barriers to discharge/monitor patient progress toward functional and medical goals. FIM: FIM -  Bathing Bathing Steps Patient Completed: Chest;Right Arm;Left Arm;Abdomen;Left upper leg;Right upper leg;Buttocks;Front perineal area Bathing: 4: Min-Patient completes 8-9 52f 10 parts or 75+ percent  FIM - Upper Body Dressing/Undressing Upper body dressing/undressing steps patient completed: Thread/unthread right sleeve of pullover shirt/dresss;Thread/unthread left sleeve of pullover shirt/dress;Put head through opening of pull over shirt/dress;Pull shirt over trunk Upper body dressing/undressing: 5: Set-up assist to: Obtain clothing/put away FIM - Lower Body Dressing/Undressing Lower body dressing/undressing steps patient completed: Thread/unthread right underwear leg;Thread/unthread left underwear leg;Pull underwear up/down;Thread/unthread right pants leg;Thread/unthread left pants leg;Pull pants up/down (used reacher) Lower body dressing/undressing: 3: Mod-Patient completed 50-74% of tasks  FIM - Toileting Toileting: 0: Activity did not occur  FIM - Diplomatic Services operational officer Devices: Elevated toilet seat;Walker Toilet Transfers: 5-To toilet/BSC: Supervision (verbal cues/safety issues);5-From toilet/BSC: Supervision (verbal cues/safety issues)  FIM - Press photographer Assistive Devices: HOB elevated Bed/Chair Transfer: 5: Supine > Sit: Supervision (verbal cues/safety issues);4: Bed > Chair or W/C: Min A (steadying Pt. > 75%);4: Chair or W/C > Bed: Min A (steadying Pt. > 75%)  FIM - Locomotion: Wheelchair Distance: 150 Locomotion: Wheelchair: 0: Activity did not occur FIM - Locomotion: Ambulation Locomotion: Ambulation Assistive Devices: Designer, industrial/product Ambulation/Gait Assistance: 5: Supervision Locomotion: Ambulation: 5: Travels 150 ft or more with supervision/safety issues  Comprehension Comprehension Mode: Auditory Comprehension: 7-Follows complex conversation/direction: With no assist  Expression Expression Mode: Verbal Expression:  7-Expresses complex ideas: With no assist  Social Interaction Social Interaction: 7-Interacts appropriately with others - No medications needed.  Problem Solving Problem Solving: 7-Solves complex problems: Recognizes & self-corrects  Memory Memory: 7-Complete Independence: No helper  Medical Problem List and Plan:  1. 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 DVT Prophylaxis/Anticoagulation: Coumadin for DVT prophylaxis. Continue Lovenox for INR greater than 2.00. Check vascular study  3. Pain Management: Oxycodone and Robaxin as needed. Monitor with increased mobility  4. Neuropsych: This patient is capable of making decisions on his own behalf.  5. Acute blood loss anemia. Followup CBC  6. GERD. Prevacid  7. Asthma.Dulera 2 puffs twice a day. No complaints of shortness of breath  8. Irritable bowel syndrome. Bentyl 10 mg 3 times a day.Will hold and give dulc supp  9.  Elevated LFT  D/c ed  Acetaminophen but still going up.  No obvious meds, negative abdominal ultrasound,  negative hepatitis panel     LOS (Days) 4 A FACE TO FACE EVALUATION WAS PERFORMED  KIRSTEINS,ANDREW E 12/10/2012, 9:55 AM

## 2012-12-10 NOTE — Plan of Care (Signed)
Problem: RH BOWEL ELIMINATION Goal: RH STG MANAGE BOWEL WITH ASSISTANCE STG Manage Bowel with MOD I Assistance.  Outcome: Not Progressing Patient hasn't had BM since 11/23.  History of IBS.  States this is normal for him.  Refuses laxatives/suppository.

## 2012-12-10 NOTE — Progress Notes (Signed)
[facility] Progress Note  Subjective:  Doing well with therapy.  No complaints.  Objective:  Vital signs in last 24 hours: Temp:  [98 F (36.7 C)-98.1 F (36.7 C)] 98.1 F (36.7 C) (11/28 0553) Pulse Rate:  [93-98] 93 (11/28 0553) Resp:  [16-18] 16 (11/28 0553) BP: (107-118)/(60-73) 107/60 mmHg (11/28 0553) SpO2:  [94 %-97 %] 95 % (11/28 0700) Last BM Date: 12/05/12 General:  Alert, Well-developed, in NAD Heart:  Regular rate and rhythm; no murmurs Pulm:  CTAB.  No W/R/R. Abdomen:  Soft, non-distended.  BS present.  Non-tender. Neurologic:  Alert and  oriented x4;  grossly normal neurologically. Psych:  Alert and cooperative. Normal mood and affect.  Intake/Output from previous day: 11/27 0701 - 11/28 0700 In: 600 [P.O.:600] Out: 200 [Urine:200] Intake/Output this shift: Total I/O In: 480 [P.O.:480] Out: 650 [Urine:650]  Lab Results: No results found for this basename: WBC, HGB, HCT, PLT,  in the last 72 hours BMET  Recent Labs  12/08/12 0610  NA 133*  K 3.7  CL 96  CO2 25  GLUCOSE 113*  BUN 14  CREATININE 0.95  CALCIUM 8.7   LFT  Recent Labs  12/08/12 0610  PROT 6.0  ALBUMIN 2.5*  AST 187*  ALT 196*  ALKPHOS 214*  BILITOT 1.1   PT/INR  Recent Labs  12/09/12 0500 12/10/12 0643  LABPROT 21.0* 21.2*  INR 1.87* 1.90*   Hepatitis Panel  Recent Labs  12/08/12 1652  HEPBSAG NEGATIVE  HCVAB NEGATIVE  HEPAIGM NON REACTIVE  HEPBIGM NON REACTIVE    US Abdomen Complete  12/09/2012   CLINICAL DATA:  Elevated liver function studies.  EXAM: ULTRASOUND ABDOMEN COMPLETE  COMPARISON:  None.  FINDINGS: Gallbladder:  Tumefactive sludge in the gallbladder. No stones. No wall thickening. Murphy's sign is negative.  Common bile duct:  Diameter: 4.5 mm, normal.  Liver:  No focal lesion identified. Within normal limits in parenchymal echogenicity.  IVC:  Limited visualization due to bowel gas.  Pancreas:  Nonvisualization due to bowel gas.   Spleen:  Size and appearance within normal limits.  Right Kidney:  Length: 11.3 cm. Echogenicity within normal limits. No mass or hydronephrosis visualized.  Left Kidney:  Length: 11.3 cm. Echogenicity within normal limits. No mass or hydronephrosis visualized.  Abdominal aorta:  No aneurysm visualized.  Other findings:  Technically limited by prominent bowel gas.  IMPRESSION: Sludge in the gallbladder without additional inflammatory findings. No stones. Liver appears normal. No bile duct dilatation.   Electronically Signed   By: Burman Nieves M.D.   On: 12/09/2012 01:13    Assessment / Plan: *Elevated transaminases and ALP:  Ultrasound shows only GB sludge.  No LFT's have been checked since 11/26.  Viral hepatitis panel negative.  Will check remaining liver serologies including iron studies, ANA, AMA, ASMA, and alpha 1-antitrypsin.  Recheck LFT's as well. *11/19 bil TKR.   *Hx GERD, sxs controlled with Lansoprazole.  No previous EGD.    LOS: 4 days   ZEHR, JESSICA D.  12/10/2012, 11:32 AM  Pager number [phone]     Attending physician's note   I have taken an interval history, reviewed the chart and examined the patient. I agree with the Advanced Practitioner's note, impression and recommendations. Ordering hepatic serologies as outlined above. No additional inpatient evaluation needed at this time. This can be followed as an outpatient by his Gastroenterologist in [place].  Venita Lick. Russella Dar, MD [facility]

## 2012-12-10 NOTE — Progress Notes (Signed)
ANTICOAGULATION CONSULT NOTE - Follow Up Consult  Pharmacy Consult for Warfarin Indication: VTE prophylaxis s/p Bilateral TKA  No Known Allergies  Patient Measurements: Height: 5\' 9"  (175.3 cm) Weight: 229 lb 4.8 oz (104.01 kg) IBW/kg (Calculated) : 70.7  Vital Signs: Temp: 98.1 F (36.7 C) (11/28 0553) Temp src: Oral (11/28 0553) BP: 107/60 mmHg (11/28 0553) Pulse Rate: 93 (11/28 0553)  Labs:  Recent Labs  12/08/12 0610 12/09/12 0500 12/10/12 0643  LABPROT 22.9* 21.0* 21.2*  INR 2.10* 1.87* 1.90*  CREATININE 0.95  --   --     Estimated Creatinine Clearance: 92.1 ml/min (by C-G formula based on Cr of 0.95).  Assessment: 67 yo s/p bilateral TKA on 11/20, warfarin started for post-op VTE prophylaxis. INR 1.9 (slightly subtherapeutic). No bleeding noted. Noted plan for 4 weeks of coumadin per ortho.  Goal of Therapy:  INR 2-3   Plan: Warfarin 5 mg po x 1 dose today.  Daily PT/INR  Bayard Hugger, PharmD, BCPS  Clinical Pharmacist  Pager: [phone]  12/10/2012  11:16 AM

## 2012-12-10 NOTE — Progress Notes (Signed)
Occupational Therapy Session Note  Patient Details  Name: Nathan Ramirez MRN: [id number] Date of Birth: Jun 13, 1945  Today's Date: 12/10/2012 Time: 1400-1445 Time Calculation (min): 45 min  Short Term Goals: Week 1:  OT Short Term Goal 1 (Week 1): STGs = LTGs due to the short LOS  Skilled Therapeutic Interventions/Progress Updates:  Upon arrival, patient resting on EOB with wife present and patient just finished his PT session.  Engaged patient in shower chair transfers, functional mobility with RW and walker safety, and discussion of bathroom set up and need for DME.  Shower chair in patients room adjusted to highest position and patient able to perform sit><stands without assistance.  Wife will place the shower chair they have at home in the walk in shower to see if it fits and measure the space as well as door width.  After review of standard walker safety techniques, patient performed a simple HM task using RW to retrieve 2 large items and problem solve how to carry them to the closet to put them away while managing through a tight space.  Patient required 2 vcs for safety.  Patient and wife want to be certain that patient receives a 3 in 1 commode therefore this OT spoke with SW who comfirmed that 3 in 1 will be ordered.  Issued a reacher and walker bag.   Therapy Documentation Precautions:  Precautions Precautions: Knee;Fall Precaution Comments: Needs L KI for walking< R KI discontionued due to 10 SLR wtih <10 deg lag Required Braces or Orthoses: Knee Immobilizer - Left Knee Immobilizer - Right: Discontinue once straight leg raise with < 10 degree lag Knee Immobilizer - Left: Discontinue once straight leg raise with < 10 degree lag Restrictions Weight Bearing Restrictions: No Other Position/Activity Restrictions: WBAT Pain: No reports of pain  Therapy/Group: Individual Therapy  Vaani Morren 12/10/2012, 3:49 PM

## 2012-12-10 NOTE — Progress Notes (Signed)
Occupational Therapy Session Note  Patient Details  Name: Nathan Ramirez MRN: [id number] Date of Birth: March 15, 1945  Today's Date: 12/10/2012 Time: 0905-1000 Time Calculation (min): 55 min  Short Term Goals: No short term goals set  Skilled Therapeutic Interventions/Progress Updates:    Pt seen for BADL retraining of B/D at shower level with a focus on functional mobility.  Pt sat to EOB, L knee immobilizer applied. Pt got up and ambulated to shower stall with RW with supervision and then sat down on chair. Knee immobilizer removed and pt was then able to doff his clothing without assistance.  He showered using long sponge and grab bars for support in standing.  He dried off and was able to reach to his feet to dry them.  He donned underwear without AE and then KI reapplied. Because of the KI and tight space, pt needed min assist to stand up. He then ambulated to bed to continue dressing. He continues to need A with donning Ted hose and shoes.  He will probably do fine with shoe horn.  Pt then ambulated to sink and completed grooming in standing and then to gym. In the gym, Pt worked on UE strengthening with 5 lb dowel bar with sh presses, chest presses, and elbow flexion. At end of session, pt rested supine on mat to wait for his next PT session that was about to start.  Therapy Documentation Precautions:  Precautions Precautions: Knee;Fall Precaution Comments: Needs L KI for walking< R KI discontionued due to 10 SLR wtih <10 deg lag Required Braces or Orthoses: Knee Immobilizer - Left Knee Immobilizer - Right: Discontinue once straight leg raise with < 10 degree lag Knee Immobilizer - Left: Discontinue once straight leg raise with < 10 degree lag Restrictions Weight Bearing Restrictions: No Other Position/Activity Restrictions: WBAT   Pain:  pt stated he had mild pain in knees, but had already received his medication   ADL:  See FIM for current functional  status  Therapy/Group:  SAGUIER,JULIA 12/10/2012, 12:10 PM

## 2012-12-10 NOTE — Progress Notes (Signed)
Social Work Patient ID: Nathan Ramirez, male   DOB: [birth year], 67 y.o.   MRN: [id number] Met with pt to discuss DME and follow up.  He already has [place] following for home health.  He is agreeable to rolling walker and bsc. Will  order the DME and prepare for discharge Monday.

## 2012-12-11 ENCOUNTER — Inpatient Hospital Stay (HOSPITAL_COMMUNITY): Payer: Medicare Other | Admitting: Physical Therapy

## 2012-12-11 ENCOUNTER — Inpatient Hospital Stay (HOSPITAL_COMMUNITY): Payer: Medicare Other | Admitting: Occupational Therapy

## 2012-12-11 ENCOUNTER — Inpatient Hospital Stay (HOSPITAL_COMMUNITY): Payer: Medicare Other

## 2012-12-11 LAB — PROTIME-INR
INR: 2 — ABNORMAL HIGH (ref 0.00–1.49)
Prothrombin Time: 22.1 seconds — ABNORMAL HIGH (ref 11.6–15.2)

## 2012-12-11 MED ORDER — WARFARIN SODIUM 5 MG PO TABS
5.0000 mg | ORAL_TABLET | Freq: Once | ORAL | Status: AC
  Administered 2012-12-11: 5 mg via ORAL
  Filled 2012-12-11: qty 1

## 2012-12-11 NOTE — Progress Notes (Signed)
Physical Therapy Note  Patient Details  Name: Nathan Ramirez MRN: [id number] Date of Birth: 1945/07/23 Today's Date: 12/11/2012  1100-1155 (55 minutes) group Pain: no complaint of pain Pt participated in PT group session focused on gait training/ safety/endurance; pt ambulates on unit with RW 150 feet + SBA; up /down 2-3 steps with one rail right SBA step to step sideways; up/down curb with RW (forward) SBA; Nustep Level 4 x 15 minutes for warmup / bilateral AROM; standing marching in place without UE assist (RW in front of patient if needed) ; supine quad sets 2 X  20 for bilateral knee extension.   Pete Schnitzer,JIM 12/11/2012, 10:56 AM

## 2012-12-11 NOTE — Progress Notes (Signed)
Subjective/Complaints: No abdominal complaints Appreciate gastroenterology consultation, no further inpt w/u unless abdominal pain occurs Doing well with PT, AROM 95 deg per PT  Objective: Vital Signs: Blood pressure 113/70, pulse 91, temperature 98.4 F (36.9 C), temperature source Oral, resp. rate 16, height 5\' 9"  (1.753 m), weight 104.01 kg (229 lb 4.8 oz), SpO2 94.00%. No results found. Results for orders placed during the hospital encounter of 12/06/12 (from the past 72 hour(s))  HEPATITIS PANEL, ACUTE     Status: None   Collection Time    12/08/12  4:52 PM      Result Value Range   Hepatitis B Surface Ag NEGATIVE  NEGATIVE   HCV Ab NEGATIVE  NEGATIVE   Hep A IgM NON REACTIVE  NON REACTIVE   Hep B C IgM NON REACTIVE  NON REACTIVE   Comment: (NOTE)     High levels of Hepatitis B Core IgM antibody are detectable     during the acute stage of Hepatitis B. This antibody is used     to differentiate current from past HBV infection.     Performed at [facility]  PROTIME-INR     Status: Abnormal   Collection Time    12/09/12  5:00 AM      Result Value Range   Prothrombin Time 21.0 (*) 11.6 - 15.2 seconds   INR 1.87 (*) 0.00 - 1.49  PROTIME-INR     Status: Abnormal   Collection Time    12/10/12  6:43 AM      Result Value Range   Prothrombin Time 21.2 (*) 11.6 - 15.2 seconds   INR 1.90 (*) 0.00 - 1.49  HEPATIC FUNCTION PANEL     Status: Abnormal   Collection Time    12/10/12 12:45 PM      Result Value Range   Total Protein 6.1  6.0 - 8.3 g/dL   Albumin 2.7 (*) 3.5 - 5.2 g/dL   AST 161 (*) 0 - 37 U/L   ALT 254 (*) 0 - 53 U/L   Alkaline Phosphatase 256 (*) 39 - 117 U/L   Total Bilirubin 1.4 (*) 0.3 - 1.2 mg/dL   Bilirubin, Direct 0.5 (*) 0.0 - 0.3 mg/dL   Indirect Bilirubin 0.9  0.3 - 0.9 mg/dL  FERRITIN     Status: Abnormal   Collection Time    12/10/12 12:45 PM      Result Value Range   Ferritin 1598 (*) 22 - 322 ng/mL   Comment: Performed at [facility]  IRON AND TIBC     Status: Abnormal   Collection Time    12/10/12 12:45 PM      Result Value Range   Iron 24 (*) 42 - 135 ug/dL   TIBC 096 (*) 045 - 409 ug/dL   Saturation Ratios 14 (*) 20 - 55 %   UIBC 145  125 - 400 ug/dL   Comment: Performed at [facility]  PROTIME-INR     Status: Abnormal   Collection Time    12/11/12  5:15 AM      Result Value Range   Prothrombin Time 22.1 (*) 11.6 - 15.2 seconds   INR 2.00 (*) 0.00 - 1.49        Assessment/Plan: 1. 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 Physiatrist is providing close team supervision and 24 hour management of  active medical problems listed below. Physiatrist and rehab team continue to assess barriers to discharge/monitor patient progress toward functional and medical goals. FIM: FIM - Bathing Bathing Steps Patient Completed: Chest;Right Arm;Left Arm;Abdomen;Left upper leg;Right upper leg;Buttocks;Front perineal area;Right lower leg (including foot);Left lower leg (including foot) (used long sponge) Bathing: 5: Supervision: Safety issues/verbal cues  FIM - Upper Body Dressing/Undressing Upper body dressing/undressing steps patient completed: Thread/unthread right sleeve of pullover shirt/dresss;Thread/unthread left sleeve of pullover shirt/dress;Put head through opening of pull over shirt/dress;Pull shirt over trunk Upper body dressing/undressing: 5: Set-up assist to: Obtain clothing/put away FIM - Lower Body Dressing/Undressing Lower body dressing/undressing steps patient completed: Thread/unthread right underwear leg;Thread/unthread left underwear leg;Pull underwear up/down;Thread/unthread right pants leg;Thread/unthread left pants leg;Pull pants up/down Lower body dressing/undressing: 3: Mod-Patient completed 50-74% of tasks  FIM - Toileting Toileting: 0:  Activity did not occur  FIM - Diplomatic Services operational officer Devices: Elevated toilet seat;Walker Toilet Transfers: 5-To toilet/BSC: Supervision (verbal cues/safety issues);5-From toilet/BSC: Supervision (verbal cues/safety issues)  FIM - Banker Devices: Walker;Arm rests Bed/Chair Transfer: 6: Supine > Sit: No assist;5: Bed > Chair or W/C: Supervision (verbal cues/safety issues);6: Chair or W/C > Bed: No assist  FIM - Locomotion: Wheelchair Distance: 150 Locomotion: Wheelchair: 0: Activity did not occur FIM - Locomotion: Ambulation Locomotion: Ambulation Assistive Devices: Designer, industrial/product Ambulation/Gait Assistance: 6: Modified independent (Device/Increase time) Locomotion: Ambulation: 6: Travels 150 ft or more with assistive device/no helper  Comprehension Comprehension Mode: Auditory Comprehension: 7-Follows complex conversation/direction: With no assist  Expression Expression Mode: Verbal Expression: 7-Expresses complex ideas: With no assist  Social Interaction Social Interaction: 7-Interacts appropriately with others - No medications needed.  Problem Solving Problem Solving: 7-Solves complex problems: Recognizes & self-corrects  Memory Memory: 7-Complete Independence: No helper  Medical Problem List and Plan:  1. 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 DVT Prophylaxis/Anticoagulation: Therapeutic Coumadin for DVT prophylaxis. Continue Lovenox for INR greater than 2.00.  3. Pain Management: Oxycodone and Robaxin as needed. Monitor with increased mobility  4. Neuropsych: This patient is capable of making decisions on his own behalf.  5. Acute blood loss anemia. Followup CBC 11/29 6. GERD. Prevacid  7. Asthma.Dulera 2 puffs twice a day. No complaints of shortness of breath  8. Irritable bowel syndrome. Bentyl 10 mg 3 times a day.Will hold and give dulc supp   9.  Elevated LFT asymptomatic D/c ed  Acetaminophen but still going up.  No obvious meds, negative abdominal ultrasound, negative hepatitis panel outpat f/u with GI    LOS (Days) 5 A FACE TO FACE EVALUATION WAS PERFORMED  Domingo Fuson E 12/11/2012, 9:33 AM

## 2012-12-11 NOTE — Progress Notes (Addendum)
Physical Therapy Note  Patient Details  Name: SIAH STEELY MRN: [id number] Date of Birth: 1945-10-09 Today's Date: 12/11/2012 6213-0865 35 minutes individual therapy Pain rated 7/10, premedicated  Therapeutic exercise performed with bil LE to increase strength for functional mobility: HEP hand out for TKR , 10 x 1 each, and standing calf raises for PF strength.  Gait in controlled x 200' modified independent ; in community environments, x 200' with distant supervision.  Advanced gait backwards, sideways in congested areas, all with RW, pulled out dining chair, sat at table and arose from chair with distant supervision.Marland Kitchen Up/down 5 steps with supervision, without cues..  Pt left supine in bed with ice packs on bil knees.  D/c plan for Monday dec 1.  Laurren Lepkowski 12/11/2012, 8:18 AM

## 2012-12-11 NOTE — Progress Notes (Signed)
Occupational Therapy Session Note  Patient Details  Name: Nathan Ramirez MRN: [id number] Date of Birth: 1945/01/26  Today's Date: 12/11/2012 Time: 0930-1015 Time Calculation (min): 45 min  Short Term Goals: No short term goals set  Skilled Therapeutic Interventions/Progress Updates:      Pt seen for BADL retraining of toileting, bathing, and dressing with a focus on functional mobility with RW.  Pt no longer needs the L KI so he is now very independent with his mobility. He had no difficulty with elevated toilet seat or shower chair transfers. He no longer needs a reacher for dressing, but does use a shoe horn to don shoes.  Pt worked on ambulating from room to gym and back with RW with one short rest break. Pt states he is very ready to go home and he feels safe and independent.  Pt would benefit from further mobility training and UE strengthening this afternoon.  Pt resting in bed with ice on his knees at end of session with call light in reach.  Therapy Documentation Precautions:  Precautions Precautions: Knee;Fall Precaution Comments: Needs L KI for walking< R KI discontionued due to 10 SLR wtih <10 deg lag Required Braces or Orthoses: Knee Immobilizer - Left Knee Immobilizer - Right: Discontinue once straight leg raise with < 10 degree lag Knee Immobilizer - Left: Discontinue once straight leg raise with < 10 degree lag Restrictions Weight Bearing Restrictions: No Other Position/Activity Restrictions: WBAT    Vital Signs: Oxygen Therapy SpO2: 95 % O2 Device: None (Room air) Pain: Pain Assessment Pain Assessment: 0-10 Pain Score: 3  Pain Type: Surgical pain Pain Intervention(s):  (Pt stated he could tolerate the pain)  ADL:  See FIM for current functional status  Therapy/Group: Individual Therapy  Dovey Fatzinger 12/11/2012, 11:46 AM

## 2012-12-11 NOTE — Progress Notes (Signed)
Occupational Therapy Session Note  Patient Details  Name: Nathan Ramirez MRN: [id number] Date of Birth: [birth year]  Today's Date: 12/11/2012 Time: 2130-8657 Time Calculation (min): 43 min  Short Term Goals: Week 1:  OT Short Term Goal 1 (Week 1): STGs = LTGs due to the short LOS  Skilled Therapeutic Interventions/Progress Updates:    Therapy session focused on UE strengthening, activity tolerance, and BLE strengthening. Pt received supine in bed and ready for therapy session. Ambulated with RW at Mod I to therapy gym and engaged in BUE strengthening exercises using 8 lb hand weight 2 sets of 20 reps for 3 exercises. Pt ambulated to other therapy gym and completed nustep exercise for 15 min with workload 6 to increase BLE strengthening and ROM. 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 At end of session pt returned to room and left supine in bed with all needs in reach.   Therapy Documentation Precautions:  Precautions Precautions: Knee;Fall Precaution Comments: Needs L KI for walking< R KI discontionued due to 10 SLR wtih <10 deg lag Required Braces or Orthoses: Knee Immobilizer - Left Knee Immobilizer - Right: Discontinue once straight leg raise with < 10 degree lag Knee Immobilizer - Left: Discontinue once straight leg raise with < 10 degree lag Restrictions Weight Bearing Restrictions: No Other Position/Activity Restrictions: WBAT General:   Vital Signs: Therapy Vitals Temp: 99 F (37.2 C) Temp src: Oral Pulse Rate: 97 Resp: 16 BP: 108/64 mmHg Patient Position, if appropriate: Lying Oxygen Therapy SpO2: 95 % O2 Device: None (Room air) Pain: Pt reporting "little pain" in B knees.   See FIM for current functional status  Therapy/Group: Individual Therapy  Daneil Dan 12/11/2012, 3:02 PM

## 2012-12-11 NOTE — Progress Notes (Signed)
ANTICOAGULATION CONSULT NOTE - Follow Up Consult  Pharmacy Consult for Warfarin Indication: VTE prophylaxis s/p Bilateral TKA  No Known Allergies  Patient Measurements: Height: 5\' 9"  (175.3 cm) Weight: 229 lb 4.8 oz (104.01 kg) IBW/kg (Calculated) : 70.7  Vital Signs: Temp: 98.4 F (36.9 C) (11/29 0435) Temp src: Oral (11/29 0435) BP: 113/70 mmHg (11/29 0435) Pulse Rate: 91 (11/29 0435)  Labs:  Recent Labs  12/09/12 0500 12/10/12 0643 12/11/12 0515  LABPROT 21.0* 21.2* 22.1*  INR 1.87* 1.90* 2.00*    Estimated Creatinine Clearance: 92.1 ml/min (by C-G formula based on Cr of 0.95).  Assessment: 67 yo s/p bilateral TKA on 11/20, warfarin started for post-op VTE prophylaxis. INR 2 (therapeutic). No bleeding noted. Noted plan for 4 weeks of coumadin per ortho.  Goal of Therapy:  INR 2-3   Plan: Warfarin 5 mg po again today.  Daily PT/INR  Christoper Fabian, PharmD, BCPS Clinical pharmacist, pager [phone] 12/11/2012  8:28 AM

## 2012-12-12 LAB — PROTIME-INR
INR: 2.23 — ABNORMAL HIGH (ref 0.00–1.49)
Prothrombin Time: 24 seconds — ABNORMAL HIGH (ref 11.6–15.2)

## 2012-12-12 MED ORDER — WARFARIN SODIUM 3 MG PO TABS
3.0000 mg | ORAL_TABLET | Freq: Once | ORAL | Status: AC
  Administered 2012-12-12: 3 mg via ORAL
  Filled 2012-12-12: qty 1

## 2012-12-12 NOTE — Discharge Summary (Signed)
NAMEMarland Kitchen  Ramirez, Nathan NO.:  [id number]  MEDICAL RECORD NO.:  [id number]  LOCATION:  4M07C                        FACILITY:  [facility]  PHYSICIAN:  Erick Colace, M.D.DATE OF BIRTH:  [birth year]  DATE OF ADMISSION:  12/06/2012 DATE OF DISCHARGE:  12/13/2012                              DISCHARGE SUMMARY   DISCHARGE DIAGNOSES: 1. 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 2. Coumadin for deep venous thrombosis prophylaxis, pain management. 3. Acute blood loss anemia. 4. Gastroesophageal reflux disease. 5. Asthma. 6. Irritable bowel syndrome.  HISTORY OF PRESENT ILLNESS:  This is a 67 year old right-handed male admitted on December 01, 2012, with end-stage degenerative changes of both knees.  Symptoms reported included pain, aching, stiffness with instability.  No relief with conservative care.  Underwent bilateral total knee arthroplasty on December 01, 2012, per Dr. Lequita Halt. Postoperative pain management with epidural catheter since removed. Weightbearing as tolerated, bilateral lower extremities.  Coumadin for DVT prophylaxis x4 weeks and then begin aspirin therapy 81 mg.  The patient remained on subcutaneous Lovenox to INR greater than 2.00. Acute blood loss anemia of 10 and monitored.  Physical and occupational therapy ongoing.  The patient was admitted for comprehensive rehab program.  PAST MEDICAL HISTORY:  See discharge diagnoses.  SOCIAL HISTORY:  Lives with spouse.  FUNCTIONAL HISTORY:  Prior to admission independent, functional status upon admission to Rehab Services was +2 total assist to ambulate 24 feet with a rolling walker.  PHYSICAL EXAMINATION:  VITAL SIGNS:  Blood pressure 112/68, pulse 97, temperature 99, respirations 18. GENERAL:  This was an alert male, oriented x3.  Pupils were round and reactive to light. LUNGS:  Clear to auscultation. CARDIAC:  Regular rate and rhythm. ABDOMEN:   Soft, nontender.  Good bowel sounds. EXTREMITIES:  Bilateral knee incisions were dressed and appropriately tender.  REHABILITATION HOSPITAL COURSE:  The patient was admitted to Inpatient Rehab Services with therapies initiated on a 3-hour daily basis consisting of physical therapy, occupational therapy, and rehabilitation nursing.  The following issues were addressed during the patient's rehabilitation stay.  Pertaining to Nathan Ramirez's bilateral total knee arthroplasty, surgical site is healing nicely.  He would follow up with Orthopedic Services, Dr. Lequita Halt.  He was weightbearing as tolerated. Neurovascular sensation intact.  He remained on Coumadin for DVT prophylaxis until December 31, 2012, and then would begin aspirin therapy 81 mg daily.  Venous Doppler studies were negative.  A Home Health nurse did arrange for Coumadin therapy.  Pain management with the use of oxycodone and Robaxin with good results.  Acute blood loss anemia, stable.  No bleeding episodes.  Latest hemoglobin of 8.6.  He remained on Prevacid for GERD.  Note, the patient did have some mild elevated LFTs, Gastroenterology was consulted, did show some sludge in the gallbladder.  No additional inflammatory findings.  The patient will continue to be followed as outpatient.  The patient received weekly collaborative interdisciplinary team conferences to discuss estimated length of stay, family teaching, and any barriers to discharge.  He is ambulating greater than 150 feet with the rolling walker, up and down 2- 3 steps.  He is able to  complete his activities of daily living, needing some assistance for lower body dressing.  Full family teaching was completed and plan was to be discharged to home.  DISCHARGE MEDICATIONS:  Included Bentyl 10 mg p.o. t.i.d., Colace 100 mg b.i.d., Prevacid 15 mg p.o. daily, Robaxin 500 mg p.o. every 6 hours as needed for muscle spasms, Dulera 2 puffs twice daily, oxycodone immediate  release 5-20 mg every 3 hours as needed pain, Coumadin latest dose of 5 mg for DVT prophylaxis to be completed on December 31, 2012.  SPECIAL INSTRUCTIONS:  Weightbearing as tolerated.  Home health nurse to complete Coumadin therapy.  Special instructions are once Coumadin completed on December 31, 2012, begin aspirin therapy 81 mg daily.  The patient will follow up with Dr. Claudette Laws, [facility] as [facility].  Dr. Ollen Gross, Orthopedic Services, 2 weeks call for appointment.  Dr. Claudette Head, Gastroenterology Services, call for appointment during workup of LFTs.     Mariam Dollar, P.A.   ______________________________ Erick Colace, M.D.    DA/MEDQ  D:  12/12/2012  T:  12/12/2012  Job:  [id number]  cc:   Ollen Gross, M.D. Duncan Dull, M.D.

## 2012-12-12 NOTE — Progress Notes (Signed)
Subjective/Complaints: No abdominal complaints Appreciate gastroenterology consultation, no further inpt w/u unless abdominal pain occurs, follow up as outpatient Doing well with bilateral knee range of motion. Tolerated CPM to 90 yesterday  Objective: Vital Signs: Blood pressure 103/68, pulse 85, temperature 98 F (36.7 C), temperature source Oral, resp. rate 16, height 5\' 9"  (1.753 m), weight 104.01 kg (229 lb 4.8 oz), SpO2 96.00%. No results found. Results for orders placed during the hospital encounter of 12/06/12 (from the past 72 hour(s))  PROTIME-INR     Status: Abnormal   Collection Time    12/10/12  6:43 AM      Result Value Range   Prothrombin Time 21.2 (*) 11.6 - 15.2 seconds   INR 1.90 (*) 0.00 - 1.49  HEPATIC FUNCTION PANEL     Status: Abnormal   Collection Time    12/10/12 12:45 PM      Result Value Range   Total Protein 6.1  6.0 - 8.3 g/dL   Albumin 2.7 (*) 3.5 - 5.2 g/dL   AST 811 (*) 0 - 37 U/L   ALT 254 (*) 0 - 53 U/L   Alkaline Phosphatase 256 (*) 39 - 117 U/L   Total Bilirubin 1.4 (*) 0.3 - 1.2 mg/dL   Bilirubin, Direct 0.5 (*) 0.0 - 0.3 mg/dL   Indirect Bilirubin 0.9  0.3 - 0.9 mg/dL  FERRITIN     Status: Abnormal   Collection Time    12/10/12 12:45 PM      Result Value Range   Ferritin 1598 (*) 22 - 322 ng/mL   Comment: Performed at [facility]  IRON AND TIBC     Status: Abnormal   Collection Time    12/10/12 12:45 PM      Result Value Range   Iron 24 (*) 42 - 135 ug/dL   TIBC 914 (*) 782 - 956 ug/dL   Saturation Ratios 14 (*) 20 - 55 %   UIBC 145  125 - 400 ug/dL   Comment: Performed at [facility]  PROTIME-INR     Status: Abnormal   Collection Time    12/11/12  5:15 AM      Result Value Range   Prothrombin Time 22.1 (*) 11.6 - 15.2 seconds   INR 2.00 (*) 0.00 - 1.49        Assessment/Plan: 1. Functional deficits secondary to Bilateral end stage DJD of knees s/p Bilat TKR which require 3+ hours per day of  interdisciplinary therapy in a comprehensive inpatient rehab setting. Physiatrist is providing close team supervision and 24 hour management of active medical problems listed below. Physiatrist and rehab team continue to assess barriers to discharge/monitor patient progress toward functional and medical goals. FIM: FIM - Bathing Bathing Steps Patient Completed: Chest;Right Arm;Left Arm;Abdomen;Left upper leg;Right upper leg;Buttocks;Front perineal area;Right lower leg (including foot);Left lower leg (including foot) Bathing: 6: More than reasonable amount of time  FIM - Upper Body Dressing/Undressing Upper body dressing/undressing steps patient completed: Thread/unthread right sleeve of pullover shirt/dresss;Thread/unthread left sleeve of pullover shirt/dress;Put head through opening of pull over shirt/dress;Pull shirt over trunk Upper body dressing/undressing: 7: Complete Independence: No helper FIM - Lower Body Dressing/Undressing Lower body dressing/undressing steps patient completed: Thread/unthread right underwear leg;Thread/unthread left underwear leg;Pull underwear up/down;Thread/unthread right pants leg;Thread/unthread left pants leg;Pull pants up/down;Don/Doff right shoe;Don/Doff left shoe Lower body dressing/undressing: 6: More than reasonable amount of time  FIM - Toileting Toileting steps completed by patient: Adjust clothing prior to toileting;Performs perineal hygiene;Adjust clothing after  toileting Toileting: 6: More than reasonable amount of time  FIM - Diplomatic Services operational officer Devices: Elevated toilet seat;Walker Toilet Transfers: 6-More than reasonable amt of time  FIM - Banker Devices: Therapist, occupational: 6: Assistive device: no helper;6: More than reasonable amt of time  FIM - Locomotion: Wheelchair Distance: 150 Locomotion: Wheelchair: 0: Activity did not occur FIM - Locomotion:  Ambulation Locomotion: Ambulation Assistive Devices: Designer, industrial/product Ambulation/Gait Assistance: 6: Modified independent (Device/Increase time) Locomotion: Ambulation: 6: Travels 150 ft or more with assistive device/no helper  Comprehension Comprehension Mode: Auditory Comprehension: 7-Follows complex conversation/direction: With no assist  Expression Expression Mode: Verbal Expression: 7-Expresses complex ideas: With no assist  Social Interaction Social Interaction: 7-Interacts appropriately with others - No medications needed.  Problem Solving Problem Solving: 7-Solves complex problems: Recognizes & self-corrects  Memory Memory: 7-Complete Independence: No helper  Medical Problem List and Plan:  1. 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 DVT Prophylaxis/Anticoagulation: Therapeutic Coumadin for DVT prophylaxis. One month prophyllaxis, discontinue Lovenox when INR greater than 2.00.  3. Pain Management: Oxycodone and Robaxin as needed. Monitor with increased mobility  4. Neuropsych: This patient is capable of making decisions on his own behalf.  5. Acute blood loss anemia. Followup CBC 11/29 6. GERD. Prevacid  7. Asthma.Dulera 2 puffs twice a day. No complaints of shortness of breath  8. Irritable bowel syndrome. Bentyl 10 mg 3 times a day.Will hold and give dulc supp  9.  Elevated LFT asymptomatic D/c ed  Acetaminophen but still going up.  No obvious meds, negative abdominal ultrasound, negative hepatitis panel outpt f/u with GI    LOS (Days) 6 A FACE TO FACE EVALUATION WAS PERFORMED  Nathan Ramirez E 12/12/2012, 10:35 AM

## 2012-12-12 NOTE — Discharge Summary (Signed)
Discharge summary job # [id number]

## 2012-12-12 NOTE — Progress Notes (Signed)
ANTICOAGULATION CONSULT NOTE - Follow Up Consult  Pharmacy Consult for Warfarin Indication: VTE prophylaxis s/p Bilateral TKA  No Known Allergies  Patient Measurements: Height: 5\' 9"  (175.3 cm) Weight: 229 lb 4.8 oz (104.01 kg) IBW/kg (Calculated) : 70.7  Vital Signs: Temp: 98 F (36.7 C) (11/30 0625) Temp src: Oral (11/30 0625) BP: 103/68 mmHg (11/30 0625) Pulse Rate: 85 (11/30 0625)  Labs:  Recent Labs  12/10/12 0643 12/11/12 0515 12/12/12 1045  LABPROT 21.2* 22.1* 24.0*  INR 1.90* 2.00* 2.23*    Estimated Creatinine Clearance: 92.1 ml/min (by C-G formula based on Cr of 0.95).  Assessment: 67 yo s/p bilateral TKA on 11/20, warfarin started for post-op VTE prophylaxis. INR 2.23 (therapeutic). No bleeding noted. Noted plan for 4 weeks of coumadin per ortho.  Goal of Therapy:  INR 2-3   Plan: Warfarin 3 mg po today.  Daily PT/INR  Christoper Fabian, PharmD, BCPS Clinical pharmacist, pager [phone] 12/12/2012  1:39 PM

## 2012-12-13 DIAGNOSIS — M1710 Unilateral primary osteoarthritis, unspecified knee: Secondary | ICD-10-CM

## 2012-12-13 DIAGNOSIS — Z96659 Presence of unspecified artificial knee joint: Secondary | ICD-10-CM

## 2012-12-13 LAB — ANA: Anti Nuclear Antibody(ANA): NEGATIVE

## 2012-12-13 LAB — MITOCHONDRIAL ANTIBODIES: Mitochondrial M2 Ab, IgG: 0.15 (ref <0.91)

## 2012-12-13 LAB — PROTIME-INR
INR: 2.04 — ABNORMAL HIGH (ref 0.00–1.49)
Prothrombin Time: 22.4 seconds — ABNORMAL HIGH (ref 11.6–15.2)

## 2012-12-13 MED ORDER — WARFARIN SODIUM 4 MG PO TABS
4.0000 mg | ORAL_TABLET | Freq: Once | ORAL | Status: DC
  Filled 2012-12-13: qty 1

## 2012-12-13 MED ORDER — OXYCODONE HCL 5 MG PO TABS: 5.0000 mg | ORAL_TABLET | ORAL | Status: DC | PRN

## 2012-12-13 MED ORDER — DICYCLOMINE HCL 10 MG PO CAPS: 10.0000 mg | ORAL_CAPSULE | Freq: Three times a day (TID) | ORAL | Status: DC

## 2012-12-13 MED ORDER — LANSOPRAZOLE 15 MG PO CPDR: 15.0000 mg | DELAYED_RELEASE_CAPSULE | Freq: Every day | ORAL | Status: DC

## 2012-12-13 MED ORDER — WARFARIN SODIUM 4 MG PO TABS: ORAL_TABLET | ORAL | Status: DC

## 2012-12-13 MED ORDER — METHOCARBAMOL 500 MG PO TABS: 500.0000 mg | ORAL_TABLET | Freq: Four times a day (QID) | ORAL | Status: DC | PRN

## 2012-12-13 NOTE — Progress Notes (Signed)
Occupational Therapy Discharge Summary  Patient Details  Name: Nathan Ramirez MRN: [id number] Date of Birth: December 11, 1945  Today's Date: 12/13/2012    Patient has met 8 of 8 long term goals due to improved activity tolerance, improved balance and ability to compensate for deficits.  Patient to discharge at overall Modified Independent level.  Patient's care partner is independent to provide the necessary physical assistance at discharge.    Reasons goals not met:n/a  Recommendation:  HHOT recommended to address IADL independence with homemaking, taking care of his animals.  Equipment: BSC  Reasons for discharge: treatment goals met  Patient/family agrees with progress made and goals achieved: Yes  OT Discharge ADL  mod I Vision/Perception  Vision - History Baseline Vision: Wears glasses all the time Patient Visual Report: No change from baseline Perception Perception: Within Functional Limits Praxis Praxis: Intact  Cognition Overall Cognitive Status: Within Functional Limits for tasks assessed Sensation Sensation Light Touch: Appears Intact Stereognosis: Appears Intact Hot/Cold: Appears Intact Proprioception: Appears Intact Coordination Gross Motor Movements are Fluid and Coordinated: Yes Fine Motor Movements are Fluid and Coordinated: Yes Motor  Motor Motor: Within Functional Limits Mobility    mod I with ADL transfers Trunk/Postural Assessment  Cervical Assessment Cervical Assessment: Within Functional Limits Thoracic Assessment Thoracic Assessment: Within Functional Limits Lumbar Assessment Lumbar Assessment: Within Functional Limits Postural Control Postural Control: Within Functional Limits  Balance Dynamic Sitting Balance Dynamic Sitting - Level of Assistance: 7: Independent Static Standing Balance Static Standing - Level of Assistance: 6: Modified independent (Device/Increase time) Dynamic Standing Balance Dynamic Standing - Level of Assistance: 6:  Modified independent (Device/Increase time) Extremity/Trunk Assessment RUE Assessment RUE Assessment: Within Functional Limits LUE Assessment LUE Assessment: Within Functional Limits  See FIM for current functional status  [name] 12/13/2012, 8:19 AM

## 2012-12-13 NOTE — Progress Notes (Signed)
Pt discharge home with wife. Discharge instructions provided by Harvel Ricks, PA. All questions answered, pt verbalized understanding. Pt escorted off unit with personal belonging in w/c by [place], NT.

## 2012-12-13 NOTE — Progress Notes (Signed)
ANTICOAGULATION CONSULT NOTE - Follow Up Consult  Pharmacy Consult for coumadin Indication: VTE prophylaxis  No Known Allergies  Patient Measurements: Height: 5\' 9"  (175.3 cm) Weight: 229 lb 4.8 oz (104.01 kg) IBW/kg (Calculated) : 70.7 Heparin Dosing Weight:   Vital Signs: Temp: 98.6 F (37 C) (12/01 0434) Temp src: Oral (12/01 0434) BP: 101/66 mmHg (12/01 0434) Pulse Rate: 93 (12/01 0434)  Labs:  Recent Labs  12/11/12 0515 12/12/12 1045 12/13/12 0740  LABPROT 22.1* 24.0* 22.4*  INR 2.00* 2.23* 2.04*    Estimated Creatinine Clearance: 92.1 ml/min (by C-G formula based on Cr of 0.95).   Medications:  Scheduled:  . bisacodyl  10 mg Rectal Once  . dicyclomine  10 mg Oral TID AC & HS  . docusate sodium  100 mg Oral BID  . lansoprazole  15 mg Oral Q1200  . mometasone-formoterol  2 puff Inhalation BID  . Warfarin - Pharmacist Dosing Inpatient   Does not apply q1800   Infusions:    Assessment: 67 yo male s/p TKA is currently on therapeutic coumadin.  INR is 2.04 today Goal of Therapy:  INR 2-3 Monitor platelets by anticoagulation protocol: Yes   Plan:  1) Coumadin 4mg  po x1 today.  Patient probably requires b/w 4 to 5 mg daily of coumadin 2) If get discharged today, rec to continue 4mg  po daily and recheck INR on Wednesday 12/03 to reassess dosing.  Sparkle Aube, Tsz-Yin 12/13/2012,8:26 AM

## 2012-12-13 NOTE — Progress Notes (Signed)
Subjective/Complaints: Modified independent in room. Took shower by himself today. Appreciate gastroenterology consultation, no further inpt w/u unless abdominal pain occurs, follow up as outpatient Doing well with bilateral knee range of motion.  Objective: Vital Signs: Blood pressure 101/66, pulse 93, temperature 98.6 F (37 C), temperature source Oral, resp. rate 18, height 5\' 9"  (1.753 m), weight 104.01 kg (229 lb 4.8 oz), SpO2 96.00%. No results found. Results for orders placed during the hospital encounter of 12/06/12 (from the past 72 hour(s))  HEPATIC FUNCTION PANEL     Status: Abnormal   Collection Time    12/10/12 12:45 PM      Result Value Range   Total Protein 6.1  6.0 - 8.3 g/dL   Albumin 2.7 (*) 3.5 - 5.2 g/dL   AST 782 (*) 0 - 37 U/L   ALT 254 (*) 0 - 53 U/L   Alkaline Phosphatase 256 (*) 39 - 117 U/L   Total Bilirubin 1.4 (*) 0.3 - 1.2 mg/dL   Bilirubin, Direct 0.5 (*) 0.0 - 0.3 mg/dL   Indirect Bilirubin 0.9  0.3 - 0.9 mg/dL  FERRITIN     Status: Abnormal   Collection Time    12/10/12 12:45 PM      Result Value Range   Ferritin 1598 (*) 22 - 322 ng/mL   Comment: Performed at [facility]  IRON AND TIBC     Status: Abnormal   Collection Time    12/10/12 12:45 PM      Result Value Range   Iron 24 (*) 42 - 135 ug/dL   TIBC 956 (*) 213 - 086 ug/dL   Saturation Ratios 14 (*) 20 - 55 %   UIBC 145  125 - 400 ug/dL   Comment: Performed at [facility]  PROTIME-INR     Status: Abnormal   Collection Time    12/11/12  5:15 AM      Result Value Range   Prothrombin Time 22.1 (*) 11.6 - 15.2 seconds   INR 2.00 (*) 0.00 - 1.49  PROTIME-INR     Status: Abnormal   Collection Time    12/12/12 10:45 AM      Result Value Range   Prothrombin Time 24.0 (*) 11.6 - 15.2 seconds   INR 2.23 (*) 0.00 - 1.49  PROTIME-INR     Status: Abnormal   Collection Time    12/13/12  7:40 AM      Result Value Range   Prothrombin Time 22.4 (*) 11.6 - 15.2 seconds   INR  2.04 (*) 0.00 - 1.49        Assessment/Plan: 1. 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 Physiatrist is providing close team supervision and 24 hour management of active medical problems listed below. Physiatrist and rehab team continue to assess barriers to discharge/monitor patient progress toward functional and medical goals. Stable for D/C today F/u PCP in 1-2 weeks F/u [facility] F/u GI See D/C summary See D/C instructions FIM: FIM - Bathing Bathing Steps Patient Completed: Chest;Right Arm;Left Arm;Abdomen;Left upper leg;Right upper leg;Buttocks;Front perineal area;Right lower leg (including foot);Left lower leg (including foot) Bathing: 6: More than reasonable amount of time  FIM - Upper Body Dressing/Undressing Upper body dressing/undressing steps patient completed: Thread/unthread right sleeve of pullover shirt/dresss;Thread/unthread left sleeve of pullover shirt/dress;Put head through opening of pull over shirt/dress;Pull shirt over trunk Upper body dressing/undressing: 7:  Complete Independence: No helper FIM - Lower Body Dressing/Undressing Lower body dressing/undressing steps patient completed: Thread/unthread right underwear leg;Thread/unthread left underwear leg;Pull underwear up/down;Thread/unthread right pants leg;Thread/unthread left pants leg;Pull pants up/down;Don/Doff right shoe;Don/Doff left shoe Lower body dressing/undressing: 6: More than reasonable amount of time  FIM - Toileting Toileting steps completed by patient: Adjust clothing prior to toileting;Performs perineal hygiene;Adjust clothing after toileting Toileting: 6: More than reasonable amount of time  FIM - Diplomatic Services operational officer Devices: Elevated toilet seat;Walker Toilet Transfers: 6-More than reasonable amt of time  FIM - Physiological scientist Devices: Therapist, occupational: 6: Assistive device: no helper;6: More than reasonable amt of time  FIM - Locomotion: Wheelchair Distance: 150 Locomotion: Wheelchair: 0: Activity did not occur FIM - Locomotion: Ambulation Locomotion: Ambulation Assistive Devices: Designer, industrial/product Ambulation/Gait Assistance: 6: Modified independent (Device/Increase time) Locomotion: Ambulation: 6: Travels 150 ft or more with assistive device/no helper  Comprehension Comprehension Mode: Auditory Comprehension: 7-Follows complex conversation/direction: With no assist  Expression Expression Mode: Verbal Expression: 7-Expresses complex ideas: With no assist  Social Interaction Social Interaction: 7-Interacts appropriately with others - No medications needed.  Problem Solving Problem Solving: 7-Solves complex problems: Recognizes & self-corrects  Memory Memory: 7-Complete Independence: No helper  Medical Problem List and Plan:  1. 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 DVT Prophylaxis/Anticoagulation: Therapeutic Coumadin for DVT prophylaxis. One month prophyllaxis, discontinue Lovenox when INR greater than 2.00.  3. Pain Management: Oxycodone and Robaxin as needed. Monitor with increased mobility  4. Neuropsych: This patient is capable of making decisions on his own behalf.  5. Acute blood loss anemia. Followup CBC 11/29 6. GERD. Prevacid  7. Asthma.Dulera 2 puffs twice a day. No complaints of shortness of breath  8. Irritable bowel syndrome. Bentyl 10 mg 3 times a day.Will hold and give dulc supp  9.  Elevated LFT asymptomatic D/c ed  Acetaminophen but still going up.  No obvious meds, negative abdominal ultrasound, negative hepatitis panel outpt f/u with GI    LOS (Days) 7 A FACE TO FACE EVALUATION WAS PERFORMED  Husna Krone E 12/13/2012, 8:16 AM

## 2012-12-13 NOTE — Progress Notes (Signed)
Social Work Discharge Note Discharge Note  The overall goal for the admission was met for:   Discharge location: Yes-HOME WITH WIFE WHO CAN PROVIDE SUPERVISION LEVEL  Length of Stay: Yes-7 DAYS  Discharge activity level: Yes-MOD/I LEVEL  Home/community participation: Yes  Services provided included: MD, RD, PT, OT, RN, Pharmacy and SW  Financial Services: Medicare and Private Insurance: [employer]  Follow-up services arranged: Home Health: [facility]-PT,OT,RN, DME: [facility], [facility] and Patient/Family request agency HH: [facility], DME: [facility]  Comments (or additional information):PT DID WELL AND PREPARED TO GO HOME  Patient/Family verbalized understanding of follow-up arrangements: Yes  Individual responsible for coordination of the follow-up plan: SELF & MARSHA-WIFE  Confirmed correct DME delivered: Lucy Chris 12/13/2012    Lucy Chris

## 2012-12-13 NOTE — Progress Notes (Signed)
Physical Therapy Discharge Summary  Patient Details  Name: Nathan Ramirez MRN: [id number] Date of Birth: 1945-05-26  Today's Date: 12/13/2012  Patient has met 8 of 8 long term goals due to improved activity tolerance, improved balance, improved postural control, increased strength, increased range of motion, decreased pain and ability to compensate for deficits.  Patient to discharge at an ambulatory level Modified Independent on level terrain; Supervision for stairs and in community.   Patient's care partner is independent to provide the necessary cognitive assistance at discharge.  Reasons goals not met: n/a  Recommendation:  Patient will benefit from ongoing skilled PT services in home health setting to continue to advance safe functional mobility, address ongoing impairments in flexibility, strength, balance, pain, activity tolerance, and minimize fall risk. PTA, pt took care of his horses and barn; he is very motivated to return to these activities, as well as riding, and may need extensive rehab including OPPT to return to this level.  Equipment: RW  Reasons for discharge: treatment goals met and discharge from hospital  Patient/family agrees with progress made and goals achieved: Yes  PT Discharge Precautions/Restrictions Precautions Precautions: Fall Precaution Comments: no longer using KIs Restrictions Weight Bearing Restrictions: No   Pain Pain Assessment Pain Score: 5  Pain Type: Surgical pain Pain Location: Knee Pain Orientation: Right;Left Pain Intervention(s): Medication (See eMAR) Vision/Perception  Vision - History Baseline Vision: Wears glasses all the time Patient Visual Report: No change from baseline Perception Perception: Within Functional Limits Praxis Praxis: Intact  Cognition Overall Cognitive Status: Within Functional Limits for tasks assessed Arousal/Alertness: Awake/alert Orientation Level: Oriented X4 Safety/Judgment: Appears  intact Sensation Sensation Light Touch: Appears Intact Proprioception: Appears Intact Coordination Gross Motor Movements are Fluid and Coordinated: Yes Heel Shin Test: NT Motor  Motor Motor: Within Functional Limits  Mobility Bed Mobility Bed Mobility:  (modified independent for all) Transfers Transfers: Yes Stand Pivot Transfers: 6: Modified independent (Device/Increase time) Locomotion  Ambulation Ambulation: Yes Ambulation/Gait Assistance: 6: Modified independent (Device/Increase time) Ambulation Distance (Feet): 200 Feet Assistive device: Rolling walker Gait Gait: Yes Gait Pattern: Impaired Gait Pattern: Decreased hip/knee flexion - left;Decreased hip/knee flexion - right;Decreased step length - right;Decreased step length - left;Antalgic;Decreased trunk rotation Gait velocity: decreased High Level Ambulation High Level Ambulation: Side stepping;Backwards walking;Direction changes Stairs / Additional Locomotion Stairs: Yes Stairs Assistance: 5: Supervision Stair Management Technique: One rail Right Number of Stairs: 5 Curb: 5: Supervision Wheelchair Mobility Wheelchair Mobility: No  Trunk/Postural Assessment  Cervical Assessment Cervical Assessment: Within Functional Limits Thoracic Assessment Thoracic Assessment: Within Functional Limits Lumbar Assessment Lumbar Assessment: Within Functional Limits Postural Control Postural Control: Within Functional Limits  Balance Balance Balance Assessed: Yes Dynamic Sitting Balance Dynamic Sitting - Level of Assistance: 7: Independent Static Standing Balance Static Standing - Balance Support: Bilateral upper extremity supported;During functional activity Static Standing - Level of Assistance: 6: Modified independent (Device/Increase time) Dynamic Standing Balance Dynamic Standing - Level of Assistance: 6: Modified independent (Device/Increase time) Extremity Assessment      RLE Assessment RLE Assessment:  Exceptions to [facility] RLE PROM (degrees) RLE Overall PROM Comments:  knee : 95 degrees flexion in sittiing RLE Strength RLE Overall Strength Comments: NT; WFL for gait with AD modified independent LLE Assessment LLE Assessment: Exceptions to WFL LLE PROM (degrees) LLE Overall PROM Comments: knee 95 degrees flexion in sitting LLE Strength LLE Overall Strength Comments: NT; WFL with AD for gait modified indepdnent  See FIM for current functional status  Nathan Ramirez 12/13/2012, 1:56 PM

## 2012-12-14 DIAGNOSIS — J449 Chronic obstructive pulmonary disease, unspecified: Secondary | ICD-10-CM | POA: Diagnosis not present

## 2012-12-14 DIAGNOSIS — K219 Gastro-esophageal reflux disease without esophagitis: Secondary | ICD-10-CM | POA: Diagnosis not present

## 2012-12-14 DIAGNOSIS — Z471 Aftercare following joint replacement surgery: Secondary | ICD-10-CM | POA: Diagnosis not present

## 2012-12-14 DIAGNOSIS — M1990 Unspecified osteoarthritis, unspecified site: Secondary | ICD-10-CM | POA: Diagnosis not present

## 2012-12-14 DIAGNOSIS — Z96659 Presence of unspecified artificial knee joint: Secondary | ICD-10-CM | POA: Diagnosis not present

## 2012-12-15 ENCOUNTER — Encounter: Payer: Self-pay | Admitting: *Deleted

## 2012-12-15 DIAGNOSIS — Z471 Aftercare following joint replacement surgery: Secondary | ICD-10-CM | POA: Diagnosis not present

## 2012-12-15 DIAGNOSIS — M1990 Unspecified osteoarthritis, unspecified site: Secondary | ICD-10-CM | POA: Diagnosis not present

## 2012-12-15 DIAGNOSIS — K219 Gastro-esophageal reflux disease without esophagitis: Secondary | ICD-10-CM | POA: Diagnosis not present

## 2012-12-15 DIAGNOSIS — J449 Chronic obstructive pulmonary disease, unspecified: Secondary | ICD-10-CM | POA: Diagnosis not present

## 2012-12-15 DIAGNOSIS — Z96659 Presence of unspecified artificial knee joint: Secondary | ICD-10-CM | POA: Diagnosis not present

## 2012-12-15 LAB — ANTI-SMOOTH MUSCLE ANTIBODY, IGG: F-Actin IgG: 8 U (ref <20)

## 2012-12-15 LAB — CULTURE, FUNGUS WITHOUT SMEAR

## 2012-12-16 LAB — ALPHA-1 ANTITRYPSIN PHENOTYPE: A-1 Antitrypsin: 214 mg/dL — ABNORMAL HIGH (ref 83–199)

## 2012-12-17 DIAGNOSIS — Z471 Aftercare following joint replacement surgery: Secondary | ICD-10-CM | POA: Diagnosis not present

## 2012-12-17 DIAGNOSIS — M1990 Unspecified osteoarthritis, unspecified site: Secondary | ICD-10-CM | POA: Diagnosis not present

## 2012-12-17 DIAGNOSIS — J449 Chronic obstructive pulmonary disease, unspecified: Secondary | ICD-10-CM | POA: Diagnosis not present

## 2012-12-17 DIAGNOSIS — Z96659 Presence of unspecified artificial knee joint: Secondary | ICD-10-CM | POA: Diagnosis not present

## 2012-12-17 DIAGNOSIS — K219 Gastro-esophageal reflux disease without esophagitis: Secondary | ICD-10-CM | POA: Diagnosis not present

## 2012-12-20 DIAGNOSIS — J449 Chronic obstructive pulmonary disease, unspecified: Secondary | ICD-10-CM | POA: Diagnosis not present

## 2012-12-20 DIAGNOSIS — Z471 Aftercare following joint replacement surgery: Secondary | ICD-10-CM | POA: Diagnosis not present

## 2012-12-20 DIAGNOSIS — Z96659 Presence of unspecified artificial knee joint: Secondary | ICD-10-CM | POA: Diagnosis not present

## 2012-12-20 DIAGNOSIS — M1990 Unspecified osteoarthritis, unspecified site: Secondary | ICD-10-CM | POA: Diagnosis not present

## 2012-12-20 DIAGNOSIS — K219 Gastro-esophageal reflux disease without esophagitis: Secondary | ICD-10-CM | POA: Diagnosis not present

## 2012-12-21 DIAGNOSIS — Z96659 Presence of unspecified artificial knee joint: Secondary | ICD-10-CM | POA: Diagnosis not present

## 2012-12-21 DIAGNOSIS — Z471 Aftercare following joint replacement surgery: Secondary | ICD-10-CM | POA: Diagnosis not present

## 2012-12-21 DIAGNOSIS — J449 Chronic obstructive pulmonary disease, unspecified: Secondary | ICD-10-CM | POA: Diagnosis not present

## 2012-12-21 DIAGNOSIS — M1990 Unspecified osteoarthritis, unspecified site: Secondary | ICD-10-CM | POA: Diagnosis not present

## 2012-12-21 DIAGNOSIS — K219 Gastro-esophageal reflux disease without esophagitis: Secondary | ICD-10-CM | POA: Diagnosis not present

## 2012-12-22 DIAGNOSIS — J449 Chronic obstructive pulmonary disease, unspecified: Secondary | ICD-10-CM | POA: Diagnosis not present

## 2012-12-22 DIAGNOSIS — Z471 Aftercare following joint replacement surgery: Secondary | ICD-10-CM | POA: Diagnosis not present

## 2012-12-22 DIAGNOSIS — Z96659 Presence of unspecified artificial knee joint: Secondary | ICD-10-CM | POA: Diagnosis not present

## 2012-12-22 DIAGNOSIS — K219 Gastro-esophageal reflux disease without esophagitis: Secondary | ICD-10-CM | POA: Diagnosis not present

## 2012-12-22 DIAGNOSIS — M1990 Unspecified osteoarthritis, unspecified site: Secondary | ICD-10-CM | POA: Diagnosis not present

## 2012-12-24 ENCOUNTER — Telehealth: Payer: Self-pay | Admitting: Internal Medicine

## 2012-12-24 DIAGNOSIS — Z471 Aftercare following joint replacement surgery: Secondary | ICD-10-CM | POA: Diagnosis not present

## 2012-12-24 DIAGNOSIS — J449 Chronic obstructive pulmonary disease, unspecified: Secondary | ICD-10-CM | POA: Diagnosis not present

## 2012-12-24 DIAGNOSIS — K219 Gastro-esophageal reflux disease without esophagitis: Secondary | ICD-10-CM | POA: Diagnosis not present

## 2012-12-24 DIAGNOSIS — Z7901 Long term (current) use of anticoagulants: Secondary | ICD-10-CM

## 2012-12-24 DIAGNOSIS — M1990 Unspecified osteoarthritis, unspecified site: Secondary | ICD-10-CM | POA: Diagnosis not present

## 2012-12-24 DIAGNOSIS — Z96659 Presence of unspecified artificial knee joint: Secondary | ICD-10-CM | POA: Diagnosis not present

## 2012-12-24 NOTE — Telephone Encounter (Signed)
Patient scheduled for Monday.

## 2012-12-24 NOTE — Telephone Encounter (Signed)
Patient is to discontinue therapy after next week he just wanted to know since he was released by therapy where he was having his PT INR checked should he have it checked here until he has completed therapy with the coumadin next week.

## 2012-12-24 NOTE — Telephone Encounter (Signed)
Pt just had both knees replaced and was put on coumadin 4 mg tablets. He just had it checked it was INR 2.5 and PT 21.4. He was wanting to know if he should start coming to have that checked. He only has 5 pills left he will be out on the 01/02/13. Please advise

## 2012-12-24 NOTE — Telephone Encounter (Signed)
Please advise 

## 2012-12-24 NOTE — Telephone Encounter (Signed)
Yes he can come here to have his coumadin level re checked.  i will put in order for Monday.  Give him a lab appt

## 2012-12-24 NOTE — Telephone Encounter (Signed)
I am not sure what to tell  Him,.  His orthopedist should be telling him how long he is supposed to be taking the coumadin,  I do not decide that.

## 2012-12-27 ENCOUNTER — Other Ambulatory Visit (INDEPENDENT_AMBULATORY_CARE_PROVIDER_SITE_OTHER): Payer: Medicare Other

## 2012-12-27 ENCOUNTER — Telehealth: Payer: Self-pay | Admitting: Internal Medicine

## 2012-12-27 DIAGNOSIS — Z7901 Long term (current) use of anticoagulants: Secondary | ICD-10-CM

## 2012-12-27 LAB — PROTIME-INR
INR: 2.1 ratio — ABNORMAL HIGH (ref 0.8–1.0)
Prothrombin Time: 22.2 s — ABNORMAL HIGH (ref 10.2–12.4)

## 2012-12-27 NOTE — Telephone Encounter (Signed)
Whichever one is managing his pain .  Oxycodone was filled on 12/1 and unless it is too strong , I would use that as needed.

## 2012-12-27 NOTE — Telephone Encounter (Signed)
Has hydrocodone-acetaminophen 7.5/750 on hand.  Asking if Nathan Ramirez can take this medication.  Asking for a call.

## 2012-12-27 NOTE — Telephone Encounter (Signed)
Oxycodone filled 12/13/12  Please advise which you would rather patient take for Knee pain post surgery.

## 2012-12-27 NOTE — Telephone Encounter (Signed)
Left message pt to call if any questions 

## 2012-12-28 DIAGNOSIS — M25569 Pain in unspecified knee: Secondary | ICD-10-CM | POA: Diagnosis not present

## 2012-12-30 ENCOUNTER — Other Ambulatory Visit: Payer: Self-pay | Admitting: Internal Medicine

## 2012-12-30 MED ORDER — METHOCARBAMOL 500 MG PO TABS: 500.0000 mg | ORAL_TABLET | Freq: Four times a day (QID) | ORAL | Status: DC | PRN

## 2012-12-30 MED ORDER — HYDROCODONE-ACETAMINOPHEN 5-325 MG PO TABS: 1.0000 | ORAL_TABLET | Freq: Four times a day (QID) | ORAL | Status: DC | PRN

## 2012-12-30 MED ORDER — METHOCARBAMOL 500 MG PO TABS: 500.0000 mg | ORAL_TABLET | Freq: Four times a day (QID) | ORAL | Status: DC

## 2012-12-31 DIAGNOSIS — M25569 Pain in unspecified knee: Secondary | ICD-10-CM | POA: Diagnosis not present

## 2013-01-04 DIAGNOSIS — M25569 Pain in unspecified knee: Secondary | ICD-10-CM | POA: Diagnosis not present

## 2013-01-10 ENCOUNTER — Other Ambulatory Visit: Payer: Self-pay | Admitting: Internal Medicine

## 2013-01-10 DIAGNOSIS — M25569 Pain in unspecified knee: Secondary | ICD-10-CM | POA: Diagnosis not present

## 2013-01-11 DIAGNOSIS — M25569 Pain in unspecified knee: Secondary | ICD-10-CM | POA: Diagnosis not present

## 2013-01-11 DIAGNOSIS — Z96659 Presence of unspecified artificial knee joint: Secondary | ICD-10-CM | POA: Diagnosis not present

## 2013-01-11 DIAGNOSIS — Z471 Aftercare following joint replacement surgery: Secondary | ICD-10-CM | POA: Diagnosis not present

## 2013-01-12 ENCOUNTER — Ambulatory Visit (INDEPENDENT_AMBULATORY_CARE_PROVIDER_SITE_OTHER): Payer: Medicare Other | Admitting: Internal Medicine

## 2013-01-12 ENCOUNTER — Encounter: Payer: Self-pay | Admitting: Internal Medicine

## 2013-01-12 VITALS — BP 95/60 | HR 84 | Temp 99.4°F | Wt 202.0 lb

## 2013-01-12 DIAGNOSIS — J069 Acute upper respiratory infection, unspecified: Secondary | ICD-10-CM

## 2013-01-12 DIAGNOSIS — E669 Obesity, unspecified: Secondary | ICD-10-CM

## 2013-01-12 MED ORDER — PREDNISONE 10 MG PO TABS: ORAL_TABLET | ORAL | Status: DC

## 2013-01-12 MED ORDER — AMOXICILLIN-POT CLAVULANATE 875-125 MG PO TABS: 1.0000 | ORAL_TABLET | Freq: Two times a day (BID) | ORAL | Status: DC

## 2013-01-12 NOTE — Patient Instructions (Addendum)
You are recovering from a viral  Syndrome .  You do not need an antibiotic at this point.  The post nasal drip is causing your sore throat and  Productive cough .  Flush your sinuses twice daly with Simply saline nasal spray or Neil's sinus rinse.   Use benadryl 25 mg every 8 hours for the drainage and Sudafed PE 10 to 30 mg  every 8 hours to manage the congestion.  Gargle with salt water as needed for  sore throat.  Use Delsym or Mucinex DM for the cough.  If you develop T > 100.4,  Green/blood streaked sputum or  nasal discharge or facial pain,  You can start the antibiotic. (augmentin)  However, Please take a probiotic ( Align, Floraque or Culturelle) while you are on the antibiotic to prevent a serious antibiotic associated diarrhea  Called clostirudium dificile colitis

## 2013-01-12 NOTE — Progress Notes (Signed)
Patient ID: Nathan Ramirez, male   DOB: [birth year], 67 y.o.   MRN: [id number]   Patient Active Problem List   Diagnosis Date Noted  . Viral URI with cough 01/13/2013  . Nonspecific abnormal results of liver function study 12/08/2012  . Elevated liver function tests 12/08/2012  . Urinary retention 12/06/2012  . Status post total bilateral knee replacement 12/06/2012  . Hyponatremia 12/03/2012  . Sinus tachycardia by electrocardiogram 12/03/2012  . Postoperative anemia due to acute blood loss 12/02/2012  . OA (osteoarthritis) of knee 12/01/2012  . Medicare welcome visit 11/19/2012  . Other and unspecified hyperlipidemia 11/17/2012  . Preoperative evaluation to rule out surgical contraindication 10/11/2012  . Irritable bowel syndrome 10/01/2012  . GERD (gastroesophageal reflux disease) 09/19/2012  . Physical exam 06/15/2012  . Hyperlipidemia LDL goal <100 11/17/2011  . Obesity (BMI 30.0-34.9) 11/17/2011  . ALLERGIC RHINITIS CAUSE UNSPECIFIED 02/20/2010  . ASTHMA 02/20/2010    Subjective:  CC:   Chief Complaint  Patient presents with  . Acute Visit    coughing productive brown mucus x2days. fatigue.     HPI:   Nathan Ramirez a 67 y.o. male who presents URI. symptoms started on Friday with sinus congestion and cough,  And has progressed and now having heaqiness in the chest and coughing up brown sputum .  Severe myalgias  yesterday. Had the flu vaccine,  Denies fever feelings.  Feeling better today   Wt loss of 27 lbs since bilateral knee replacement  one month ago .  Doing great with rehab    Past Medical History  Diagnosis Date  . Arthritis   . Chicken pox   . Allergy   . Elevated blood pressure reading   . GERD (gastroesophageal reflux disease)     never had EGD  . Hypercholesteremia     borderline  . Asthma     "allergic"  . Headache(784.0)     occasional tension  . PONV (postoperative nausea and vomiting)   . Colon polyp ~ 2012    pathology unknown,  colonoscopy at [facility] in [place].     Past Surgical History  Procedure Laterality Date  . Lumbar disc surgery  2008  . Knee arthroscopy  2009& 2010    RIGHT AND LEFT KNEE  . Appendectomy  1972  . Total knee arthroplasty Bilateral 12/01/2012    Procedure: TOTAL KNEE BILATERAL;  Surgeon: Loanne Drilling, MD;  Location: [facility]S;  Service: Orthopedics;  Laterality: Bilateral;       The following portions of the patient's history were reviewed and updated as appropriate: Allergies, current medications, and problem list.    Review of Systems:   12 Pt  review of systems was negative except those addressed in the HPI,     History   Social History  . Marital Status: Married    Spouse Name: N/A    Number of Children: N/A  . Years of Education: N/A   Occupational History  . Not on file.   Social History Main Topics  . Smoking status: Former Smoker -- 40 years    Types: Cigarettes    Quit date: 01/14/1988  . Smokeless tobacco: Never Used  . Alcohol Use: Yes     Comment: OCCASSIONALLY  . Drug Use: No  . Sexual Activity: Not on file   Other Topics Concern  . Not on file   Social History Narrative  . No narrative on file    Objective:  Filed Vitals:  01/12/13 1219  BP: 95/60  Pulse: 84  Temp: 99.4 F (37.4 C)     General appearance: alert, cooperative and appears stated age Ears: normal TM's and external ear canals both ears Throat: lips, mucosa, and tongue normal; teeth and gums normal Neck: no adenopathy, no carotid bruit, supple, symmetrical, trachea midline and thyroid not enlarged, symmetric, no tenderness/mass/nodules Back: symmetric, no curvature. ROM normal. No CVA tenderness. Lungs: clear to auscultation bilaterally Heart: regular rate and rhythm, S1, S2 normal, no murmur, click, rub or gallop Abdomen: soft, non-tender; bowel sounds normal; no masses,  no organomegaly Pulses: 2+ and symmetric Skin: Skin color, texture, turgor normal. No  rashes or lesions Lymph nodes: Cervical, supraclavicular, and axillary nodes normal.  Assessment and Plan:  Viral URI with cough Symptoms of  URI are caused by viral infection currently given her current symptoms.   I have explained that in viral URIS, an antibiotic will not help the symptoms and will increase the risk of developing diarrhea. Advised to use oral and nasal decongestants,  Ibuprofen 400 mg and tylenol 650 mq 8 hrs for aches and pains,  tessalon every 8 hours prn cough  Advised to start round of abx only if symptoms worsen to include fevers, facial pain, purulent sputum./drainage.   Obesity (BMI 30.0-34.9) He has lost 27 lb thus far since having bilateral TKR in Nov.  Diet reviewed and encouragement given.    Updated Medication List Outpatient Encounter Prescriptions as of 01/12/2013  Medication Sig  . acetaminophen (TYLENOL) 325 MG tablet Take 2 tablets (650 mg total) by mouth every 6 (six) hours as needed for mild pain (or Fever >/= 101).  . ADVAIR DISKUS 100-50 MCG/DOSE AEPB INHALE ONE DOSE BY MOUTH TWICE DAILY  . dicyclomine (BENTYL) 10 MG capsule Take 1 capsule (10 mg total) by mouth 4 (four) times daily -  with meals and at bedtime.  Marland Kitchen HYDROcodone-acetaminophen (NORCO/VICODIN) 5-325 MG per tablet Take 1 tablet by mouth every 6 (six) hours as needed for moderate pain.  Marland Kitchen lansoprazole (PREVACID) 15 MG capsule Take 1 capsule (15 mg total) by mouth daily at 12 noon.  . methocarbamol (ROBAXIN) 500 MG tablet Take 1 tablet (500 mg total) by mouth 4 (four) times daily.  . methocarbamol (ROBAXIN) 500 MG tablet Take 1 tablet (500 mg total) by mouth every 6 (six) hours as needed for muscle spasms.  Marland Kitchen oxyCODONE (OXY IR/ROXICODONE) 5 MG immediate release tablet Take 1-4 tablets (5-20 mg total) by mouth every 3 (three) hours as needed for moderate pain or severe pain.  Marland Kitchen warfarin (COUMADIN) 4 MG tablet Continue Coumadin until 01/01/2013 for DVT prophylaxis then begin aspirin 81 mg daily  after Coumadin completed  . amoxicillin-clavulanate (AUGMENTIN) 875-125 MG per tablet Take 1 tablet by mouth 2 (two) times daily.  . predniSONE (DELTASONE) 10 MG tablet 6 tablets on Day 1 , then reduce by 1 tablet daily until gone

## 2013-01-12 NOTE — Progress Notes (Signed)
Pre visit review using our clinic review tool, if applicable. No additional management support is needed unless otherwise documented below in the visit note. 

## 2013-01-13 DIAGNOSIS — J069 Acute upper respiratory infection, unspecified: Secondary | ICD-10-CM | POA: Insufficient documentation

## 2013-01-13 DIAGNOSIS — B9789 Other viral agents as the cause of diseases classified elsewhere: Principal | ICD-10-CM

## 2013-01-13 DIAGNOSIS — C4491 Basal cell carcinoma of skin, unspecified: Secondary | ICD-10-CM

## 2013-01-13 HISTORY — DX: Basal cell carcinoma of skin, unspecified: C44.91

## 2013-01-13 HISTORY — PX: JOINT REPLACEMENT: SHX530

## 2013-01-13 NOTE — Assessment & Plan Note (Signed)
Symptoms of  URI are caused by viral infection currently given her current symptoms.   I have explained that in viral URIS, an antibiotic will not help the symptoms and will increase the risk of developing diarrhea. Advised to use oral and nasal decongestants,  Ibuprofen 400 mg and tylenol 650 mq 8 hrs for aches and pains,  tessalon every 8 hours prn cough  Advised to start round of abx only if symptoms worsen to include fevers, facial pain, purulent sputum./drainage.  

## 2013-01-13 NOTE — Assessment & Plan Note (Signed)
He has lost 27 lb thus far since having bilateral TKR in Nov.  Diet reviewed and encouragement given.

## 2013-01-18 DIAGNOSIS — M25569 Pain in unspecified knee: Secondary | ICD-10-CM | POA: Diagnosis not present

## 2013-01-25 DIAGNOSIS — M25569 Pain in unspecified knee: Secondary | ICD-10-CM | POA: Diagnosis not present

## 2013-01-27 DIAGNOSIS — M25569 Pain in unspecified knee: Secondary | ICD-10-CM | POA: Diagnosis not present

## 2013-02-01 DIAGNOSIS — M25569 Pain in unspecified knee: Secondary | ICD-10-CM | POA: Diagnosis not present

## 2013-02-03 DIAGNOSIS — M25569 Pain in unspecified knee: Secondary | ICD-10-CM | POA: Diagnosis not present

## 2013-04-27 DIAGNOSIS — L820 Inflamed seborrheic keratosis: Secondary | ICD-10-CM | POA: Diagnosis not present

## 2013-04-27 DIAGNOSIS — L821 Other seborrheic keratosis: Secondary | ICD-10-CM | POA: Diagnosis not present

## 2013-04-27 DIAGNOSIS — D485 Neoplasm of uncertain behavior of skin: Secondary | ICD-10-CM | POA: Diagnosis not present

## 2013-04-27 DIAGNOSIS — D239 Other benign neoplasm of skin, unspecified: Secondary | ICD-10-CM | POA: Diagnosis not present

## 2013-05-18 DIAGNOSIS — D485 Neoplasm of uncertain behavior of skin: Secondary | ICD-10-CM | POA: Diagnosis not present

## 2013-05-23 ENCOUNTER — Other Ambulatory Visit: Payer: Self-pay | Admitting: *Deleted

## 2013-05-23 MED ORDER — DICYCLOMINE HCL 10 MG PO CAPS: 10.0000 mg | ORAL_CAPSULE | Freq: Three times a day (TID) | ORAL | Status: DC

## 2013-05-23 NOTE — Telephone Encounter (Signed)
Refill sent.

## 2013-05-23 NOTE — Telephone Encounter (Signed)
Refill

## 2013-08-13 ENCOUNTER — Other Ambulatory Visit: Payer: Self-pay | Admitting: Internal Medicine

## 2013-12-29 ENCOUNTER — Telehealth: Payer: Self-pay

## 2013-12-29 NOTE — Telephone Encounter (Signed)
The patient called and is hoping to get orders to have his thyroid levels checked.  He stated thyroid problems run in his family and he has been experiencing fatigue lately.

## 2013-12-29 NOTE — Telephone Encounter (Signed)
Yes he needs to make an appt first

## 2013-12-29 NOTE — Telephone Encounter (Signed)
Last visit 01/12/13 for acute visit. He need appt first?

## 2013-12-30 NOTE — Telephone Encounter (Signed)
Please call and schedule, thanks! 

## 2014-01-01 ENCOUNTER — Other Ambulatory Visit: Payer: Self-pay | Admitting: Internal Medicine

## 2014-01-02 NOTE — Telephone Encounter (Signed)
Appt 02/03/14

## 2014-02-03 ENCOUNTER — Ambulatory Visit: Payer: PRIVATE HEALTH INSURANCE | Admitting: Internal Medicine

## 2014-03-28 ENCOUNTER — Encounter: Payer: Self-pay | Admitting: Internal Medicine

## 2014-03-28 ENCOUNTER — Ambulatory Visit (INDEPENDENT_AMBULATORY_CARE_PROVIDER_SITE_OTHER): Payer: Medicare Other | Admitting: Internal Medicine

## 2014-03-28 VITALS — BP 122/74 | HR 63 | Temp 97.8°F | Resp 16 | Ht 70.0 in | Wt 230.0 lb

## 2014-03-28 DIAGNOSIS — J4530 Mild persistent asthma, uncomplicated: Secondary | ICD-10-CM | POA: Diagnosis not present

## 2014-03-28 DIAGNOSIS — R5383 Other fatigue: Secondary | ICD-10-CM

## 2014-03-28 DIAGNOSIS — Z125 Encounter for screening for malignant neoplasm of prostate: Secondary | ICD-10-CM

## 2014-03-28 DIAGNOSIS — R739 Hyperglycemia, unspecified: Secondary | ICD-10-CM

## 2014-03-28 DIAGNOSIS — Z23 Encounter for immunization: Secondary | ICD-10-CM | POA: Diagnosis not present

## 2014-03-28 DIAGNOSIS — E669 Obesity, unspecified: Secondary | ICD-10-CM

## 2014-03-28 DIAGNOSIS — E66811 Obesity, class 1: Secondary | ICD-10-CM

## 2014-03-28 DIAGNOSIS — R635 Abnormal weight gain: Secondary | ICD-10-CM

## 2014-03-28 DIAGNOSIS — E785 Hyperlipidemia, unspecified: Secondary | ICD-10-CM | POA: Diagnosis not present

## 2014-03-28 MED ORDER — MONTELUKAST SODIUM 10 MG PO TABS: 10.0000 mg | ORAL_TABLET | Freq: Every day | ORAL | Status: DC

## 2014-03-28 MED ORDER — FLUTICASONE-SALMETEROL 250-50 MCG/DOSE IN AEPB: 1.0000 | INHALATION_SPRAY | Freq: Two times a day (BID) | RESPIRATORY_TRACT | Status: DC

## 2014-03-28 NOTE — Assessment & Plan Note (Addendum)
Nathan Ramirez of diagnosis unclear, and was made several  years ago. PFTs were done at the [facility] a year ago during an annual exam   Quit smoking 15 yrs  Ago. His exam is normal today  But given his report at home of daily wheezing,  I have added Singulair and increased his Advair dose to 250/50

## 2014-03-28 NOTE — Progress Notes (Signed)
Patient ID: Nathan Ramirez, male   DOB: 05/01/1945, 69 y.o.   MRN: [id number]    Patient Active Problem List   Diagnosis Date Noted  . Nonspecific abnormal results of liver function study 12/08/2012  . Elevated liver function tests 12/08/2012  . Urinary retention 12/06/2012  . Status post total bilateral knee replacement 12/06/2012  . Hyponatremia 12/03/2012  . Sinus tachycardia by electrocardiogram 12/03/2012  . Postoperative anemia due to acute blood loss 12/02/2012  . OA (osteoarthritis) of knee 12/01/2012  . Medicare welcome visit 11/19/2012  . Other and unspecified hyperlipidemia 11/17/2012  . Preoperative evaluation to rule out surgical contraindication 10/11/2012  . Irritable bowel syndrome 10/01/2012  . GERD (gastroesophageal reflux disease) 09/19/2012  . Physical exam 06/15/2012  . Hyperlipidemia LDL goal <100 11/17/2011  . Obesity (BMI 30.0-34.9) 11/17/2011  . ALLERGIC RHINITIS CAUSE UNSPECIFIED 02/20/2010  . Asthma 02/20/2010    Subjective:  CC:   Chief Complaint  Patient presents with  . Wheezing    hears self wheezing at night    HPI:   Nathan Ramirez is a 69 y.o. male who presents for Persistent wheezing for the past two months .   Occurs at rest. Using Advair morning and night. He has been  taking some OTC medication not sure which one, for  allergies 2 red pills, which has helped his sneezing    Had a GI illness a few weeks ago,  Occurred over the weekend and resolved in 48 hours.2 week.  Last meal  Was 12:00 pm.     Weight gain.  He has not been seen since Dec 2014,  Has not been exercising or follow a specific diet and has gained over 20 lbs during the last year. "I like to eat."  Wt Readings from Last 3 Encounters:  03/28/14 230 lb (104.327 kg)  01/12/13 202 lb (91.627 kg)  12/07/12 229 lb 4.8 oz (104.01 kg)    Past Medical History  Diagnosis Date  . Arthritis   . Chicken pox   . Allergy   . Elevated blood pressure reading   . GERD  (gastroesophageal reflux disease)     never had EGD  . Hypercholesteremia     borderline  . Asthma     "allergic"  . Headache(784.0)     occasional tension  . PONV (postoperative nausea and vomiting)   . Colon polyp ~ 2012    pathology unknown, colonoscopy at [facility] in [place].     Past Surgical History  Procedure Laterality Date  . Lumbar disc surgery  2008  . Knee arthroscopy  2009& 2010    RIGHT AND LEFT KNEE  . Appendectomy  1972  . Total knee arthroplasty Bilateral 12/01/2012    Procedure: TOTAL KNEE BILATERAL;  Surgeon: Gearlean Alf, MD;  Location: [facility];  Service: Orthopedics;  Laterality: Bilateral;       The following portions of the patient's history were reviewed and updated as appropriate: Allergies, current medications, and problem list.    Review of Systems:   Patient denies headache, fevers, malaise, unintentional weight loss, skin rash, eye pain, sinus congestion and sinus pain, sore throat, dysphagia,  hemoptysis , cough, dyspnea, wheezing, chest pain, palpitations, orthopnea, edema, abdominal pain, nausea, melena, diarrhea, constipation, flank pain, dysuria, hematuria, urinary  Frequency, nocturia, numbness, tingling, seizures,  Focal weakness, Loss of consciousness,  Tremor, insomnia, depression, anxiety, and suicidal ideation.     History   Social History  . Marital Status: Married  Spouse Name: N/A  . Number of Children: N/A  . Years of Education: N/A   Occupational History  . Not on file.   Social History Main Topics  . Smoking status: Former Smoker -- 40 years    Types: Cigarettes    Quit date: 01/14/1988  . Smokeless tobacco: Never Used  . Alcohol Use: Yes     Comment: OCCASSIONALLY  . Drug Use: No  . Sexual Activity: Not on file   Other Topics Concern  . Not on file   Social History Narrative    Objective:  Filed Vitals:   03/28/14 1454  BP: 122/74  Pulse: 63  Temp: 97.8 F (36.6 C)  Resp: 16      General appearance: alert, cooperative and appears stated age Ears: normal TM's and external ear canals both ears Throat: lips, mucosa, and tongue normal; teeth and gums normal Neck: no adenopathy, no carotid bruit, supple, symmetrical, trachea midline and thyroid not enlarged, symmetric, no tenderness/mass/nodules Back: symmetric, no curvature. ROM normal. No CVA tenderness. Lungs: clear to auscultation bilaterally Heart: regular rate and rhythm, S1, S2 normal, no murmur, click, rub or gallop Abdomen: soft, non-tender; bowel sounds normal; no masses,  no organomegaly Pulses: 2+ and symmetric Skin: Skin color, texture, turgor normal. No rashes or lesions Lymph nodes: Cervical, supraclavicular, and axillary nodes normal.  Assessment and Plan:  Asthma Nature of diagnosis unclear, and was made several  years ago. PFTs were done at the [facility] a year ago during an annual exam   Quit smoking 15 yrs  Ago. His exam is normal today  But given his report at home of daily wheezing,  I have added Singulair and increased his Advair dose to 250/50   Hyperlipidemia LDL goal <100     Obesity (BMI 30.0-34.9) I have addressed  His weight gain of > 20 lbs since last years,  His BMI and recommended a low glycemic index diet utilizing smaller more frequent meals to increase metabolism.  I have also recommended that patient start exercising with a goal of 30 minutes of aerobic exercise a minimum of 5 days per week. Screening for lipid disorders, thyroid and diabetes to be done      Updated Medication List Outpatient Encounter Prescriptions as of 03/28/2014  Medication Sig  . Fluticasone-Salmeterol (ADVAIR DISKUS) 250-50 MCG/DOSE AEPB Inhale 1 puff into the lungs 2 (two) times daily.  . montelukast (SINGULAIR) 10 MG tablet Take 1 tablet (10 mg total) by mouth at bedtime.  . [DISCONTINUED] acetaminophen (TYLENOL) 325 MG tablet Take 2 tablets (650 mg total) by mouth every 6 (six) hours as needed  for mild pain (or Fever >/= 101). (Patient not taking: Reported on 03/28/2014)  . [DISCONTINUED] ADVAIR DISKUS 100-50 MCG/DOSE AEPB INHALE ONE DOSE BY MOUTH TWICE DAILY  . [DISCONTINUED] amoxicillin-clavulanate (AUGMENTIN) 875-125 MG per tablet Take 1 tablet by mouth 2 (two) times daily. (Patient not taking: Reported on 03/28/2014)  . [DISCONTINUED] dicyclomine (BENTYL) 10 MG capsule Take 1 capsule (10 mg total) by mouth 4 (four) times daily -  with meals and at bedtime. (Patient not taking: Reported on 03/28/2014)  . [DISCONTINUED] HYDROcodone-acetaminophen (NORCO/VICODIN) 5-325 MG per tablet Take 1 tablet by mouth every 6 (six) hours as needed for moderate pain. (Patient not taking: Reported on 03/28/2014)  . [DISCONTINUED] lansoprazole (PREVACID) 15 MG capsule Take 1 capsule (15 mg total) by mouth daily at 12 noon. (Patient not taking: Reported on 03/28/2014)  . [DISCONTINUED] methocarbamol (ROBAXIN) 500 MG tablet Take  1 tablet (500 mg total) by mouth 4 (four) times daily. (Patient not taking: Reported on 03/28/2014)  . [DISCONTINUED] methocarbamol (ROBAXIN) 500 MG tablet Take 1 tablet (500 mg total) by mouth every 6 (six) hours as needed for muscle spasms. (Patient not taking: Reported on 03/28/2014)  . [DISCONTINUED] montelukast (SINGULAIR) 10 MG tablet Take 1 tablet (10 mg total) by mouth at bedtime.  . [DISCONTINUED] oxyCODONE (OXY IR/ROXICODONE) 5 MG immediate release tablet Take 1-4 tablets (5-20 mg total) by mouth every 3 (three) hours as needed for moderate pain or severe pain. (Patient not taking: Reported on 03/28/2014)  . [DISCONTINUED] predniSONE (DELTASONE) 10 MG tablet 6 tablets on Day 1 , then reduce by 1 tablet daily until gone (Patient not taking: Reported on 03/28/2014)  . [DISCONTINUED] warfarin (COUMADIN) 4 MG tablet Continue Coumadin until 01/01/2013 for DVT prophylaxis then begin aspirin 81 mg daily after Coumadin completed (Patient not taking: Reported on 03/28/2014)     Orders Placed  This Encounter  Procedures  . Pneumococcal conjugate vaccine 13-valent  . CBC with Differential/Platelet  . Comprehensive metabolic panel  . TSH  . Lipid panel  . PSA, Medicare  . Hemoglobin A1c    No Follow-up on file.

## 2014-03-28 NOTE — Patient Instructions (Signed)
I have increased the Advair to a higher strength and I have added  daily singulair for both asthma and allergic rhinitis  Please schedule you annual exam with fasting labs same day   You have gained 28 lbs since your last visit !! I want you to lose 24  lbs over the next 6 months  This is Dr. Lupita Dawn version of a  "Low GI"  Weight loss Diet.  It is appropriate for all patients with normal renal function , gluten tolerance, and advised for patients who have prediabetes or diabetes:   All of the foods can be found at grocery stores and in bulk at [employer].  The Atkins protein bars and shakes are available in more varieties at Target, [employer]Mart and [employer].     7 AM Breakfast:  Choose from the following:  < 5 carbs  Weekdays: Low carbohydrate Protein  Shakes (EAS AdvantEdge "Carb  Control" shakes, Atkins,  Muscle Milk or Premier Protein shakes)     Weekends:  a scrambled egg/bacon/cheese burrito made with Mission's "carb  balance" whole wheat tortilla  (about 10 net carbs )  Eggs,  bacon /sausage , Joseph's pita /lavash bread or  (5 carbs)  A slice of fritatta ( egg based baked dish, no  crust:  google it) (< 10 carbs)   Avoid cereal and bananas, oatmeal and cream of wheat and grits. They are loaded with carbohydrates!   10 AM: high protein snack  (< 5 carbs)   Protein bar by Atkins  Or KIND  (the snack size, < 200 cal, usually < 6 carbs    A stick of cheese:  Around 1 carb,  100 cal      Other so called "protein bars" tend to be loaded with carbohydrates.  Remember, in food advertising, the word "energy" is synonymous for " carbohydrate."  Lunch:   A Sandwich using the bread choices listed, Can use any  Eggs,  lunchmeat, grilled meat or canned tuna).  Can add avocado, regular mayo/mustard  and cheese.  A Salad using blue cheese, ranch,  Goddess dressing  or vinagrette,  No croutons or "confetti" and no "candied nuts" but regular nuts OK.   2 HARD BOILED EGG WHITES AND A CUP OF one of  these greek yogurts:    dannon lt n fit greek yogurt         chobani 100 greek yogurt,    Oikos triple zero greek yogurt       No pretzels or chips.  Pickles and miniature sweet peppers are a good low  carb alternative that provide a "crunch"  The bread is the only source of carbohydrate in a sandwich and  can be decreased by trying some of these alternatives to traditional loaf bread:   Joseph's pita bread and Lavash (flat) bread :  50 cal and 4 net carbs  available at BJs and WalMart.  Taste better when toasted, use as pita chips  Touf[employer] makes a variety of  flatbreads and  A PITA POCKET.    LOOK FOR  THE ONES THAT ARE 17 NET CARBS OR LESS    Mission makes 2 sizes of  Low carb whole wheat tortillas  (The large one is  210 cal and 6 net carbs)   Avoid "Low fat dressings, as well as Barry Brunner and [place] dressings    3 PM/ Mid day  Snack:  Consider  1 ounce of  almonds, walnuts, pistachios, pecans, peanuts,  Macadamia nuts or a nut medley that does not contain raisins or cranberries.  No "granola"; the dried cranberries and raisins are loaded with carbohydrates. Mixed nuts as long as there are no raisins,  cranberries or dried fruit.    Try the prosciutto/mozzarella cheese sticks by [employer]  In deli /backery section   High protein   To avoid overindulging in snacks: Try drinking a glass of unsweeted almond/coconut milk  Or a cup of coffee with your Atkins chocolate bar to keep you from having 3!!!   Pork rinds!  Yes Pork Rinds are low carb potato chip substitute!   Toasted Joseph's flatbread with hummous dip (chickpeas)       6 PM  Dinner:     Meat/fowl/fish with a green salad, and either broccoli, cauliflower, green beans, spinach, brussel sprouts, bok choy or  Lima beans. Fried in canola oil /olive oil BUT DO NOT BREAD THE PROTEIN!!      There is a low carb pasta by Dreamfield's that is acceptable and tastes great: only 5 digestible carbs/serving.( All grocery stores but BJs  carry it )  Prepared Meals:  Try Hurley Cisco Angelo's chicken piccata or chicken or eggplant parm over low carb pasta.(Lowes and BJs)   Marjory Lies Sanchez's "Carnitas" (pulled pork, no sauce,  0 carbs) or his beef pot roast to make a dinner burrito (at [employer])  Barbecue with cole slaw is low carb BUT NO BUN!  SAME WITH HAMBURGERS     Whole wheat pasta is still full of digestible carbs and  Not as low in glycemic index as Dreamfield's.   Brown rice is still rice,  So skip the rice and noodles if you eat [place] or [place] (or at least limit to 1/2 cup)  9 PM snack :   Breyer's "low carb" fudgsicle or  ice cream bar (Carb Smart line), or  Weight  Watcher's ice cream bar , or another "no sugar added" ice cream;  a serving of fresh berries/cherries with whipped cream   Cheese or greek yogurt   8 ounces of [place] unsweetened almond/cococunut milk  Cheese and crackers (using WASA crackers,  They are low carb) or peanut butter on low carb crackers or pita bread     Avoid bananas, pineapple, grapes  and watermelon on a regular basis because they are high in sugar.  THINK OF THEM AS DESSERT and do not have daily   Remember that snack Substitutions should be less than 10 NET carbs per serving and meals should be < 20 net carbs. Remember that carbohydrates from fiber do not affect blood sugar, so you can  subtract fiber grams to get the "net carbs " of any particular food item.

## 2014-03-30 ENCOUNTER — Encounter: Payer: Self-pay | Admitting: Internal Medicine

## 2014-03-30 NOTE — Assessment & Plan Note (Signed)
I have addressed  His weight gain of > 20 lbs since last years,  His BMI and recommended a low glycemic index diet utilizing smaller more frequent meals to increase metabolism.  I have also recommended that patient start exercising with a goal of 30 minutes of aerobic exercise a minimum of 5 days per week. Screening for lipid disorders, thyroid and diabetes to be done

## 2014-04-14 DIAGNOSIS — M7542 Impingement syndrome of left shoulder: Secondary | ICD-10-CM | POA: Diagnosis not present

## 2014-04-14 DIAGNOSIS — M25512 Pain in left shoulder: Secondary | ICD-10-CM | POA: Diagnosis not present

## 2014-05-01 DIAGNOSIS — M7542 Impingement syndrome of left shoulder: Secondary | ICD-10-CM | POA: Diagnosis not present

## 2014-05-18 ENCOUNTER — Telehealth: Payer: Self-pay | Admitting: Internal Medicine

## 2014-05-18 ENCOUNTER — Ambulatory Visit (INDEPENDENT_AMBULATORY_CARE_PROVIDER_SITE_OTHER): Payer: Medicare Other | Admitting: Internal Medicine

## 2014-05-18 ENCOUNTER — Encounter: Payer: Self-pay | Admitting: Internal Medicine

## 2014-05-18 VITALS — BP 118/78 | HR 64 | Temp 98.6°F | Resp 18 | Ht 70.75 in | Wt 223.5 lb

## 2014-05-18 DIAGNOSIS — E785 Hyperlipidemia, unspecified: Secondary | ICD-10-CM

## 2014-05-18 DIAGNOSIS — Z0000 Encounter for general adult medical examination without abnormal findings: Secondary | ICD-10-CM

## 2014-05-18 DIAGNOSIS — J4530 Mild persistent asthma, uncomplicated: Secondary | ICD-10-CM

## 2014-05-18 DIAGNOSIS — R635 Abnormal weight gain: Secondary | ICD-10-CM

## 2014-05-18 DIAGNOSIS — E669 Obesity, unspecified: Secondary | ICD-10-CM | POA: Diagnosis not present

## 2014-05-18 DIAGNOSIS — J449 Chronic obstructive pulmonary disease, unspecified: Secondary | ICD-10-CM | POA: Insufficient documentation

## 2014-05-18 DIAGNOSIS — R739 Hyperglycemia, unspecified: Secondary | ICD-10-CM

## 2014-05-18 DIAGNOSIS — Z1211 Encounter for screening for malignant neoplasm of colon: Secondary | ICD-10-CM

## 2014-05-18 DIAGNOSIS — Z125 Encounter for screening for malignant neoplasm of prostate: Secondary | ICD-10-CM

## 2014-05-18 DIAGNOSIS — R5383 Other fatigue: Secondary | ICD-10-CM

## 2014-05-18 LAB — COMPREHENSIVE METABOLIC PANEL
ALT: 53 U/L (ref 0–53)
AST: 32 U/L (ref 0–37)
Albumin: 4.3 g/dL (ref 3.5–5.2)
Alkaline Phosphatase: 65 U/L (ref 39–117)
BUN: 15 mg/dL (ref 6–23)
CO2: 26 mEq/L (ref 19–32)
Calcium: 9.8 mg/dL (ref 8.4–10.5)
Chloride: 104 mEq/L (ref 96–112)
Creatinine, Ser: 1.02 mg/dL (ref 0.40–1.50)
GFR: 77.5 mL/min (ref >60.00)
Glucose, Bld: 95 mg/dL (ref 70–99)
Potassium: 4.4 mEq/L (ref 3.5–5.1)
Sodium: 136 mEq/L (ref 135–145)
Total Bilirubin: 0.6 mg/dL (ref 0.2–1.2)
Total Protein: 7.3 g/dL (ref 6.0–8.3)

## 2014-05-18 LAB — CBC WITH DIFFERENTIAL/PLATELET
Basophils Absolute: 0 10*3/uL (ref 0.0–0.1)
Basophils Relative: 0.6 % (ref 0.0–3.0)
Eosinophils Absolute: 0.3 10*3/uL (ref 0.0–0.7)
Eosinophils Relative: 4.2 % (ref 0.0–5.0)
HCT: 43.8 % (ref 39.0–52.0)
Hemoglobin: 15.6 g/dL (ref 13.0–17.0)
Lymphocytes Relative: 23.4 % (ref 12.0–46.0)
Lymphs Abs: 1.8 10*3/uL (ref 0.7–4.0)
MCHC: 35.6 g/dL (ref 30.0–36.0)
MCV: 92.2 fl (ref 78.0–100.0)
Monocytes Absolute: 0.6 10*3/uL (ref 0.1–1.0)
Monocytes Relative: 8.2 % (ref 3.0–12.0)
Neutro Abs: 4.8 10*3/uL (ref 1.4–7.7)
Neutrophils Relative %: 63.6 % (ref 43.0–77.0)
Platelets: 296 10*3/uL (ref 150.0–400.0)
RBC: 4.76 Mil/uL (ref 4.22–5.81)
RDW: 13.4 % (ref 11.5–15.5)
WBC: 7.5 10*3/uL (ref 4.0–10.5)

## 2014-05-18 LAB — HEMOGLOBIN A1C: Hgb A1c MFr Bld: 5.9 % (ref 4.6–6.5)

## 2014-05-18 LAB — TSH: TSH: 1 u[IU]/mL (ref 0.35–4.50)

## 2014-05-18 LAB — LIPID PANEL
Cholesterol: 175 mg/dL (ref 0–200)
HDL: 42.9 mg/dL (ref >39.00)
LDL Cholesterol: 115 mg/dL — ABNORMAL HIGH (ref 0–99)
NonHDL: 132.1
Total CHOL/HDL Ratio: 4
Triglycerides: 86 mg/dL (ref 0.0–149.0)
VLDL: 17.2 mg/dL (ref 0.0–40.0)

## 2014-05-18 LAB — PSA, MEDICARE: PSA: 0.42 ng/ml (ref 0.10–4.00)

## 2014-05-18 MED ORDER — PREDNISONE 10 MG PO TABS: ORAL_TABLET | ORAL | Status: DC

## 2014-05-18 MED ORDER — TIOTROPIUM BROMIDE MONOHYDRATE 18 MCG IN CAPS: 18.0000 ug | ORAL_CAPSULE | Freq: Every day | RESPIRATORY_TRACT | Status: DC

## 2014-05-18 MED ORDER — HYDROCOD POLST-CPM POLST ER 10-8 MG/5ML PO SUER: 5.0000 mL | Freq: Every evening | ORAL | Status: DC | PRN

## 2014-05-18 NOTE — Progress Notes (Signed)
Pre-visit discussion using our clinic review tool. No additional management support is needed unless otherwise documented below in the visit note.  

## 2014-05-18 NOTE — Telephone Encounter (Signed)
Patient wife called and left message stating could visit be coded as asthma with seasonal allergies that patient does not have COPD. Patient does not want COPD on record.

## 2014-05-18 NOTE — Telephone Encounter (Signed)
Will do, until PFTS are done and confirm either diagnosis.

## 2014-05-18 NOTE — Patient Instructions (Signed)
Your Colonoscopy referral is in process to Dr Bary Castilla  Your breathing symptoms are from COPD.   Please  Advair twice daily  add I am adding Spiriva once daily .  These are "maintenance inhalers" meaning they should be used continually , even if you have no symptoms. .  The albuterol inhaler is your rescue inhaler.  This can be used every 6 hours for immediate relief of wheezing or shortness of breathLet me know if you need a refill on this one .  The prednisone taper is a one time 6 day taper to knock down the inflammation,  The the tussionex cough syrup is for nighttime use only  It may make you constipated because it has vicodin in it.  Use Senna or dulcolax if needed.    I would like to see you back in 3 weeks and will refer you for pulmonary function tests at [facility] once you are back to normal.  Health Maintenance A healthy lifestyle and preventative care can promote health and wellness.  Maintain regular health, dental, and eye exams.  Eat a healthy diet. Foods like vegetables, fruits, whole grains, low-fat dairy products, and lean protein foods contain the nutrients you need and are low in calories. Decrease your intake of foods high in solid fats, added sugars, and salt. Get information about a proper diet from your health care provider, if necessary.  Regular physical exercise is one of the most important things you can do for your health. Most adults should get at least 150 minutes of moderate-intensity exercise (any activity that increases your heart rate and causes you to sweat) each week. In addition, most adults need muscle-strengthening exercises on 2 or more days a week.   Maintain a healthy weight. The body mass index (BMI) is a screening tool to identify possible weight problems. It provides an estimate of body fat based on height and weight. Your health care provider can find your BMI and can help you achieve or maintain a healthy weight. For males 20 years and older:  A BMI  below 18.5 is considered underweight.  A BMI of 18.5 to 24.9 is normal.  A BMI of 25 to 29.9 is considered overweight.  A BMI of 30 and above is considered obese.  Maintain normal blood lipids and cholesterol by exercising and minimizing your intake of saturated fat. Eat a balanced diet with plenty of fruits and vegetables. Blood tests for lipids and cholesterol should begin at age 31 and be repeated every 5 years. If your lipid or cholesterol levels are high, you are over age 28, or you are at high risk for heart disease, you may need your cholesterol levels checked more frequently.Ongoing high lipid and cholesterol levels should be treated with medicines if diet and exercise are not working.  If you smoke, find out from your health care provider how to quit. If you do not use tobacco, do not start.  Lung cancer screening is recommended for adults aged 30-80 years who are at high risk for developing lung cancer because of a history of smoking. A yearly low-dose CT scan of the lungs is recommended for people who have at least a 30-pack-year history of smoking and are current smokers or have quit within the past 15 years. A pack year of smoking is smoking an average of 1 pack of cigarettes a day for 1 year (for example, a 30-pack-year history of smoking could mean smoking 1 pack a day for 30 years or 2 packs  a day for 15 years). Yearly screening should continue until the smoker has stopped smoking for at least 15 years. Yearly screening should be stopped for people who develop a health problem that would prevent them from having lung cancer treatment.  If you choose to drink alcohol, do not have more than 2 drinks per day. One drink is considered to be 12 oz (360 mL) of beer, 5 oz (150 mL) of wine, or 1.5 oz (45 mL) of liquor.  Avoid the use of street drugs. Do not share needles with anyone. Ask for help if you need support or instructions about stopping the use of drugs.  High blood pressure  causes heart disease and increases the risk of stroke. Blood pressure should be checked at least every 1-2 years. Ongoing high blood pressure should be treated with medicines if weight loss and exercise are not effective.  If you are 67-52 years old, ask your health care provider if you should take aspirin to prevent heart disease.  Diabetes screening involves taking a blood sample to check your fasting blood sugar level. This should be done once every 3 years after age 82 if you are at a normal weight and without risk factors for diabetes. Testing should be considered at a younger age or be carried out more frequently if you are overweight and have at least 1 risk factor for diabetes.  Colorectal cancer can be detected and often prevented. Most routine colorectal cancer screening begins at the age of 67 and continues through age 33. However, your health care provider may recommend screening at an earlier age if you have risk factors for colon cancer. On a yearly basis, your health care provider may provide home test kits to check for hidden blood in the stool. A small camera at the end of a tube may be used to directly examine the colon (sigmoidoscopy or colonoscopy) to detect the earliest forms of colorectal cancer. Talk to your health care provider about this at age 8 when routine screening begins. A direct exam of the colon should be repeated every 5-10 years through age 38, unless early forms of precancerous polyps or small growths are found.  People who are at an increased risk for hepatitis B should be screened for this virus. You are considered at high risk for hepatitis B if:  You were born in a country where hepatitis B occurs often. Talk with your health care provider about which countries are considered high risk.  Your parents were born in a high-risk country and you have not received a shot to protect against hepatitis B (hepatitis B vaccine).  You have HIV or AIDS.  You use needles to  inject street drugs.  You live with, or have sex with, someone who has hepatitis B.  You are a man who has sex with other men (MSM).  You get hemodialysis treatment.  You take certain medicines for conditions like cancer, organ transplantation, and autoimmune conditions.  Hepatitis C blood testing is recommended for all people born from [birth year] through 1963 and any individual with known risk factors for hepatitis C.  Healthy men should no longer receive prostate-specific antigen (PSA) blood tests as part of routine cancer screening. Talk to your health care provider about prostate cancer screening.  Testicular cancer screening is not recommended for adolescents or adult males who have no symptoms. Screening includes self-exam, a health care provider exam, and other screening tests. Consult with your health care provider about any symptoms you have  or any concerns you have about testicular cancer.  Practice safe sex. Use condoms and avoid high-risk sexual practices to reduce the spread of sexually transmitted infections (STIs).  You should be screened for STIs, including gonorrhea and chlamydia if:  You are sexually active and are younger than 24 years.  You are older than 24 years, and your health care provider tells you that you are at risk for this type of infection.  Your sexual activity has changed since you were last screened, and you are at an increased risk for chlamydia or gonorrhea. Ask your health care provider if you are at risk.  If you are at risk of being infected with HIV, it is recommended that you take a prescription medicine daily to prevent HIV infection. This is called pre-exposure prophylaxis (PrEP). You are considered at risk if:  You are a man who has sex with other men (MSM).  You are a heterosexual man who is sexually active with multiple partners.  You take drugs by injection.  You are sexually active with a partner who has HIV.  Talk with your health care  provider about whether you are at high risk of being infected with HIV. If you choose to begin PrEP, you should first be tested for HIV. You should then be tested every 3 months for as long as you are taking PrEP.  Use sunscreen. Apply sunscreen liberally and repeatedly throughout the day. You should seek shade when your shadow is shorter than you. Protect yourself by wearing long sleeves, pants, a wide-brimmed hat, and sunglasses year round whenever you are outdoors.  Tell your health care provider of new moles or changes in moles, especially if there is a change in shape or color. Also, tell your health care provider if a mole is larger than the size of a pencil eraser.  A one-time screening for abdominal aortic aneurysm (AAA) and surgical repair of large AAAs by ultrasound is recommended for men aged 94-75 years who are current or former smokers.  Stay current with your vaccines (immunizations). Document Released: 06/28/2007 Document Revised: 01/04/2013 Document Reviewed: 05/27/2010 [facility] Patient Information 2015 [place], [place]. This information is not intended to replace advice given to you by your health care provider. Make sure you discuss any questions you have with your health care provider.

## 2014-05-18 NOTE — Progress Notes (Signed)
Patient ID: Nathan Ramirez, male   DOB: 1945/05/28, 69 y.o.   MRN: [id number]  The patient is here for annual Medicare wellness examination and management of other chronic and acute problems.  Still wheezing, worse at night,  worse with exertion,  Cough productive of clear sputum.  Has been present for several weeks .  No recent travel,  No fevers,  Unintentional weight loss, or fluid retention in legs.     The risk factors are reflected in the social history.  The roster of all physicians providing medical care to patient - is listed in the Snapshot section of the chart.  Activities of daily living:  The patient is 100% independent in all ADLs: dressing, toileting, feeding as well as independent mobility  Home safety : The patient has smoke detectors in the home. They wear seatbelts.  There are no firearms at home. There is no violence in the home.   There is no risks for hepatitis, STDs or HIV. There is no   history of blood transfusion. They have no travel history to infectious disease endemic areas of the world.  The patient has seen their dentist in the last six month. They have seen their eye doctor in the last year. They admit to slight hearing difficulty with regard to whispered voices and some television programs.  They have deferred audiologic testing in the last year.  They do not  have excessive sun exposure. Discussed the need for sun protection: hats, long sleeves and use of sunscreen if there is significant sun exposure.   Diet: the importance of a healthy diet is discussed. They do have a healthy diet.  The benefits of regular aerobic exercise were discussed. She walks 4 times per week ,  20 minutes.   Depression screen: there are no signs or vegative symptoms of depression- irritability, change in appetite, anhedonia, sadness/tearfullness.  Cognitive assessment: the patient manages all their financial and personal affairs and is actively engaged. They could relate  day,date,year and events; recalled 2/3 objects at 3 minutes; performed clock-face test normally.  The following portions of the patient's history were reviewed and updated as appropriate: allergies, current medications, past family history, past medical history,  past surgical history, past social history  and problem list.  Visual acuity was not assessed per patient preference since she has regular follow up with her ophthalmologist. Hearing and body mass index were assessed and reviewed.   During the course of the visit the patient was educated and counseled about appropriate screening and preventive services including : fall prevention , diabetes screening, nutrition counseling, colorectal cancer screening, and recommended immunizations.    Review of Systems:  Patient denies headache, fevers, malaise, unintentional weight loss, skin rash, eye pain, sinus congestion and sinus pain, sore throat, dysphagia,  hemoptysis ,  chest pain, palpitations, orthopnea, edema, abdominal pain, nausea, melena, diarrhea, constipation, flank pain, dysuria, hematuria, urinary  Frequency, nocturia, numbness, tingling, seizures,  Focal weakness, Loss of consciousness,  Tremor, insomnia, depression, anxiety, and suicidal ideation.    Objective:  BP 118/78 mmHg  Pulse 64  Temp(Src) 98.6 F (37 C) (Oral)  Resp 18  Ht 5' 10.75" (1.797 m)  Wt 223 lb 8 oz (101.379 kg)  BMI 31.39 kg/m2  SpO2 95%  General appearance: alert, cooperative and appears stated age Ears: normal TM's and external ear canals both ears Throat: lips, mucosa, and tongue normal; teeth and gums normal Neck: no adenopathy, no carotid bruit, supple, symmetrical, trachea midline and thyroid  not enlarged, symmetric, no tenderness/mass/nodules Back: symmetric, no curvature. ROM normal. No CVA tenderness. Lungs: bilateral wheezes with good air movement  Heart: regular rate and rhythm, S1, S2 normal, no murmur, click, rub or gallop Abdomen: soft,  non-tender; bowel sounds normal; no masses,  no organomegaly Pulses: 2+ and symmetric Skin: Skin color, texture, turgor normal. No rashes or lesions Lymph nodes: Cervical, supraclavicular, and axillary nodes normal.  Assessment and Plan:  Problem List Items Addressed This Visit    Asthma    Symptoms are still present since mid march .  Will intensigy therapy ,  Add Spiriva  And Singulair daily to Advair,  And repeat prednisone  taper,  PFTS will need to be repeated when he is back to baseline,      Relevant Medications   predniSONE (DELTASONE) 10 MG tablet   tiotropium (SPIRIVA) 18 MCG inhalation capsule   Hyperlipidemia LDL goal <100    Well controlled on current statin therapy.   Liver enzymes are normal , no changes today.  Lab Results  Component Value Date   CHOL 175 05/18/2014   HDL 42.90 05/18/2014   LDLCALC 115* 05/18/2014   LDLDIRECT 161.8 10/15/2011   TRIG 86.0 05/18/2014   CHOLHDL 4 05/18/2014   Lab Results  Component Value Date   ALT 53 05/18/2014   AST 32 05/18/2014   ALKPHOS 65 05/18/2014   BILITOT 0.6 05/18/2014         Medicare annual wellness visit, subsequent    Annual Medicare wellness  exam was done as well as a comprehensive physical exam and management of acute and chronic conditions .  During the course of the visit the patient was educated and counseled about appropriate screening and preventive services including : fall prevention , diabetes screening, nutrition counseling, colorectal cancer screening, and recommended immunizations.  Printed recommendations for health maintenance screenings was given.       RESOLVED: COPD, moderate   Relevant Medications   predniSONE (DELTASONE) 10 MG tablet   tiotropium (SPIRIVA) 18 MCG inhalation capsule   chlorpheniramine-HYDROcodone (TUSSIONEX PENNKINETIC ER) 10-8 MG/5ML SUER    Other Visit Diagnoses    Screening for colon cancer    -  Primary    Relevant Orders    Ambulatory referral to General Surgery     Obesity        Other fatigue        Screening for prostate cancer        Weight gain        Hyperglycemia

## 2014-05-19 NOTE — Telephone Encounter (Signed)
Patient notified

## 2014-05-20 ENCOUNTER — Encounter: Payer: Self-pay | Admitting: Internal Medicine

## 2014-05-20 NOTE — Assessment & Plan Note (Signed)
Well controlled on current statin therapy.   Liver enzymes are normal , no changes today.  Lab Results  Component Value Date   CHOL 175 05/18/2014   HDL 42.90 05/18/2014   LDLCALC 115* 05/18/2014   LDLDIRECT 161.8 10/15/2011   TRIG 86.0 05/18/2014   CHOLHDL 4 05/18/2014   Lab Results  Component Value Date   ALT 53 05/18/2014   AST 32 05/18/2014   ALKPHOS 65 05/18/2014   BILITOT 0.6 05/18/2014

## 2014-05-20 NOTE — Assessment & Plan Note (Addendum)
Symptoms are still present since mid march .  Will intensigy therapy ,  Add Spiriva  And Singulair daily to Advair,  And repeat prednisone  taper,  PFTS will need to be repeated when he is back to baseline,

## 2014-05-20 NOTE — Assessment & Plan Note (Signed)

## 2014-05-22 ENCOUNTER — Encounter: Payer: Self-pay | Admitting: *Deleted

## 2014-05-25 ENCOUNTER — Encounter: Payer: Self-pay | Admitting: *Deleted

## 2014-06-01 ENCOUNTER — Encounter: Payer: Self-pay | Admitting: General Surgery

## 2014-06-01 ENCOUNTER — Ambulatory Visit (INDEPENDENT_AMBULATORY_CARE_PROVIDER_SITE_OTHER): Payer: Medicare Other | Admitting: General Surgery

## 2014-06-01 VITALS — BP 108/64 | HR 70 | Resp 16 | Ht 69.5 in | Wt 224.0 lb

## 2014-06-01 DIAGNOSIS — Z86010 Personal history of colonic polyps: Secondary | ICD-10-CM | POA: Diagnosis not present

## 2014-06-01 DIAGNOSIS — Z1211 Encounter for screening for malignant neoplasm of colon: Secondary | ICD-10-CM

## 2014-06-01 NOTE — Patient Instructions (Addendum)
Colonoscopy A colonoscopy is an exam to look at the entire large intestine (colon). This exam can help find problems such as tumors, polyps, inflammation, and areas of bleeding. The exam takes about 1 hour.  LET [facility] CARE PROVIDER KNOW ABOUT:   Any allergies you have.  All medicines you are taking, including vitamins, herbs, eye drops, creams, and over-the-counter medicines.  Previous problems you or members of your family have had with the use of anesthetics.  Any blood disorders you have.  Previous surgeries you have had.  Medical conditions you have. RISKS AND COMPLICATIONS  Generally, this is a safe procedure. However, as with any procedure, complications can occur. Possible complications include:  Bleeding.  Tearing or rupture of the colon wall.  Reaction to medicines given during the exam.  Infection (rare). BEFORE THE PROCEDURE   Ask your health care provider about changing or stopping your regular medicines.  You may be prescribed an oral bowel prep. This involves drinking a large amount of medicated liquid, starting the day before your procedure. The liquid will cause you to have multiple loose stools until your stool is almost clear or light green. This cleans out your colon in preparation for the procedure.  Do not eat or drink anything else once you have started the bowel prep, unless your health care provider tells you it is safe to do so.  Arrange for someone to drive you home after the procedure. PROCEDURE   You will be given medicine to help you relax (sedative).  You will lie on your side with your knees bent.  A long, flexible tube with a light and camera on the end (colonoscope) will be inserted through the rectum and into the colon. The camera sends video back to a computer screen as it moves through the colon. The colonoscope also releases carbon dioxide gas to inflate the colon. This helps your health care provider see the area better.  During  the exam, your health care provider may take a small tissue sample (biopsy) to be examined under a microscope if any abnormalities are found.  The exam is finished when the entire colon has been viewed. AFTER THE PROCEDURE   Do not drive for 24 hours after the exam.  You may have a small amount of blood in your stool.  You may pass moderate amounts of gas and have mild abdominal cramping or bloating. This is caused by the gas used to inflate your colon during the exam.  Ask when your test results will be ready and how you will get your results. Make sure you get your test results. Document Released: 12/28/1999 Document Revised: 10/20/2012 Document Reviewed: 09/06/2012 [facility] Patient Information 2015 [place], [place]. This information is not intended to replace advice given to you by your health care provider. Make sure you discuss any questions you have with your health care provider.  Patient will be contacted to schedule colonoscopy once previous colonoscopy and pathology report is obtained.

## 2014-06-01 NOTE — Progress Notes (Signed)
Patient ID: KHRIZ LIDDY, male   DOB: 09/02/1945, 69 y.o.   MRN: [id number]  Chief Complaint  Patient presents with  . Other    colonoscopy discussion    HPI Nathan Ramirez is a 69 y.o. male. Here today for colonoscopy discussion. Last colonoscopy 2011. He denies any gastrointestinal issues. Bowels move daily and no bleeding noted.   HPI  Past Medical History  Diagnosis Date  . Arthritis   . Chicken pox   . Allergy   . Elevated blood pressure reading   . GERD (gastroesophageal reflux disease)     never had EGD  . Hypercholesteremia     borderline  . Asthma     "allergic"  . Headache(784.0)     occasional tension  . PONV (postoperative nausea and vomiting)   . Colon polyp 2011?    pathology unknown  . 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 Lumbar disc surgery  2008  . Knee arthroscopy  2009& 2010    RIGHT AND LEFT KNEE  . Appendectomy  1972  . 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 Joint replacement Bilateral 2015    knee  . Colonoscopy  2011    Dr. Ernst Breach    Family History  Problem Relation Age of Onset  . Lung cancer Father   . Prostate cancer Father   . COPD Mother   . Diabetes Sister     Social History History  Substance Use Topics  . Smoking status: Former Smoker -- 40 years    Types: Cigarettes    Quit date: 01/14/1988  . Smokeless tobacco: Never Used  . Alcohol Use: 0.0 oz/week    0 Standard drinks or equivalent per week     Comment: OCCASSIONALLY    No Known Allergies  Current Outpatient Prescriptions  Medication Sig Dispense Refill  . Fluticasone-Salmeterol (ADVAIR DISKUS) 250-50 MCG/DOSE AEPB Inhale 1 puff into the lungs 2 (two) times daily. 60 each 6  . ibuprofen (ADVIL,MOTRIN) 200 MG tablet Take 200 mg by mouth every 6 (six) hours  as needed.    . lansoprazole (PREVACID) 15 MG capsule Take 15 mg by mouth daily at 12 noon.    . tiotropium (SPIRIVA) 18 MCG inhalation capsule Place 1 capsule (18 mcg total) into inhaler and inhale daily. 30 capsule 5   No current facility-administered medications for this visit.    Review of Systems Review of Systems  Constitutional: Negative.   Respiratory: Negative.   Cardiovascular: Negative.   Gastrointestinal: Negative for diarrhea, constipation and anal bleeding.    Blood pressure 108/64, pulse 70, resp. rate 16, height 5' 9.5" (1.765 m), weight 224 lb (101.606 kg).  Physical Exam Physical Exam  Constitutional: He is oriented to person, place, and time. He appears well-developed and well-nourished.  Neck: Neck supple.  Cardiovascular: Normal rate, regular rhythm and normal heart sounds.   Pulmonary/Chest: Effort normal and breath sounds normal.  Lymphadenopathy:    He has no cervical adenopathy.  Neurological: He is alert and oriented to person, place, and time.  Skin: Skin is warm and dry.    Data Reviewed Results from his 2011/2012 colonoscopy were not available as they had been completed and a standing clinic. These of been requested.  Assessment    Possible candidate for  follow-up colonoscopy.    Plan    The endoscopic and pathology reports will be reviewed. If a hyperplastic polyp was identified, repeat screening would not be indicated at this time. If an adenomatous polyp was identified he would be a candidate for repeat screening.      Colonoscopy with possible biopsy/polypectomy prn: Information regarding the procedure, including its potential risks and complications (including but not limited to perforation of the bowel, which may require emergency surgery to repair, and bleeding) was verbally given to the patient. Educational information regarding lower instestinal endoscopy was given to the patient. Written instructions for how to complete the bowel prep  using Miralax were provided. The importance of drinking ample fluids to avoid dehydration as a result of the prep emphasized.  We will try and obtain previous colonoscopy and pathology report before date for colonoscopy is arranged. Miralax prescription will be sent in once date arranged.   PCP:  Nathan Ramirez 06/03/2014, 7:12 AM

## 2014-06-03 DIAGNOSIS — Z86010 Personal history of colonic polyps: Secondary | ICD-10-CM | POA: Insufficient documentation

## 2014-06-03 DIAGNOSIS — Z1211 Encounter for screening for malignant neoplasm of colon: Secondary | ICD-10-CM | POA: Insufficient documentation

## 2014-06-08 ENCOUNTER — Encounter: Payer: Self-pay | Admitting: General Surgery

## 2014-06-08 NOTE — Progress Notes (Signed)
The patient's colonoscopy report dated 02/12/2011 was reviewed. A single polyp in the rectosigmoid was identified. Pathology showed a 3 mm hyperplastic polyp. There is no family history of colorectal cancer. The patient should plan on having a follow-up colonoscopy in 2023.

## 2014-06-08 NOTE — Progress Notes (Signed)
Patient aware he does not need colonoscopy follow up until 2023.   This patient will be placed in recalls.

## 2014-10-05 DIAGNOSIS — Z23 Encounter for immunization: Secondary | ICD-10-CM | POA: Diagnosis not present

## 2014-10-12 ENCOUNTER — Telehealth: Payer: Self-pay

## 2014-10-12 MED ORDER — BECLOMETHASONE DIPROPIONATE 80 MCG/ACT IN AERS: 2.0000 | INHALATION_SPRAY | Freq: Two times a day (BID) | RESPIRATORY_TRACT | Status: DC

## 2014-10-12 MED ORDER — IPRATROPIUM-ALBUTEROL 20-100 MCG/ACT IN AERS: 1.0000 | INHALATION_SPRAY | Freq: Four times a day (QID) | RESPIRATORY_TRACT | Status: DC

## 2014-10-12 NOTE — Telephone Encounter (Signed)
Patient's wife called, concerned that when she went to the pharmacy to pick up his meds the spirvais going to cost her $150. And the Advair is $50.  She is very upset because the medication is helping keep him well but the cost is too much.  Pharmacist checked [facility] and the cost is more with that and they have medicare Part D and so she is not eligible for the discount cards.  Do you have any suggestions?

## 2014-10-12 NOTE — Telephone Encounter (Signed)
We can also try changing the advair to Flovent, since the combivent will have the albuterol in it so he will still have the same types of medications with the new regimen,  i will print the rx's

## 2014-10-12 NOTE — Telephone Encounter (Signed)
I can try changing the spiriva to combivent ,  Which he will need to use 4 times daily .  Don't know if it is less $$$$ .

## 2014-10-12 NOTE — Addendum Note (Signed)
Addended by: Crecencio Mc on: 10/12/2014 05:20 PM   Modules accepted: Orders

## 2014-10-12 NOTE — Telephone Encounter (Signed)
Correction flovent not generic, will use qvar 2 puffs  twice daily

## 2014-10-13 NOTE — Telephone Encounter (Signed)
Attempted to call patient and wife, left message to return my call.

## 2014-10-20 ENCOUNTER — Telehealth: Payer: Self-pay

## 2014-10-20 NOTE — Telephone Encounter (Signed)
Pt wife called triage and was stating that her husband did very well on spriva, but the cost of the medication was $300. She was wondering if there was something similar that could be prescribed.

## 2014-10-20 NOTE — Telephone Encounter (Signed)
Told of the alternative and that they need to call pharmacy

## 2014-10-20 NOTE — Telephone Encounter (Signed)
Did she not get the message from over a week ago?  combivent inhaler 4 times daily is the only alternative for Spiriva, and I sent it in 9/29.  Along with QVar to replace Advair  For less $$$$

## 2014-12-20 ENCOUNTER — Telehealth: Payer: Self-pay | Admitting: Internal Medicine

## 2014-12-20 NOTE — Telephone Encounter (Signed)
Medicare will not approve Advair.  He will need to continue using QVar and Combivent

## 2014-12-20 NOTE — Telephone Encounter (Signed)
Patient notified and voiced understanding.

## 2015-02-12 ENCOUNTER — Telehealth: Payer: Self-pay | Admitting: Internal Medicine

## 2015-02-12 NOTE — Telephone Encounter (Signed)
Friday in the acute spot at 10.45

## 2015-02-12 NOTE — Telephone Encounter (Signed)
Ok. Pt is scheduled. Thank you for telling me where to sch the pt.

## 2015-02-12 NOTE — Telephone Encounter (Signed)
Pt wife called about husband psoriasis  or eczema on his lower half and then around his right leg. Looks like a burn and it's red. Pt sister has a history of eczema. Pt only wants to see his provider only. Let me know when we can work this pt in? Call wife @ [phone]. Thank you!

## 2015-02-12 NOTE — Telephone Encounter (Signed)
Nathan Ramirez is there a place we can put this pt ? Please advise

## 2015-02-16 ENCOUNTER — Ambulatory Visit (INDEPENDENT_AMBULATORY_CARE_PROVIDER_SITE_OTHER): Payer: Medicare Other | Admitting: Internal Medicine

## 2015-02-16 ENCOUNTER — Encounter: Payer: Self-pay | Admitting: Internal Medicine

## 2015-02-16 VITALS — BP 112/70 | HR 53 | Temp 97.5°F | Resp 12 | Ht 70.0 in | Wt 224.5 lb

## 2015-02-16 DIAGNOSIS — L400 Psoriasis vulgaris: Secondary | ICD-10-CM | POA: Diagnosis not present

## 2015-02-16 MED ORDER — BETAMETHASONE DIPROPIONATE 0.05 % EX LOTN: TOPICAL_LOTION | Freq: Two times a day (BID) | CUTANEOUS | Status: DC

## 2015-02-16 MED ORDER — TAZAROTENE 0.05 % EX GEL: CUTANEOUS | Status: DC

## 2015-02-16 NOTE — Progress Notes (Signed)
Pre-visit discussion using our clinic review tool. No additional management support is needed unless otherwise documented below in the visit note.  

## 2015-02-16 NOTE — Progress Notes (Signed)
Subjective:  Patient ID: Nathan Ramirez, male    DOB: Mar 20, 1945  Age: 70 y.o. MRN: [id number]  CC: The encounter diagnosis was Plaque psoriasis.  HPI Nathan Ramirez presents for SCALING RASH ON RIGHT LOWER LEG.  The rash appeared one month ago,  wuarter sized scaling macular rash  Another smaller one above it. Not itchy,  Has been using hyrdocortisone 1% ,  No change.   No other spots.  No recent changes in  Soaps , lotions.  No contact with small children.  No hot tub or sauna use.  Denies fevers,  Lymphadenopathy.   Outpatient Prescriptions Prior to Visit  Medication Sig Dispense Refill  . beclomethasone (QVAR) 80 MCG/ACT inhaler Inhale 2 puffs into the lungs 2 (two) times daily. 1 Inhaler 12  . ibuprofen (ADVIL,MOTRIN) 200 MG tablet Take 200 mg by mouth every 6 (six) hours as needed.    . Ipratropium-Albuterol (COMBIVENT) 20-100 MCG/ACT AERS respimat Inhale 1 puff into the lungs every 6 (six) hours. 4 g 3  . lansoprazole (PREVACID) 15 MG capsule Take 15 mg by mouth daily at 12 noon.     No facility-administered medications prior to visit.    Review of Systems;  Patient denies headache, fevers, malaise, unintentional weight loss,  eye pain, sinus congestion and sinus pain, sore throat, dysphagia,  hemoptysis , cough, dyspnea, wheezing, chest pain, palpitations, orthopnea, edema, abdominal pain, nausea, melena, diarrhea, constipation, flank pain, dysuria, hematuria, urinary  Frequency, nocturia, numbness, tingling, seizures,  Focal weakness, Loss of consciousness,  Tremor, insomnia, depression, anxiety, and suicidal ideation.      Objective:  BP 112/70 mmHg  Pulse 53  Temp(Src) 97.5 F (36.4 C) (Oral)  Resp 12  Ht 5\' 10"  (1.778 m)  Wt 224 lb 8 oz (101.833 kg)  BMI 32.21 kg/m2  SpO2 94%  BP Readings from Last 3 Encounters:  02/16/15 112/70  06/01/14 108/64  05/18/14 118/78    Wt Readings from Last 3 Encounters:  02/16/15 224 lb 8 oz (101.833 kg)  06/01/14 224 lb  (101.606 kg)  05/18/14 223 lb 8 oz (101.379 kg)    General appearance: alert, cooperative and appears stated age  symmetrical, trachea midline and thyroid not enlarged, symmetric, no tenderness/mass/nod Back: symmetric, no curvature. ROM normal. No CVA tenderness. Lungs: clear to auscultation bilaterally Heart: regular rate and rhythm, S1, S2 normal, no murmur, click, rub or gallop Abdomen: soft, non-tender; bowel sounds normal; no masses,  no organomegaly Pulses: 2+ and symmetric Skin: half dollar sized scaling macular placque , dime sized similar lesion right lower leg  Lymph nodes: Cervical, supraclavicular, and axillary nodes normal.  Lab Results  Component Value Date   HGBA1C 5.9 05/18/2014    Lab Results  Component Value Date   CREATININE 1.02 05/18/2014   CREATININE 0.95 12/08/2012   CREATININE 1.00 12/07/2012    Lab Results  Component Value Date   WBC 7.5 05/18/2014   HGB 15.6 05/18/2014   HCT 43.8 05/18/2014   PLT 296.0 05/18/2014   GLUCOSE 95 05/18/2014   CHOL 175 05/18/2014   TRIG 86.0 05/18/2014   HDL 42.90 05/18/2014   LDLDIRECT 161.8 10/15/2011   LDLCALC 115* 05/18/2014   ALT 53 05/18/2014   AST 32 05/18/2014   NA 136 05/18/2014   K 4.4 05/18/2014   CL 104 05/18/2014   CREATININE 1.02 05/18/2014   BUN 15 05/18/2014   CO2 26 05/18/2014   TSH 1.00 05/18/2014   PSA 0.42 05/18/2014   INR  2.1* 12/27/2012   HGBA1C 5.9 05/18/2014    No results found.  Assessment & Plan:   Problem List Items Addressed This Visit    Plaque psoriasis - Primary    Right lower leg.  tazorac and betamethasone dipropionate prescribed.          I am having Mr. Hostetler start on tazarotene and betamethasone dipropionate. I am also having him maintain his ibuprofen, lansoprazole, Ipratropium-Albuterol, beclomethasone, and ADVAIR DISKUS.  Meds ordered this encounter  Medications  . ADVAIR DISKUS 250-50 MCG/DOSE AEPB    Sig: Inhale 1 puff into the lungs 2 (two) times  daily.  . tazarotene (TAZORAC) 0.05 % gel    Sig: APPLY CAREFULLY TO PLACQUE ONCE DAILY BEFORE BED    Dispense:  30 g    Refill:  1  . betamethasone dipropionate 0.05 % lotion    Sig: Apply topically 2 (two) times daily. TO IRRITATED AREA    Dispense:  60 mL    Refill:  0    There are no discontinued medications.  Follow-up: No Follow-up on file.   Crecencio Mc, MD

## 2015-02-16 NOTE — Patient Instructions (Signed)
PPsoriasis Psoriasis is a long-term (chronic) condition of skin inflammation. It occurs because your immune system causes skin cells to form too quickly. As a result, too many skin cells grow and create raised, red patches (plaques) that look silvery on your skin. Plaques may appear anywhere on your body. They can be any size or shape. Psoriasis can come and go. The condition varies from mild to very severe. It cannot be passed from one person to another (not contagious).  CAUSES  The cause of psoriasis is not known, but certain factors can make the condition worse. These include:   Damage or trauma to the skin, such as cuts, scrapes, sunburn, and dryness.  Lack of sunlight.  Certain medicines.  Alcohol.  Tobacco use.  Stress.  Infections caused by bacteria or viruses. RISK FACTORS This condition is more likely to develop in:  People with a family history of psoriasis.  People who are Caucasian.  People who are between the ages of 15-70 and 73-28 years old. SYMPTOMS  There are five different types of psoriasis. You can have more than one type of psoriasis during your life. Types are:   Plaque.  Guttate.  Inverse.  Pustular.  Erythrodermic. Each type of psoriasis has different symptoms.   Plaque psoriasis symptoms include red, raised plaques with a silvery white coating (scale). These plaques may be itchy. Your nails may be pitted and crumbly or fall off.  Guttate psoriasis symptoms include small red spots that often show up on your trunk, arms, and legs. These spots may develop after you have been sick, especially with strep throat.  Inverse psoriasis symptoms include plaques in your underarm area, under your breasts, or on your genitals, groin, or buttocks.  Pustular psoriasis symptoms include pus-filled bumps that are painful, red, and swollen on the palms of your hands or the soles of your feet. You also may feel exhausted, feverish, weak, or have no  appetite.  Erythrodermic psoriasis symptoms include bright red skin that may look burned. You may have a fast heartbeat and a body temperature that is too high or too low. You may be itchy or in pain. DIAGNOSIS  Your health care provider may suspect psoriasis based on your symptoms and family history. Your health care provider will also do a physical exam. This may include a procedure to remove a tissue sample (biopsy) for testing. You may also be referred to a health care provider who specializes in skin diseases (dermatologist).  TREATMENT There is no cure for this condition, but treatment can help manage it. Goals of treatment include:   Helping your skin heal.  Reducing itching and inflammation.  Slowing the growth of new skin cells.  Helping your immune system respond better to your skin. Treatment varies, depending on the severity of your condition. Treatment may include:   Creams or ointments.  Ultraviolet ray exposure (light therapy). This may include natural sunlight or light therapy in a medical office.  Medicines (systemic therapy). These medicines can help your body better manage skin cell turnover and inflammation. They may be used along with light therapy or ointments. You may also get antibiotic medicines if you have an infection. HOME CARE INSTRUCTIONS Skin Care  Moisturize your skin as needed. Only use moisturizers that have been approved by your health care provider.   Apply cool compresses to the affected areas.   Do not scratch your skin.  Lifestyle  Do not use tobacco products. This includes cigarettes, chewing tobacco, and e-cigarettes. If you  need help quitting, ask your health care provider.  Drink little or no alcohol.   Try techniques for stress reduction, such as meditation or yoga.  Get exposure to the sun as told by your health care provider. Do not get sunburned.   Consider joining a psoriasis support group.  Medicines  Take or use  over-the-counter and prescription medicines only as told by your health care provider.  If you were prescribed an antibiotic, take or use it as told by your health care provider. Do not stop taking the antibiotic even if your condition starts to improve. General Instructions  Keep a journal to help track what triggers an outbreak. Try to avoid any triggers.   See a counselor or social worker if feelings of sadness, frustration, and hopelessness about your condition are interfering with your work and relationships.  Keep all follow-up visits as told by your health care provider. This is important. SEEK MEDICAL CARE IF:  Your pain gets worse.  You have increasing redness or warmth in the affected areas.   You have new or worsening pain or stiffness in your joints.  Your nails start to break easily or pull away from the nail bed.   You have a fever.   You feel depressed.   This information is not intended to replace advice given to you by your health care provider. Make sure you discuss any questions you have with your health care provider.   Document Released: 12/28/1999 Document Revised: 09/20/2014 Document Reviewed: 05/17/2014 Elsevier Interactive Patient Education 2016 [employer]  

## 2015-02-18 NOTE — Assessment & Plan Note (Signed)
Right lower leg.  tazorac and betamethasone dipropionate prescribed.

## 2015-02-19 ENCOUNTER — Telehealth: Payer: Self-pay | Admitting: *Deleted

## 2015-02-19 MED ORDER — ADVAIR DISKUS 250-50 MCG/DOSE IN AEPB: 1.0000 | INHALATION_SPRAY | Freq: Two times a day (BID) | RESPIRATORY_TRACT | Status: DC

## 2015-02-19 NOTE — Telephone Encounter (Signed)
Patient requested a medication refill for ADVAIR DISKUS 250-50 MCG/DOSE [place] at [place].

## 2015-02-20 ENCOUNTER — Other Ambulatory Visit: Payer: Self-pay

## 2015-02-20 MED ORDER — ADVAIR DISKUS 250-50 MCG/DOSE IN AEPB: 1.0000 | INHALATION_SPRAY | Freq: Two times a day (BID) | RESPIRATORY_TRACT | Status: DC

## 2015-04-05 ENCOUNTER — Ambulatory Visit (INDEPENDENT_AMBULATORY_CARE_PROVIDER_SITE_OTHER): Payer: Medicare Other | Admitting: Family Medicine

## 2015-04-05 ENCOUNTER — Encounter: Payer: Self-pay | Admitting: Family Medicine

## 2015-04-05 DIAGNOSIS — J020 Streptococcal pharyngitis: Secondary | ICD-10-CM | POA: Diagnosis not present

## 2015-04-05 DIAGNOSIS — H109 Unspecified conjunctivitis: Secondary | ICD-10-CM | POA: Diagnosis not present

## 2015-04-05 DIAGNOSIS — B9789 Other viral agents as the cause of diseases classified elsewhere: Principal | ICD-10-CM

## 2015-04-05 DIAGNOSIS — J069 Acute upper respiratory infection, unspecified: Secondary | ICD-10-CM

## 2015-04-05 LAB — POCT RAPID STREP A (OFFICE): Rapid Strep A Screen: NEGATIVE

## 2015-04-05 MED ORDER — CIPROFLOXACIN HCL 0.3 % OP SOLN: 2.0000 [drp] | Freq: Four times a day (QID) | OPHTHALMIC | Status: DC

## 2015-04-05 NOTE — Progress Notes (Signed)
Pre visit review using our clinic review tool, if applicable. No additional management support is needed unless otherwise documented below in the visit note. 

## 2015-04-05 NOTE — Patient Instructions (Addendum)
This is appears to be viral.  However, given the severity of the swelling and redness of your eye, I am treating you with a topical antibiotic (to ensure their is no bacterial component).  The respiratory symptoms that you have (Sore throat, congestion, etc) does not appear to be bacterial in origin. Strep test was negative.  Use tylenol/ibuprofen for pain/discomfort. Continue supportive care at home.  Take care  Dr. Lacinda Axon

## 2015-04-05 NOTE — Progress Notes (Signed)
Subjective:  Patient ID: Nathan Ramirez, male    DOB: 08/09/1945  Age: 70 y.o. MRN: [id number]  CC: ST, cough, congestion, red/swollen R eye  HPI:  70 year old male presents to clinic today for an acute visit with the above complaints.  Patient states that he's been sick since Tuesday. He's been experiencing sore throat, cold symptoms, cough, congestion. Additionally, he has been experiencing right eye redness and swelling. He states that his vision is intact. No visual disturbance. No associated fevers or chills. He's been taking over-the-counter medication with no resolution in his symptoms. No known exacerbating factors.  Social Hx   Social History   Social History  . Marital Status: Married    Spouse Name: N/A  . Number of Children: N/A  . Years of Education: N/A   Social History Main Topics  . Smoking status: Former Smoker -- 40 years    Types: Cigarettes    Quit date: 01/14/1988  . Smokeless tobacco: Never Used  . Alcohol Use: 0.0 oz/week    0 Standard drinks or equivalent per week     Comment: OCCASSIONALLY  . Drug Use: No  . Sexual Activity: Not Asked   Other Topics Concern  . None   Social History Narrative   Review of Systems  Constitutional: Negative for fever.  HENT: Positive for congestion and sore throat.   Respiratory: Positive for cough.    Objective:  BP 118/64 mmHg  Pulse 95  Temp(Src) 98.3 F (36.8 C) (Oral)  Wt 230 lb (104.327 kg)  SpO2 94%  BP/Weight 04/05/2015 02/16/2015 [id number]  Systolic BP 123456 [id number]  Diastolic BP 64 70 64  Wt. (Lbs) 230 224.5 224  BMI 33 32.21 32.62   Physical Exam  Constitutional: He is oriented to person, place, and time. He appears well-developed. No distress.  HENT:  Head: Normocephalic and atraumatic.  Oropharynx with mild erythema. No exudate.   Eyes:  Clear discharge from the right eye and severe conjunctival injection.   Cardiovascular: Regular rhythm.   No murmur heard. Pulmonary/Chest: Effort  normal and breath sounds normal. No respiratory distress. He has no wheezes. He has no rales.  Neurological: He is alert and oriented to person, place, and time.  Vitals reviewed.   Lab Results  Component Value Date   WBC 7.5 05/18/2014   HGB 15.6 05/18/2014   HCT 43.8 05/18/2014   PLT 296.0 05/18/2014   GLUCOSE 95 05/18/2014   CHOL 175 05/18/2014   TRIG 86.0 05/18/2014   HDL 42.90 05/18/2014   LDLDIRECT 161.8 10/15/2011   LDLCALC 115* 05/18/2014   ALT 53 05/18/2014   AST 32 05/18/2014   NA 136 05/18/2014   K 4.4 05/18/2014   CL 104 05/18/2014   CREATININE 1.02 05/18/2014   BUN 15 05/18/2014   CO2 26 05/18/2014   TSH 1.00 05/18/2014   PSA 0.42 05/18/2014   INR 2.1* 12/27/2012   HGBA1C 5.9 05/18/2014    Assessment & Plan:   Problem List Items Addressed This Visit    Viral URI with cough    New problem. Rapid strep negative today. This is likely viral in origin given history and physical exam. Advised supportive care and over-the-counter ibuprofen.      Relevant Orders   POCT rapid strep A   Culture, Group A Strep   Conjunctivitis    New problem. This is also likely viral in origin. However, patient has severe conjunctival injection and has had drainage. He also has some  swelling. Given physical exam findings treating empirically for bacterial conjunctivitis with Cipro.      Relevant Medications   ciprofloxacin (CILOXAN) 0.3 % ophthalmic solution      Meds ordered this encounter  Medications  . ciprofloxacin (CILOXAN) 0.3 % ophthalmic solution    Sig: Place 2 drops into the right eye 4 (four) times daily. For 7 days.    Dispense:  10 mL    Refill:  0   Follow-up: PRN  [place]

## 2015-04-05 NOTE — Assessment & Plan Note (Signed)
New problem. This is also likely viral in origin. However, patient has severe conjunctival injection and has had drainage. He also has some swelling. Given physical exam findings treating empirically for bacterial conjunctivitis with Cipro.

## 2015-04-05 NOTE — Assessment & Plan Note (Signed)
New problem. Rapid strep negative today. This is likely viral in origin given history and physical exam. Advised supportive care and over-the-counter ibuprofen.

## 2015-04-06 ENCOUNTER — Other Ambulatory Visit: Payer: Self-pay

## 2015-04-06 ENCOUNTER — Other Ambulatory Visit: Payer: Self-pay | Admitting: Family Medicine

## 2015-04-06 DIAGNOSIS — H109 Unspecified conjunctivitis: Secondary | ICD-10-CM

## 2015-04-06 MED ORDER — CIPROFLOXACIN HCL 0.3 % OP SOLN: 2.0000 [drp] | Freq: Four times a day (QID) | OPHTHALMIC | Status: DC

## 2015-04-06 NOTE — Telephone Encounter (Signed)
Please advise for refill on this. thanks

## 2015-04-06 NOTE — Telephone Encounter (Signed)
I just prescribed this (earlier this week). He should not need a refill.

## 2015-04-07 LAB — CULTURE, GROUP A STREP: Organism ID, Bacteria: NORMAL

## 2015-04-19 DIAGNOSIS — L859 Epidermal thickening, unspecified: Secondary | ICD-10-CM | POA: Diagnosis not present

## 2015-04-19 DIAGNOSIS — Z86018 Personal history of other benign neoplasm: Secondary | ICD-10-CM | POA: Diagnosis not present

## 2015-07-03 ENCOUNTER — Telehealth: Payer: Self-pay | Admitting: Internal Medicine

## 2015-07-03 NOTE — Telephone Encounter (Signed)
Pt wife called needing a prescription for a antiflammatory  for arthritis.  Pharmacy is  [facility] [address] [place]  Call pt @ [phone]. Thank you!

## 2015-07-03 NOTE — Telephone Encounter (Signed)
Please advise. This may be a repeat.

## 2015-07-03 NOTE — Telephone Encounter (Signed)
Patient taking more than 9 advil per day for bilateral knee replacements. Patient is working 40 hour per week. Tylenol does not work. Have scheduled  Him for Monday Night  ,his wife is hoping he can get something til appointment.

## 2015-07-04 MED ORDER — MELOXICAM 15 MG PO TABS: 15.0000 mg | ORAL_TABLET | Freq: Every day | ORAL | Status: DC

## 2015-07-04 NOTE — Telephone Encounter (Signed)
Called in meloxicam 15 mg daily,  Continue tylenol up to 2000 mg daily

## 2015-07-04 NOTE — Telephone Encounter (Signed)
Left detailed message for patient.

## 2015-07-09 ENCOUNTER — Ambulatory Visit (INDEPENDENT_AMBULATORY_CARE_PROVIDER_SITE_OTHER): Payer: Medicare Other | Admitting: Internal Medicine

## 2015-07-09 ENCOUNTER — Encounter: Payer: Self-pay | Admitting: Internal Medicine

## 2015-07-09 VITALS — BP 136/76 | HR 70 | Temp 97.9°F | Resp 12 | Ht 70.0 in | Wt 233.2 lb

## 2015-07-09 DIAGNOSIS — M064 Inflammatory polyarthropathy: Secondary | ICD-10-CM

## 2015-07-09 DIAGNOSIS — Z79899 Other long term (current) drug therapy: Secondary | ICD-10-CM | POA: Diagnosis not present

## 2015-07-09 DIAGNOSIS — L400 Psoriasis vulgaris: Secondary | ICD-10-CM

## 2015-07-09 DIAGNOSIS — Z86018 Personal history of other benign neoplasm: Secondary | ICD-10-CM | POA: Diagnosis not present

## 2015-07-09 DIAGNOSIS — R5383 Other fatigue: Secondary | ICD-10-CM

## 2015-07-09 DIAGNOSIS — E785 Hyperlipidemia, unspecified: Secondary | ICD-10-CM | POA: Diagnosis not present

## 2015-07-09 DIAGNOSIS — M130 Polyarthritis, unspecified: Secondary | ICD-10-CM

## 2015-07-09 DIAGNOSIS — E669 Obesity, unspecified: Secondary | ICD-10-CM

## 2015-07-09 DIAGNOSIS — L821 Other seborrheic keratosis: Secondary | ICD-10-CM | POA: Diagnosis not present

## 2015-07-09 DIAGNOSIS — L309 Dermatitis, unspecified: Secondary | ICD-10-CM | POA: Diagnosis not present

## 2015-07-09 DIAGNOSIS — D225 Melanocytic nevi of trunk: Secondary | ICD-10-CM | POA: Diagnosis not present

## 2015-07-09 NOTE — Progress Notes (Signed)
Pre-visit discussion using our clinic review tool. No additional management support is needed unless otherwise documented below in the visit note.  

## 2015-07-09 NOTE — Progress Notes (Signed)
Subjective:  Patient ID: Nathan Ramirez, male    DOB: [birth year]  Age: 70 y.o. MRN: [id number]  CC: The primary encounter diagnosis was Polyarthritis. Diagnoses of Hyperlipidemia, Long-term use of high-risk medication, Other fatigue, Polyarthritis of multiple sites ([place]), Plaque psoriasis, and Obesity (BMI 30.0-34.9) were also pertinent to this visit.  HPI Nathan Ramirez presents for follow up on multiple issues  1) eczema of palms.  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 He has developed painful fissures in the palmar surface and Saw Dr Tonia Brooms   dermatologist  Today for checkup.  He was  given urea cream but has not started it yet . has been using superglue to seal the cracks to prevent infection  .  .  Joint pain:  Ankles, hips and metacarpals of both hands  Wife  worried about using too much ibuprofen on a daily basis,  Averaging nine a day but stopped a week ad a half ago   Works 12 hour shifts 5 days  Vs 3 days days per week. Alternating schedule.  .    Outpatient Prescriptions Prior to Visit  Medication Sig Dispense Refill  . ADVAIR DISKUS 250-50 MCG/DOSE AEPB Inhale 1 puff into the lungs 2 (two) times daily. 60 each 3  . lansoprazole (PREVACID) 15 MG capsule Take 15 mg by mouth daily at 12 noon.    . meloxicam (MOBIC) 15 MG tablet Take 1 tablet (15 mg total) by mouth daily. 30 tablet 0  . Ipratropium-Albuterol (COMBIVENT) 20-100 MCG/ACT AERS respimat Inhale 1 puff into the lungs every 6 (six) hours. (Patient not taking: Reported on 07/09/2015) 4 g 3  . beclomethasone (QVAR) 80 MCG/ACT inhaler Inhale 2 puffs into the lungs 2 (two) times daily. (Patient not taking: Reported on 07/09/2015) 1 Inhaler 12  . betamethasone dipropionate 0.05 % lotion Apply topically 2 (two) times daily. TO IRRITATED AREA 60 mL 0  . ciprofloxacin (CILOXAN) 0.3 % ophthalmic solution Place 2 drops into both eyes 4  (four) times daily. For 7 days. 10 mL 0  . ibuprofen (ADVIL,MOTRIN) 200 MG tablet Take 200 mg by mouth every 6 (six) hours as needed.    . tazarotene (TAZORAC) 0.05 % gel APPLY CAREFULLY TO PLACQUE ONCE DAILY BEFORE BED 30 g 1   No facility-administered medications prior to visit.    Review of Systems;  Patient denies headache, fevers, malaise, unintentional weight loss, skin rash, eye pain, sinus congestion and sinus pain, sore throat, dysphagia,  hemoptysis , cough, dyspnea, wheezing, chest pain, palpitations, orthopnea, edema, abdominal pain, nausea, melena, diarrhea, constipation, flank pain, dysuria, hematuria, urinary  Frequency, nocturia, numbness, tingling, seizures,  Focal weakness, Loss of consciousness,  Tremor, insomnia, depression, anxiety, and suicidal ideation.      Objective:  BP 136/76 mmHg  Pulse 70  Temp(Src) 97.9 F (36.6 C) (Oral)  Resp 12  Ht '5\' 10"'  (1.778 m)  Wt 233 lb 4 oz (105.802 kg)  BMI 33.47 kg/m2  SpO2 95%  BP Readings from Last 3 Encounters:  07/09/15 136/76  04/05/15 118/64  02/16/15 112/70    Wt Readings from Last 3 Encounters:  07/09/15 233 lb 4 oz (105.802 kg)  04/05/15 230 lb (104.327 kg)  02/16/15 224 lb 8 oz (101.833 kg)    General appearance: alert, cooperative and appears stated age Ears: normal TM's and external ear canals both ears Throat: lips, mucosa,  and tongue normal; teeth and gums normal Neck: no adenopathy, no carotid bruit, supple, symmetrical, trachea midline and thyroid not enlarged, symmetric, no tenderness/mass/nodules Back: symmetric, no curvature. ROM normal. No CVA tenderness. Lungs: clear to auscultation bilaterally Heart: regular rate and rhythm, S1, S2 normal, no murmur, click, rub or gallop Abdomen: soft, non-tender; bowel sounds normal; no masses,  no organomegaly Pulses: 2+ and symmetric Skin: Skin color, texture, turgor normal. No rashes or lesions Lymph nodes: Cervical, supraclavicular, and axillary nodes  normal.  Lab Results  Component Value Date   HGBA1C 5.9 05/18/2014    Lab Results  Component Value Date   CREATININE 1.17 07/09/2015   CREATININE 1.02 05/18/2014   CREATININE 0.95 12/08/2012    Lab Results  Component Value Date   WBC 7.2 07/09/2015   HGB 14.8 07/09/2015   HCT 44.3 07/09/2015   PLT 247.0 07/09/2015   GLUCOSE 103* 07/09/2015   CHOL 207* 07/09/2015   TRIG 101.0 07/09/2015   HDL 55.20 07/09/2015   LDLDIRECT 161.8 10/15/2011   LDLCALC 132* 07/09/2015   ALT 27 07/09/2015   AST 25 07/09/2015   NA 138 07/09/2015   K 4.6 07/09/2015   CL 108 07/09/2015   CREATININE 1.17 07/09/2015   BUN 13 07/09/2015   CO2 27 07/09/2015   TSH 1.35 07/09/2015   PSA 0.42 05/18/2014   INR 2.1* 12/27/2012   HGBA1C 5.9 05/18/2014    No results found.  Assessment & Plan:   Problem List Items Addressed This Visit    Obesity (BMI 30.0-34.9)    I have addressed  BMI and recommended wt loss of 1s lbs by his next visit in 3 months using a low glycemic index diet and regular exercise a minimum of 5 days per week.        Plaque psoriasis    The medications seemed to have helped his leg but not his hands at all.  Seeing dermatology for alternatives to the creams      Polyarthritis of multiple sites [facility])    Consider Psoriatic arthritis given concurrent psoriatic condition of hands. ESR is normal. Trial of meloxicam and tylenol   Lab Results  Component Value Date   ESRSEDRATE 7 07/09/2015         Other Visit Diagnoses    Polyarthritis    -  Primary    Relevant Orders    Sedimentation rate (Completed)    Uric acid (Completed)    Rheumatoid factor (Completed)    ANA w/Reflex if Positive    Hyperlipidemia        Relevant Orders    Lipid panel (Completed)    Long-term use of high-risk medication        Relevant Orders    Comprehensive metabolic panel (Completed)    Other fatigue        Relevant Orders    TSH (Completed)    CBC with Differential/Platelet (Completed)        I have discontinued Mr. Gift ibuprofen, beclomethasone, tazarotene, betamethasone dipropionate, and ciprofloxacin. I am also having him maintain his lansoprazole, Ipratropium-Albuterol, ADVAIR DISKUS, meloxicam, and Urea.  Meds ordered this encounter  Medications  . Urea (UTOPIC) 41 % CREA    Sig: Apply 1 application topically daily.    Medications Discontinued During This Encounter  Medication Reason  . beclomethasone (QVAR) 80 MCG/ACT inhaler Change in therapy  . betamethasone dipropionate 0.05 % lotion Change in therapy  . ciprofloxacin (CILOXAN) 0.3 % ophthalmic solution Completed Course  . ibuprofen (  ADVIL,MOTRIN) 200 MG tablet Change in therapy  . tazarotene (TAZORAC) 0.05 % gel Completed Course    Follow-up: Return in about 3 months (around 10/09/2015) for annueal wellness .   Crecencio Mc, MD

## 2015-07-09 NOTE — Patient Instructions (Signed)
You can continue taking meloxicam daily for arthritis pain,  Ok to add tylenol but NOT MOTRIN  I WANT YOU TO LOSE 12 LBS OVER THE NEXT 3 MONTHS  , BY YOUR PHYSICAL;

## 2015-07-10 DIAGNOSIS — M130 Polyarthritis, unspecified: Secondary | ICD-10-CM | POA: Insufficient documentation

## 2015-07-10 LAB — COMPREHENSIVE METABOLIC PANEL
ALT: 27 U/L (ref 0–53)
AST: 25 U/L (ref 0–37)
Albumin: 4.5 g/dL (ref 3.5–5.2)
Alkaline Phosphatase: 66 U/L (ref 39–117)
BUN: 13 mg/dL (ref 6–23)
CO2: 27 mEq/L (ref 19–32)
Calcium: 9.8 mg/dL (ref 8.4–10.5)
Chloride: 108 mEq/L (ref 96–112)
Creatinine, Ser: 1.17 mg/dL (ref 0.40–1.50)
GFR: 65.92 mL/min (ref >60.00)
Glucose, Bld: 103 mg/dL — ABNORMAL HIGH (ref 70–99)
Potassium: 4.6 mEq/L (ref 3.5–5.1)
Sodium: 138 mEq/L (ref 135–145)
Total Bilirubin: 0.6 mg/dL (ref 0.2–1.2)
Total Protein: 7.4 g/dL (ref 6.0–8.3)

## 2015-07-10 LAB — LIPID PANEL
Cholesterol: 207 mg/dL — ABNORMAL HIGH (ref 0–200)
HDL: 55.2 mg/dL (ref >39.00)
LDL Cholesterol: 132 mg/dL — ABNORMAL HIGH (ref 0–99)
NonHDL: 151.93
Total CHOL/HDL Ratio: 4
Triglycerides: 101 mg/dL (ref 0.0–149.0)
VLDL: 20.2 mg/dL (ref 0.0–40.0)

## 2015-07-10 LAB — CBC WITH DIFFERENTIAL/PLATELET
Basophils Absolute: 0.1 10*3/uL (ref 0.0–0.1)
Basophils Relative: 0.8 % (ref 0.0–3.0)
Eosinophils Absolute: 0.3 10*3/uL (ref 0.0–0.7)
Eosinophils Relative: 4.2 % (ref 0.0–5.0)
HCT: 44.3 % (ref 39.0–52.0)
Hemoglobin: 14.8 g/dL (ref 13.0–17.0)
Lymphocytes Relative: 23.8 % (ref 12.0–46.0)
Lymphs Abs: 1.7 10*3/uL (ref 0.7–4.0)
MCHC: 33.3 g/dL (ref 30.0–36.0)
MCV: 96.9 fl (ref 78.0–100.0)
Monocytes Absolute: 0.6 10*3/uL (ref 0.1–1.0)
Monocytes Relative: 8.3 % (ref 3.0–12.0)
Neutro Abs: 4.5 10*3/uL (ref 1.4–7.7)
Neutrophils Relative %: 62.9 % (ref 43.0–77.0)
Platelets: 247 10*3/uL (ref 150.0–400.0)
RBC: 4.57 Mil/uL (ref 4.22–5.81)
RDW: 14 % (ref 11.5–15.5)
WBC: 7.2 10*3/uL (ref 4.0–10.5)

## 2015-07-10 LAB — TSH: TSH: 1.35 u[IU]/mL (ref 0.35–4.50)

## 2015-07-10 LAB — URIC ACID: Uric Acid, Serum: 6.7 mg/dL (ref 4.0–7.8)

## 2015-07-10 LAB — SEDIMENTATION RATE: Sed Rate: 7 mm/hr (ref 0–20)

## 2015-07-10 LAB — RHEUMATOID FACTOR: Rhuematoid fact SerPl-aCnc: <10 IU/mL (ref <=14)

## 2015-07-10 NOTE — Assessment & Plan Note (Addendum)
I have addressed  BMI and recommended wt loss of 1s lbs by his next visit in 3 months using a low glycemic index diet and regular exercise a minimum of 5 days per week.

## 2015-07-10 NOTE — Assessment & Plan Note (Signed)
The medications seemed to have helped his leg but not his hands at all.  Seeing dermatology for alternatives to the creams

## 2015-07-10 NOTE — Assessment & Plan Note (Addendum)
Consider Psoriatic arthritis given concurrent psoriatic condition of hands. ESR is normal. Trial of meloxicam and tylenol   Lab Results  Component Value Date   ESRSEDRATE 7 07/09/2015

## 2015-07-11 LAB — ANA W/REFLEX IF POSITIVE: Anti Nuclear Antibody(ANA): NEGATIVE

## 2015-07-18 NOTE — Addendum Note (Signed)
Addended by: Nanci Pina on: 07/18/2015 10:25 AM   Modules accepted: Medications, SmartSet

## 2015-07-27 IMAGING — CR DG CHEST 2V
2 series · 2 of 2 positions shown · non-contrast
Comparison: None.

CLINICAL DATA: Preop bilateral knee replacements

EXAM:
CHEST  2 VIEW

[w chest pa]
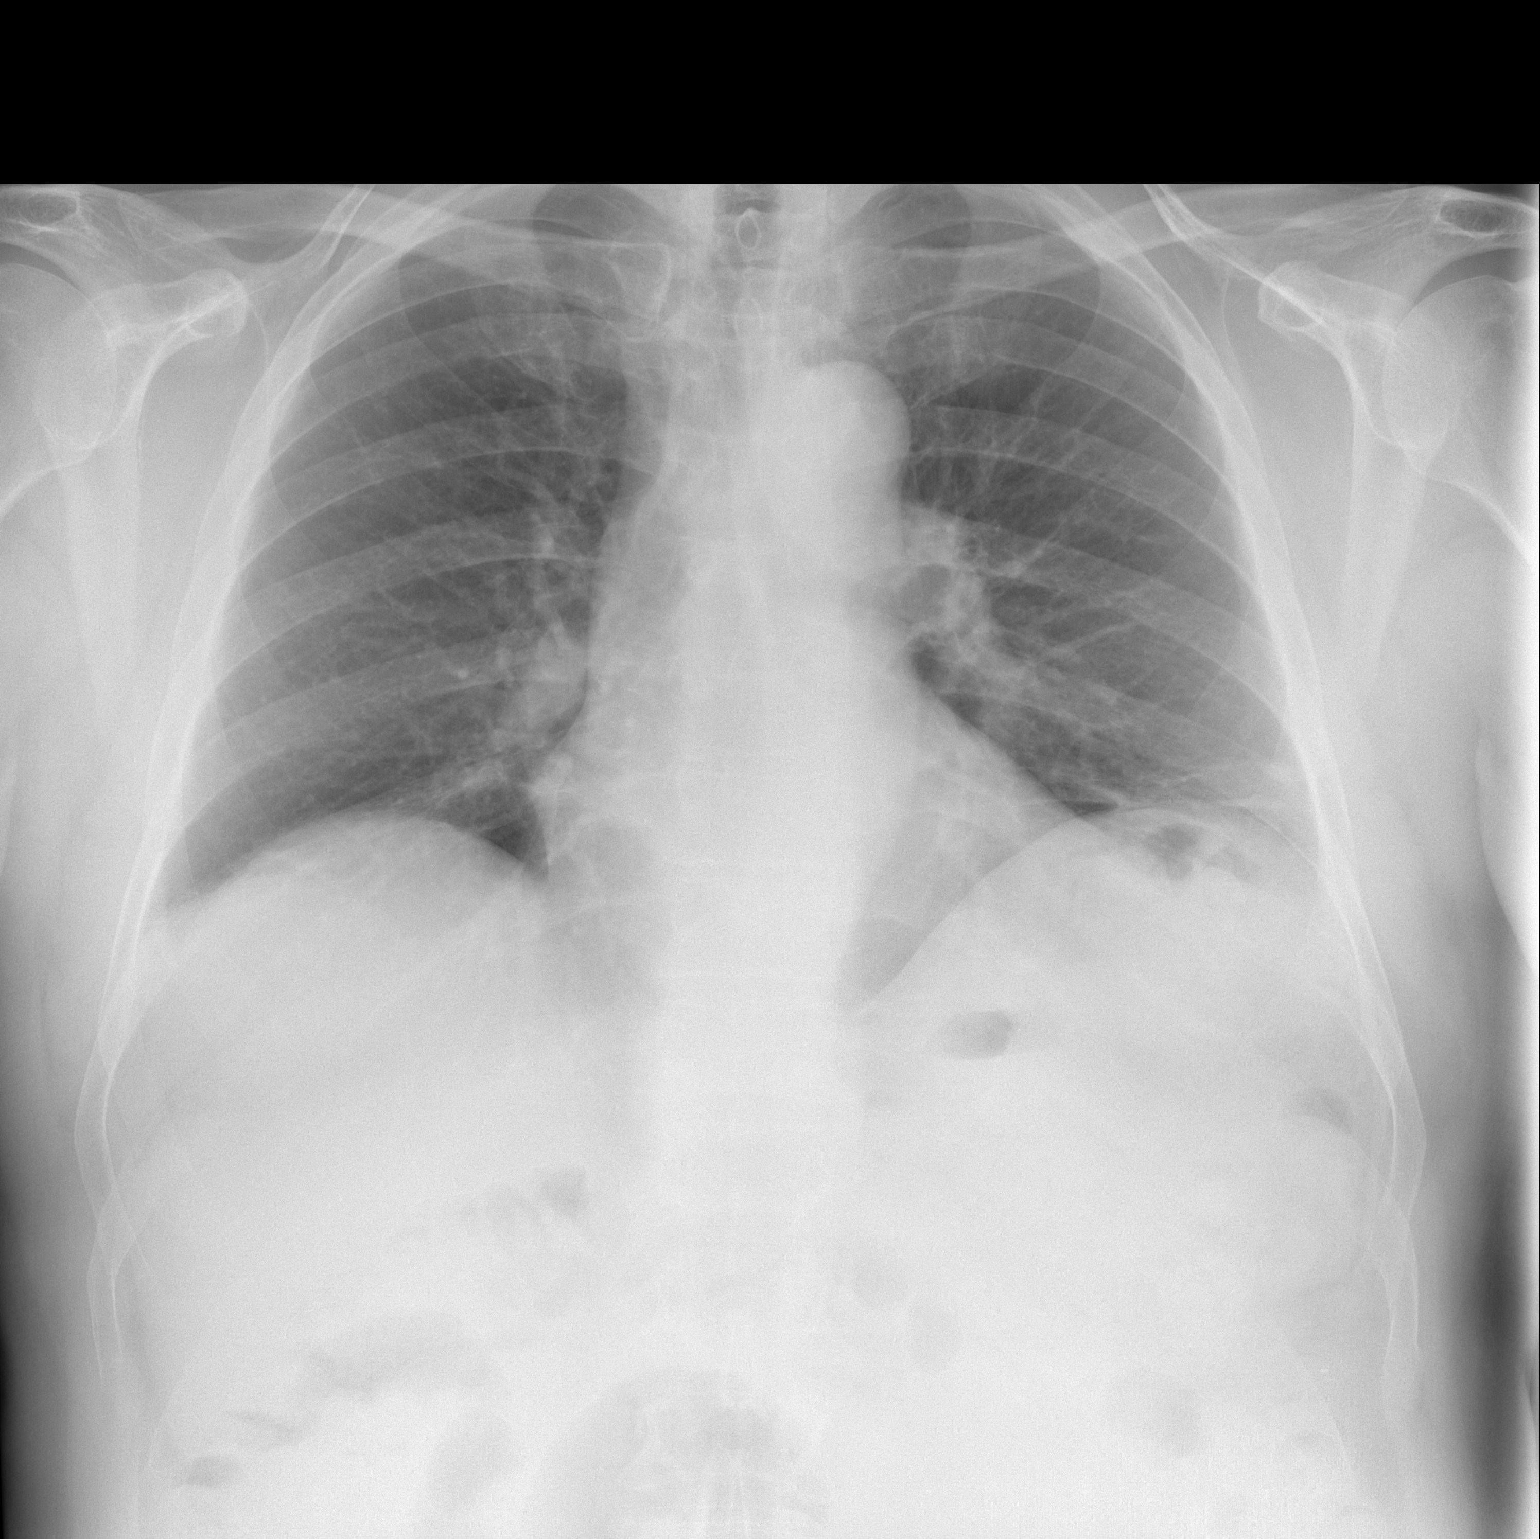

[w chest lat]
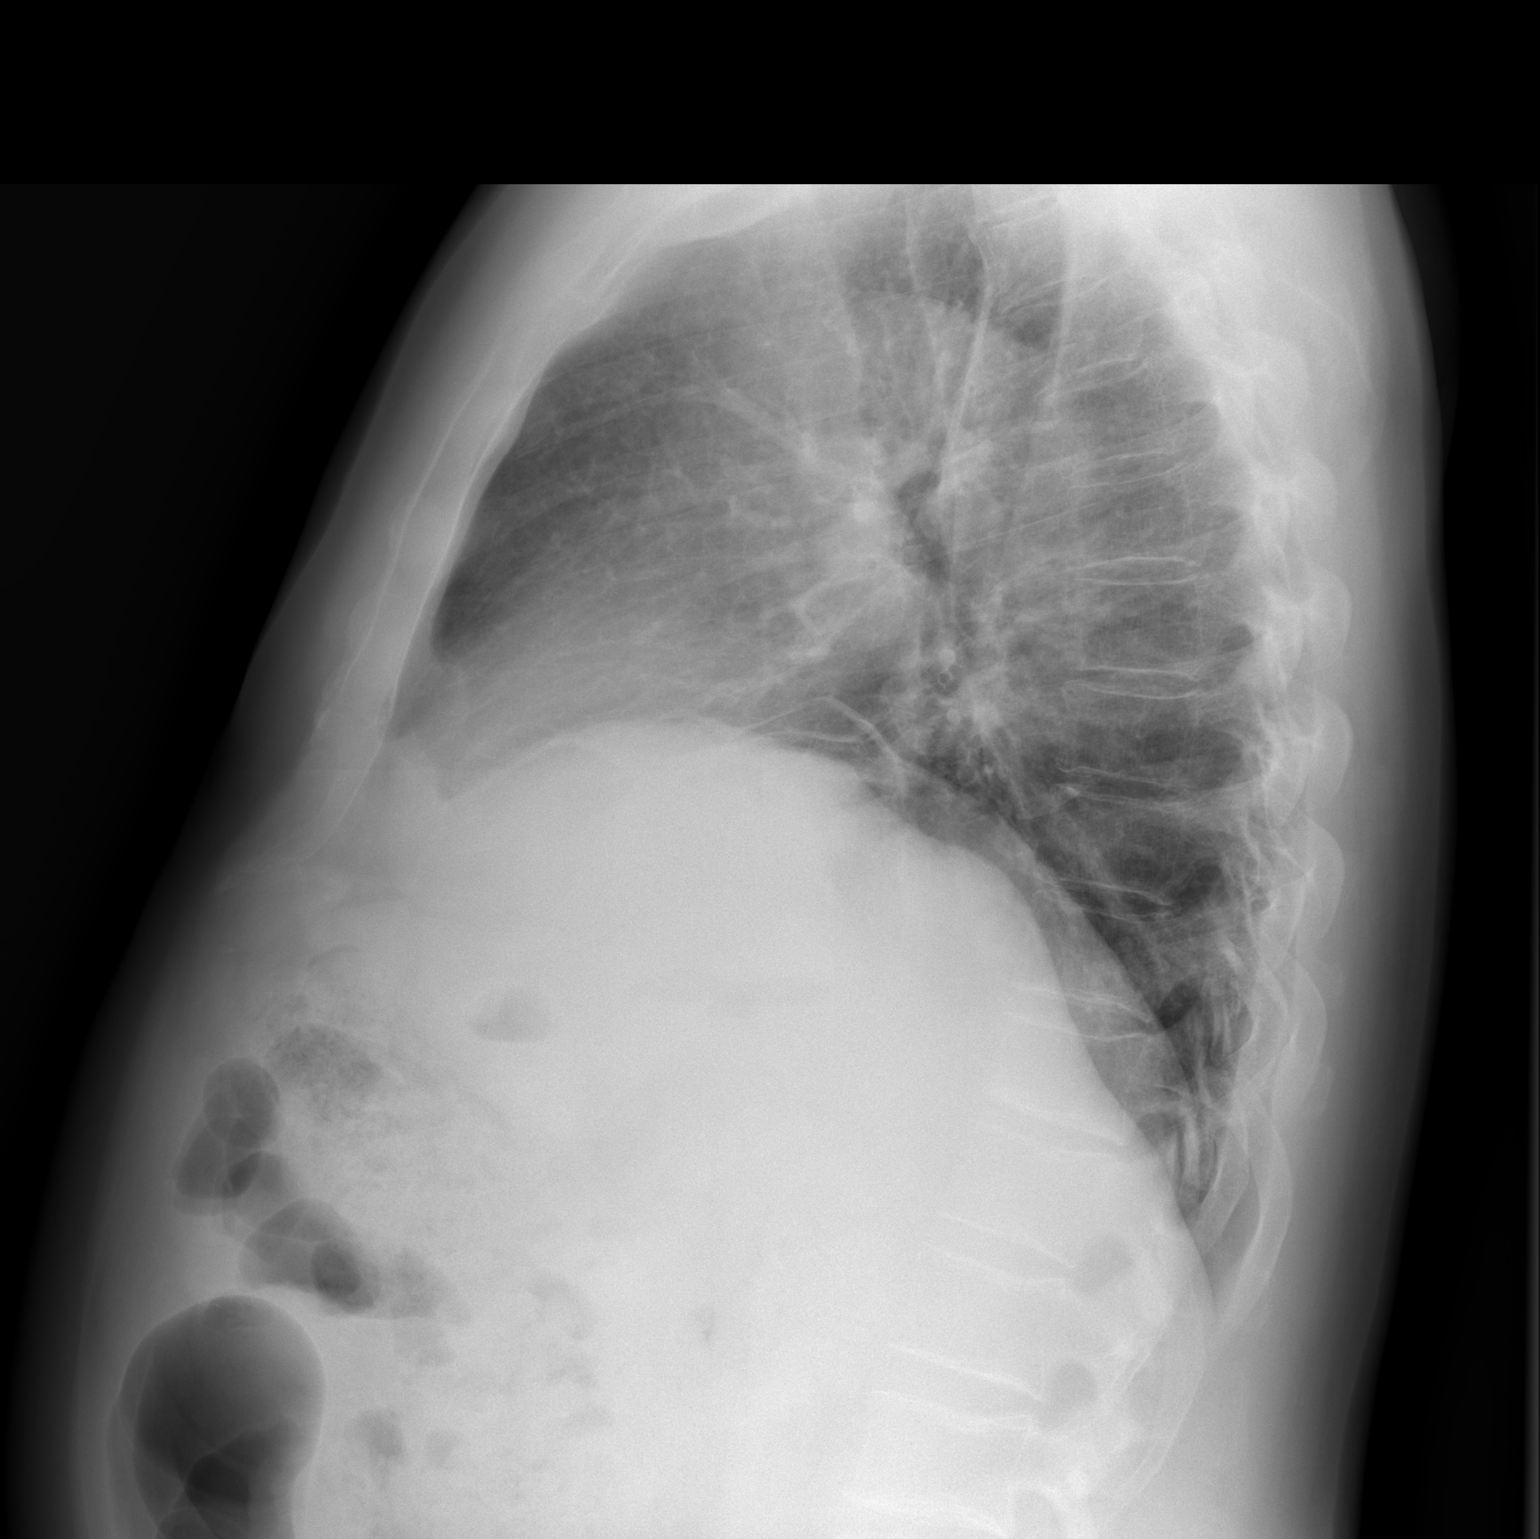

[2 of 2 positions shown; findings below may reference images not displayed]

FINDINGS: Linear atelectasis/ scarring in the lung bases bilaterally. Negative
for heart failure. Negative for pneumonia effusion or mass.
IMPRESSION: No active cardiopulmonary disease.

## 2015-08-02 ENCOUNTER — Other Ambulatory Visit: Payer: Self-pay | Admitting: Internal Medicine

## 2015-08-02 ENCOUNTER — Telehealth: Payer: Self-pay | Admitting: Internal Medicine

## 2015-08-02 NOTE — Telephone Encounter (Signed)
Pt is requesting a refill on his meloxicam (MOBIC) 15 MG tablet. Pt doesn't have an appt until [place] with Dr. Derrel Nip.

## 2015-08-02 NOTE — Telephone Encounter (Signed)
Sent to pharmacy already, prior to message, thanks

## 2015-08-10 IMAGING — US US ABDOMEN COMPLETE
1 series · 14 of 25 positions shown · non-contrast
Comparison: None.

CLINICAL DATA: Elevated liver function studies.

EXAM:
ULTRASOUND ABDOMEN COMPLETE

[Series 1: us abdomen complete · 0.26mm/px · 14 of 43 slices shown]
[im 1/43]
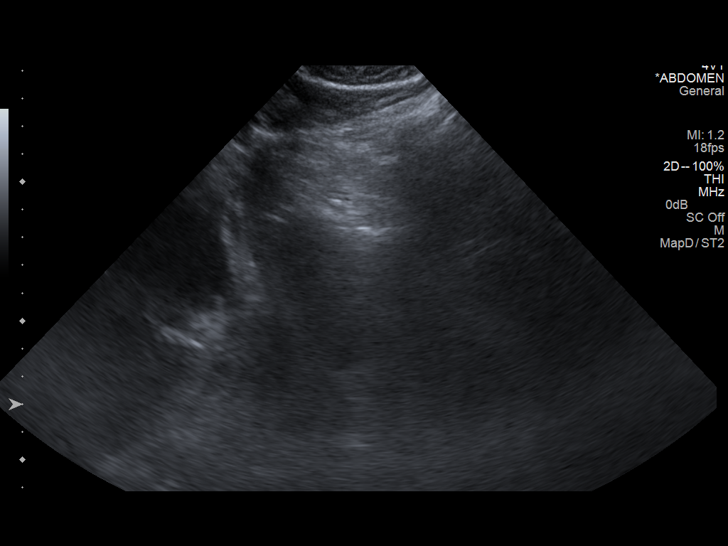
[im 4/43]
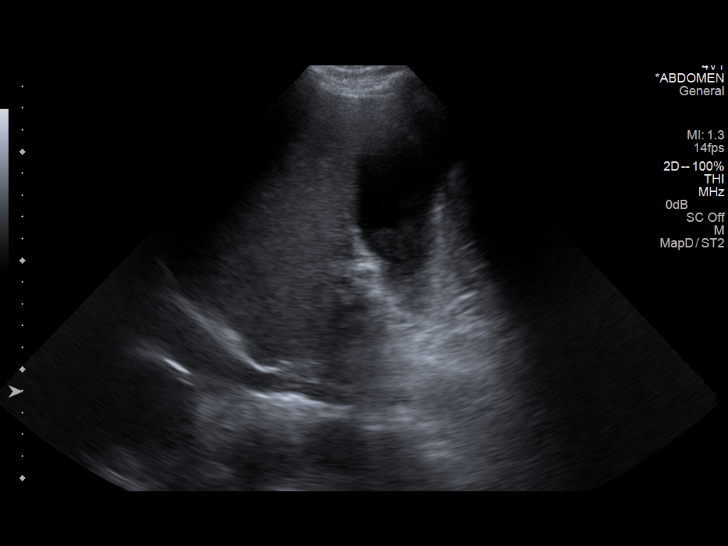
[im 8/43]
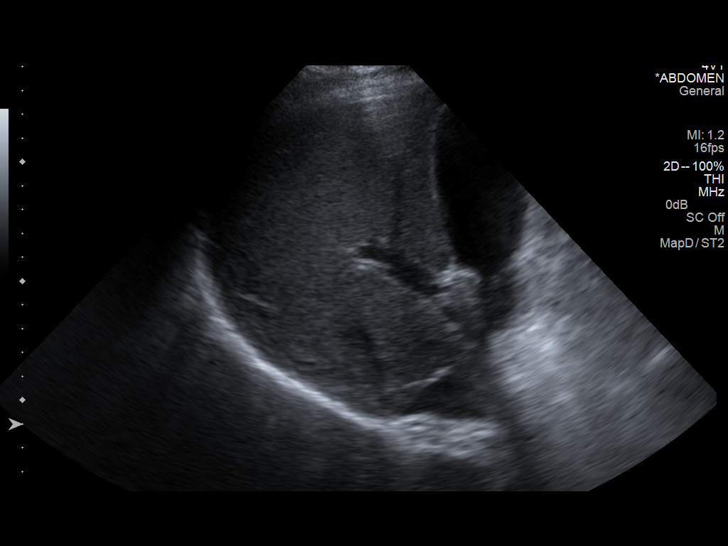
[im 11/43]
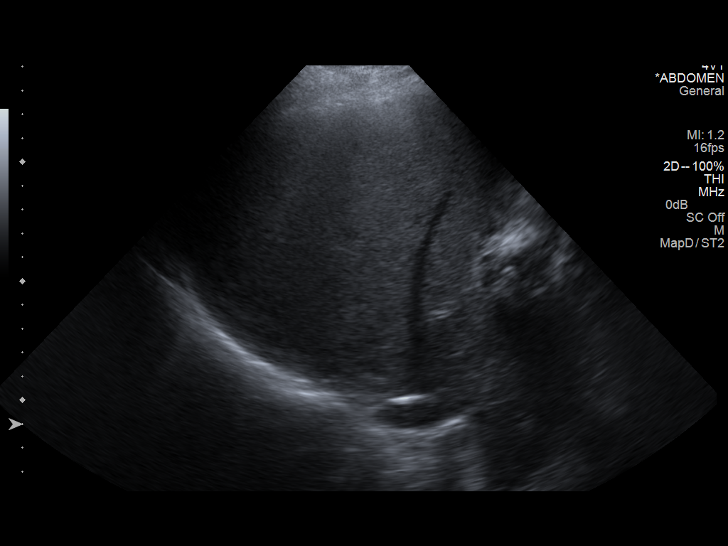
[im 15/43]
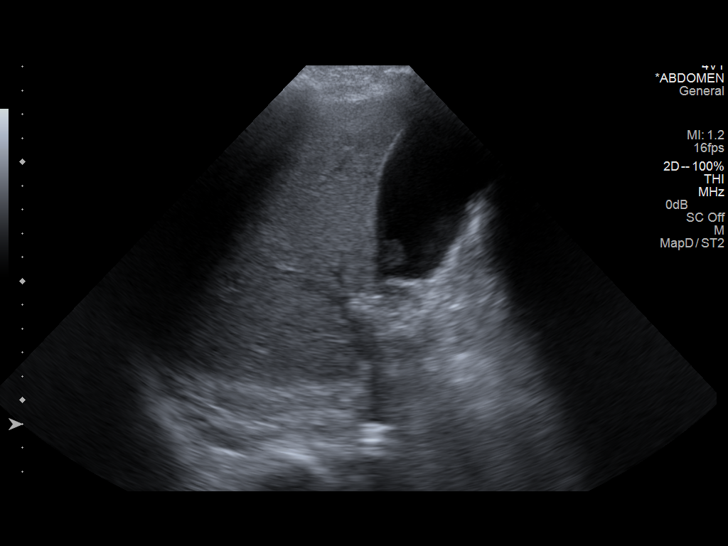
[im 16/43]
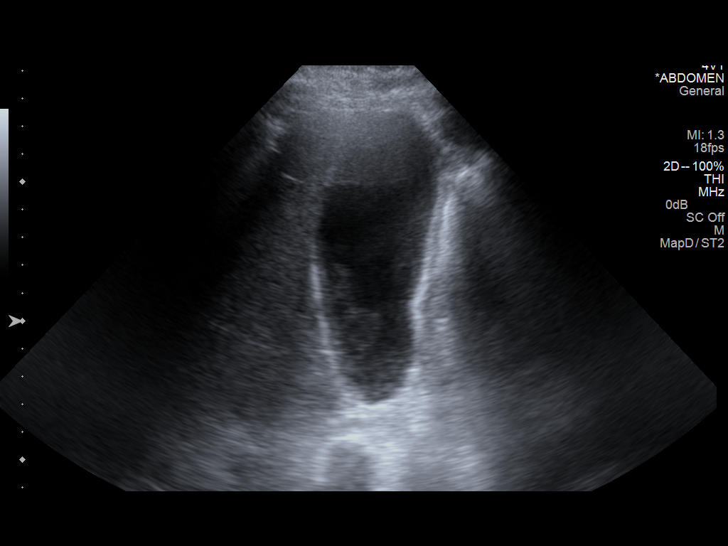
[im 20/43]
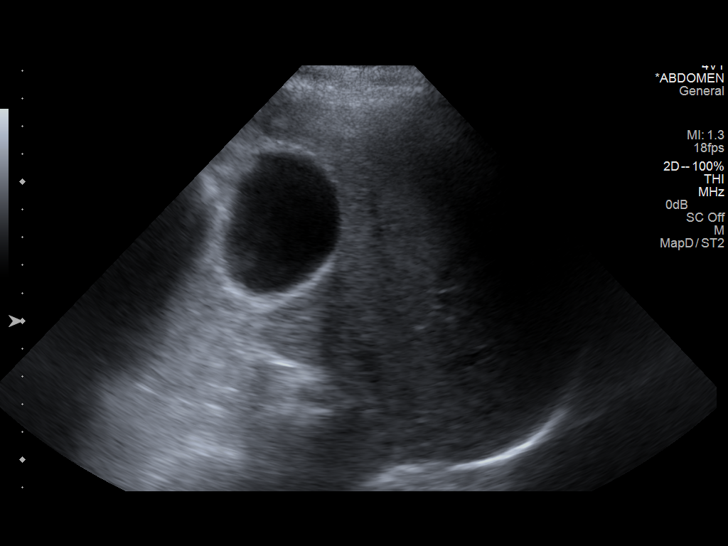
[im 23/43]
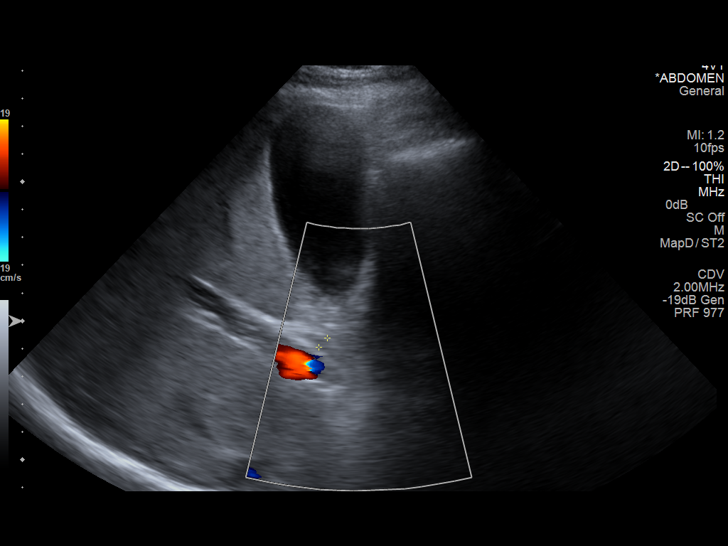
[im 27/43]
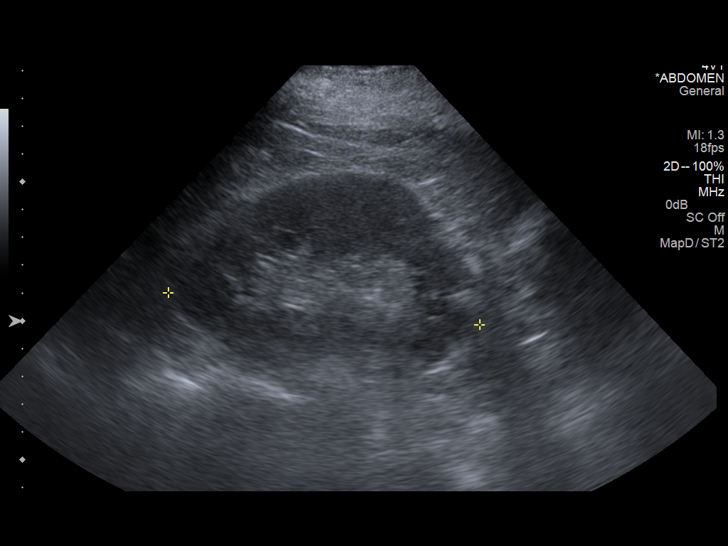
[im 29/43]
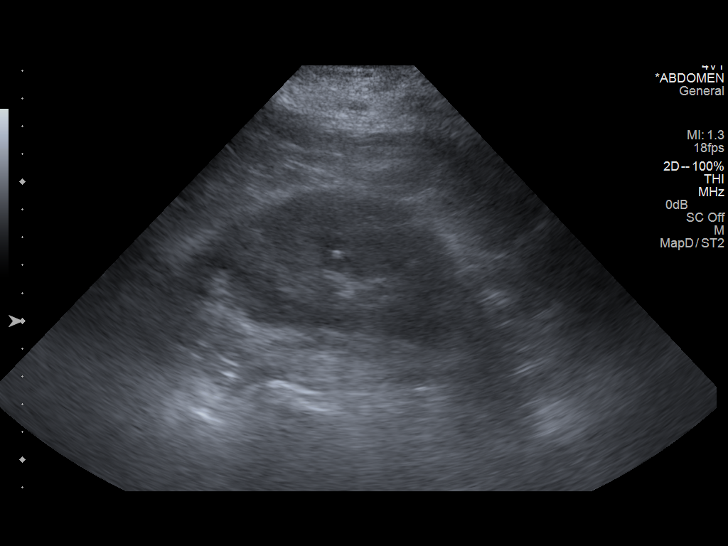
[im 32/43]
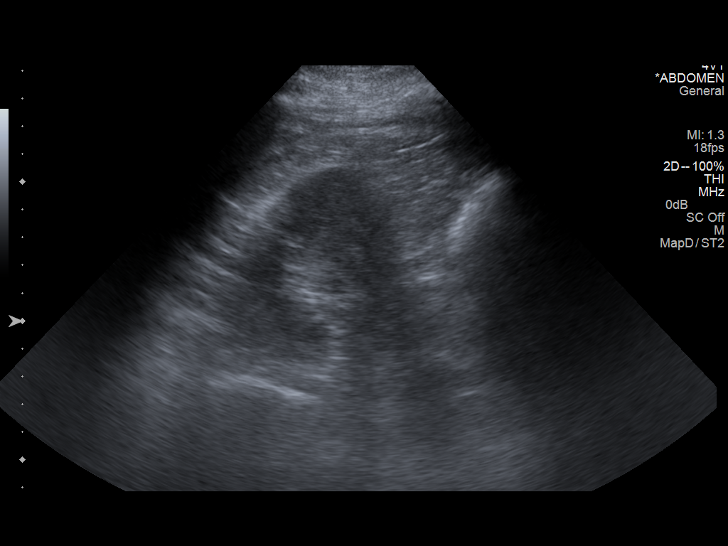
[im 36/43]
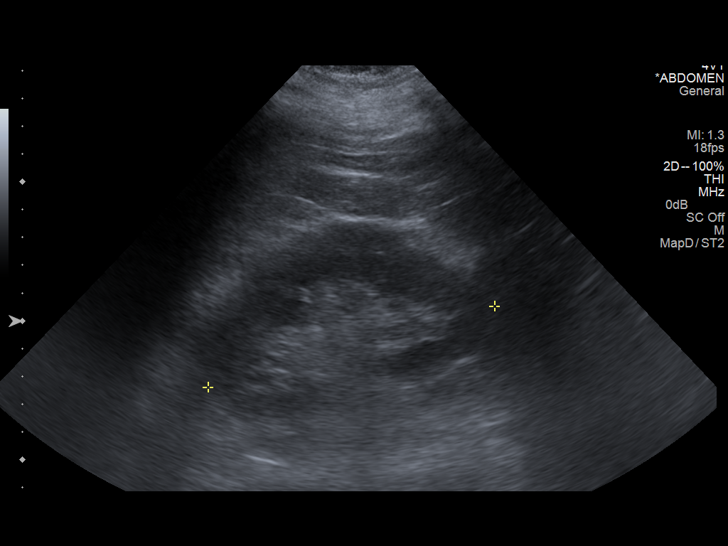
[im 39/43]
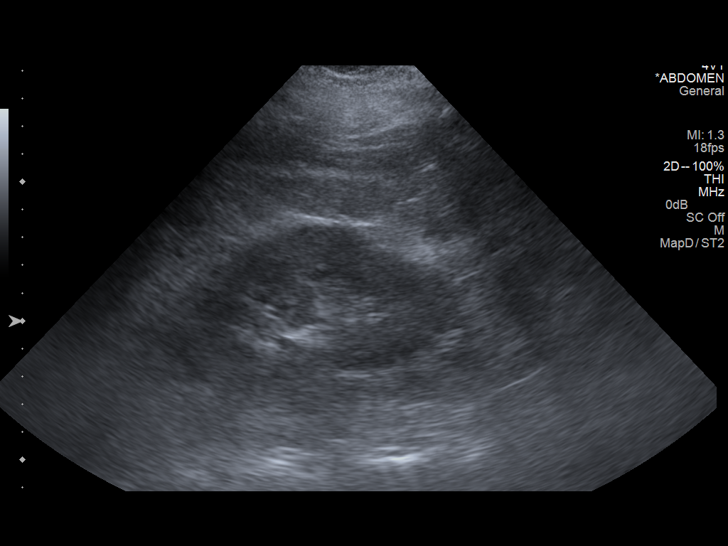
[im 43/43]
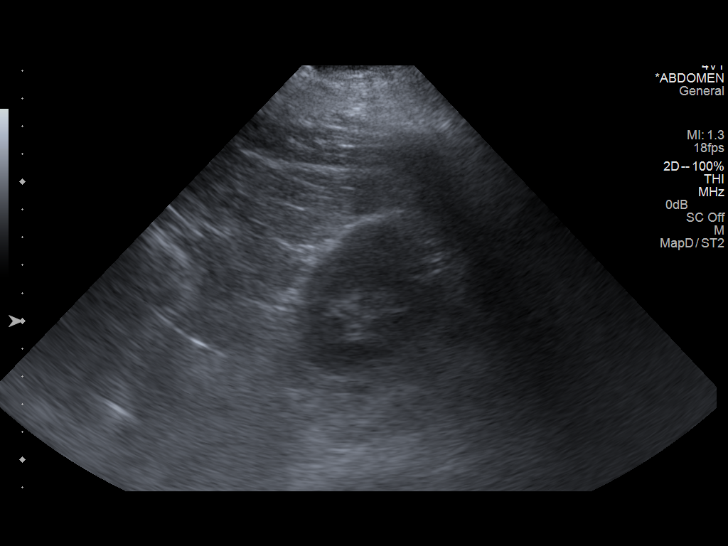

[14 of 25 positions shown; findings below may reference images not displayed]

FINDINGS: Gallbladder:

Tumefactive sludge in the gallbladder. No stones. No wall
thickening. Murphy's sign is negative.

Common bile duct:

Diameter: 4.5 mm, normal.

Liver:

No focal lesion identified. Within normal limits in parenchymal
echogenicity.

IVC:

Limited visualization due to bowel gas.

Pancreas:

Nonvisualization due to bowel gas.

Spleen:

Size and appearance within normal limits.

Right Kidney:

Length: 11.3 cm. Echogenicity within normal limits. No mass or
hydronephrosis visualized.

Left Kidney:

Length: 11.3 cm. Echogenicity within normal limits. No mass or
hydronephrosis visualized.

Abdominal aorta:

No aneurysm visualized.

Other findings:

Technically limited by prominent bowel gas.
IMPRESSION: Sludge in the gallbladder without additional inflammatory findings.
No stones. Liver appears normal. No bile duct dilatation.

## 2015-08-17 DIAGNOSIS — L308 Other specified dermatitis: Secondary | ICD-10-CM | POA: Diagnosis not present

## 2015-09-20 DIAGNOSIS — L308 Other specified dermatitis: Secondary | ICD-10-CM | POA: Diagnosis not present

## 2015-09-21 DIAGNOSIS — Q667 Congenital pes cavus: Secondary | ICD-10-CM | POA: Diagnosis not present

## 2015-09-21 DIAGNOSIS — M79672 Pain in left foot: Secondary | ICD-10-CM | POA: Diagnosis not present

## 2015-09-21 DIAGNOSIS — M25475 Effusion, left foot: Secondary | ICD-10-CM | POA: Diagnosis not present

## 2015-09-21 DIAGNOSIS — M19072 Primary osteoarthritis, left ankle and foot: Secondary | ICD-10-CM | POA: Diagnosis not present

## 2015-10-15 ENCOUNTER — Encounter: Payer: Medicare Other | Admitting: Internal Medicine

## 2015-10-22 ENCOUNTER — Encounter: Payer: Self-pay | Admitting: Internal Medicine

## 2015-10-22 ENCOUNTER — Ambulatory Visit (INDEPENDENT_AMBULATORY_CARE_PROVIDER_SITE_OTHER): Payer: Medicare Other | Admitting: Internal Medicine

## 2015-10-22 VITALS — BP 110/78 | HR 73 | Temp 97.9°F | Resp 10 | Ht 70.0 in | Wt 222.8 lb

## 2015-10-22 DIAGNOSIS — R05 Cough: Secondary | ICD-10-CM | POA: Diagnosis not present

## 2015-10-22 DIAGNOSIS — R059 Cough, unspecified: Secondary | ICD-10-CM

## 2015-10-22 DIAGNOSIS — E785 Hyperlipidemia, unspecified: Secondary | ICD-10-CM

## 2015-10-22 DIAGNOSIS — R7303 Prediabetes: Secondary | ICD-10-CM

## 2015-10-22 DIAGNOSIS — Z23 Encounter for immunization: Secondary | ICD-10-CM

## 2015-10-22 DIAGNOSIS — E7800 Pure hypercholesterolemia, unspecified: Secondary | ICD-10-CM | POA: Diagnosis not present

## 2015-10-22 DIAGNOSIS — J209 Acute bronchitis, unspecified: Secondary | ICD-10-CM

## 2015-10-22 MED ORDER — PREDNISONE 10 MG PO TABS: ORAL_TABLET | ORAL | 0 refills | Status: DC

## 2015-10-22 MED ORDER — AZITHROMYCIN 250 MG PO TABS: ORAL_TABLET | ORAL | 0 refills | Status: DC

## 2015-10-22 NOTE — Progress Notes (Signed)
Subjective:  Patient ID: Nathan Ramirez, male    DOB: 1945-10-02  Age: 70 y.o. MRN: [id number]  CC: The primary encounter diagnosis was Cough. Diagnoses of Pure hypercholesterolemia, Prediabetes, Encounter for immunization, Hyperlipidemia LDL goal <100, and Acute bronchitis, unspecified organism were also pertinent to this visit.  HPI Nathan Ramirez presents for FOLLOW UP ON obesity , hyperlipidemia treated with RYR ,  And obesity   Seeing [facility] for eczema of hands .  Has not been on prednisone for tow week s  Has been having  Productive Cough and feeling chest tightness for  the past 2 weeks , started after visiting [employer],  Public transportation was the shuttle bus from hotel to park.  Did not fly .  The cough is not worse in the supine position no fevers   Taking RYR .Marland Kitchen  Last meal 12:30    Lab Results  Component Value Date   HGBA1C 6.1 10/22/2015     Outpatient Medications Prior to Visit  Medication Sig Dispense Refill  . ADVAIR DISKUS 250-50 MCG/DOSE AEPB Inhale 1 puff into the lungs 2 (two) times daily. 60 each 3  . Ipratropium-Albuterol (COMBIVENT) 20-100 MCG/ACT AERS respimat Inhale 1 puff into the lungs every 6 (six) hours. 4 g 3  . lansoprazole (PREVACID) 15 MG capsule Take 15 mg by mouth daily at 12 noon.    . meloxicam (MOBIC) 15 MG tablet TAKE ONE TABLET BY MOUTH ONCE DAILY 30 tablet 3  . Red Yeast Rice Extract 600 MG CAPS Take 1 capsule by mouth 2 (two) times daily.    . Urea (UTOPIC) 41 % CREA Apply 1 application topically daily.     No facility-administered medications prior to visit.     Review of Systems;  Patient denies headache, fevers, malaise, unintentional weight loss, skin rash, eye pain, sinus congestion and sinus pain, sore throat, dysphagia,  hemoptysis , cough, dyspnea, wheezing, chest pain, palpitations, orthopnea, edema, abdominal pain, nausea, melena, diarrhea, constipation, flank pain, dysuria, hematuria, urinary  Frequency, nocturia,  numbness, tingling, seizures,  Focal weakness, Loss of consciousness,  Tremor, insomnia, depression, anxiety, and suicidal ideation.      Objective:  BP 110/78   Pulse 73   Temp 97.9 F (36.6 C) (Oral)   Resp 10   Ht 5\' 10"  (1.778 m)   Wt 222 lb 12 oz (101 kg)   SpO2 91%   BMI 31.96 kg/m   BP Readings from Last 3 Encounters:  10/22/15 110/78  07/09/15 136/76  04/05/15 118/64    Wt Readings from Last 3 Encounters:  10/22/15 222 lb 12 oz (101 kg)  07/09/15 233 lb 4 oz (105.8 kg)  04/05/15 230 lb (104.3 kg)    General appearance: alert, cooperative and appears stated age Ears: normal TM's and external ear canals both ears Throat: lips, mucosa, and tongue normal; teeth and gums normal Neck: no adenopathy, no carotid bruit, supple, symmetrical, trachea midline and thyroid not enlarged, symmetric, no tenderness/mass/nodules Back: symmetric, no curvature. ROM normal. No CVA tenderness. Lungs: clear to auscultation bilaterally Heart: regular rate and rhythm, S1, S2 normal, no murmur, click, rub or gallop Abdomen: soft, non-tender; bowel sounds normal; no masses,  no organomegaly Pulses: 2+ and symmetric Skin: Skin color, texture, turgor normal. No rashes or lesions Lymph nodes: Cervical, supraclavicular, and axillary nodes normal.  Lab Results  Component Value Date   HGBA1C 6.1 10/22/2015   HGBA1C 5.9 05/18/2014    Lab Results  Component Value Date  CREATININE 1.11 10/22/2015   CREATININE 1.17 07/09/2015   CREATININE 1.02 05/18/2014    Lab Results  Component Value Date   WBC 9.2 10/22/2015   HGB 14.9 10/22/2015   HCT 44.0 10/22/2015   PLT 323.0 10/22/2015   GLUCOSE 106 (H) 10/22/2015   CHOL 187 10/22/2015   TRIG 52.0 10/22/2015   HDL 60.40 10/22/2015   LDLDIRECT 161.8 10/15/2011   LDLCALC 116 (H) 10/22/2015   ALT 33 10/22/2015   AST 27 10/22/2015   NA 140 10/22/2015   K 4.2 10/22/2015   CL 108 10/22/2015   CREATININE 1.11 10/22/2015   BUN 15 10/22/2015     CO2 24 10/22/2015   TSH 1.35 07/09/2015   PSA 0.42 05/18/2014   INR 2.1 (H) 12/27/2012   HGBA1C 6.1 10/22/2015    No results found.  Assessment & Plan:   Problem List Items Addressed This Visit    Hyperlipidemia LDL goal <100    Managed with red yeast rice,  Panel improved.Marland Kitchen  No changes  Lab Results  Component Value Date   CHOL 187 10/22/2015   HDL 60.40 10/22/2015   LDLCALC 116 (H) 10/22/2015   LDLDIRECT 161.8 10/15/2011   TRIG 52.0 10/22/2015   CHOLHDL 3 10/22/2015   Lab Results  Component Value Date   ALT 33 10/22/2015   AST 27 10/22/2015   ALKPHOS 62 10/22/2015   BILITOT 0.6 10/22/2015         Acute bronchitis    .steroids, bronchodilators and abx.  Needs PFTs       Other Visit Diagnoses    Cough    -  Primary   Relevant Orders   Pulmonary Function Test [facility] Only   CBC with Differential/Platelet (Completed)   Pure hypercholesterolemia       Relevant Orders   Lipid panel (Completed)   Prediabetes       Relevant Orders   Comprehensive metabolic panel (Completed)   Hemoglobin A1c (Completed)   Encounter for immunization       Relevant Orders   Flu vaccine HIGH DOSE PF (Completed)      I am having Nathan Ramirez start on azithromycin and predniSONE. I am also having him maintain his lansoprazole, Ipratropium-Albuterol, ADVAIR DISKUS, Urea, Red Yeast Rice Extract, meloxicam, and betamethasone valerate.  Meds ordered this encounter  Medications  . betamethasone valerate (VALISONE) 0.1 % cream    Sig: Apply topically 2 (two) times daily.  Marland Kitchen azithromycin (ZITHROMAX) 250 MG tablet    Sig: 2 tablets one day 1,  One tablet daily until gone    Dispense:  6 tablet    Refill:  0  . predniSONE (DELTASONE) 10 MG tablet    Sig: 6 tablets all at once on Day 1,  Then taper by 1 tablet daily until gone    Dispense:  21 tablet    Refill:  0    There are no discontinued medications.  Follow-up: No Follow-up on file.   Crecencio Mc, MD

## 2015-10-22 NOTE — Patient Instructions (Signed)
I am treating you for a COPD exacerbation  Start the azithromycin today/tomorrow  Do not start the prednisone until Friday   I think it's time to repeat your Pulmonary function tests to rule out COPD

## 2015-10-22 NOTE — Progress Notes (Signed)
Pre-visit discussion using our clinic review tool. No additional management support is needed unless otherwise documented below in the visit note.  

## 2015-10-23 DIAGNOSIS — Z23 Encounter for immunization: Secondary | ICD-10-CM

## 2015-10-23 DIAGNOSIS — J209 Acute bronchitis, unspecified: Secondary | ICD-10-CM | POA: Insufficient documentation

## 2015-10-23 LAB — CBC WITH DIFFERENTIAL/PLATELET
Basophils Absolute: 0.1 10*3/uL (ref 0.0–0.1)
Basophils Relative: 0.8 % (ref 0.0–3.0)
Eosinophils Absolute: 0.3 10*3/uL (ref 0.0–0.7)
Eosinophils Relative: 3.1 % (ref 0.0–5.0)
HCT: 44 % (ref 39.0–52.0)
Hemoglobin: 14.9 g/dL (ref 13.0–17.0)
Lymphocytes Relative: 24.9 % (ref 12.0–46.0)
Lymphs Abs: 2.3 10*3/uL (ref 0.7–4.0)
MCHC: 33.9 g/dL (ref 30.0–36.0)
MCV: 96.9 fl (ref 78.0–100.0)
Monocytes Absolute: 1 10*3/uL (ref 0.1–1.0)
Monocytes Relative: 11.1 % (ref 3.0–12.0)
Neutro Abs: 5.5 10*3/uL (ref 1.4–7.7)
Neutrophils Relative %: 60.1 % (ref 43.0–77.0)
Platelets: 323 10*3/uL (ref 150.0–400.0)
RBC: 4.54 Mil/uL (ref 4.22–5.81)
RDW: 14 % (ref 11.5–15.5)
WBC: 9.2 10*3/uL (ref 4.0–10.5)

## 2015-10-23 LAB — COMPREHENSIVE METABOLIC PANEL
ALT: 33 U/L (ref 0–53)
AST: 27 U/L (ref 0–37)
Albumin: 4.3 g/dL (ref 3.5–5.2)
Alkaline Phosphatase: 62 U/L (ref 39–117)
BUN: 15 mg/dL (ref 6–23)
CO2: 24 mEq/L (ref 19–32)
Calcium: 9.8 mg/dL (ref 8.4–10.5)
Chloride: 108 mEq/L (ref 96–112)
Creatinine, Ser: 1.11 mg/dL (ref 0.40–1.50)
GFR: 69.99 mL/min (ref >60.00)
Glucose, Bld: 106 mg/dL — ABNORMAL HIGH (ref 70–99)
Potassium: 4.2 mEq/L (ref 3.5–5.1)
Sodium: 140 mEq/L (ref 135–145)
Total Bilirubin: 0.6 mg/dL (ref 0.2–1.2)
Total Protein: 7.2 g/dL (ref 6.0–8.3)

## 2015-10-23 LAB — LIPID PANEL
Cholesterol: 187 mg/dL (ref 0–200)
HDL: 60.4 mg/dL (ref >39.00)
LDL Cholesterol: 116 mg/dL — ABNORMAL HIGH (ref 0–99)
NonHDL: 126.8
Total CHOL/HDL Ratio: 3
Triglycerides: 52 mg/dL (ref 0.0–149.0)
VLDL: 10.4 mg/dL (ref 0.0–40.0)

## 2015-10-23 LAB — HEMOGLOBIN A1C: Hgb A1c MFr Bld: 6.1 % (ref 4.6–6.5)

## 2015-10-23 NOTE — Assessment & Plan Note (Addendum)
.  steroids, bronchodilators and abx.  Needs PFTs

## 2015-10-23 NOTE — Assessment & Plan Note (Signed)
Managed with red yeast rice,  Panel improved.Marland Kitchen  No changes  Lab Results  Component Value Date   CHOL 187 10/22/2015   HDL 60.40 10/22/2015   LDLCALC 116 (H) 10/22/2015   LDLDIRECT 161.8 10/15/2011   TRIG 52.0 10/22/2015   CHOLHDL 3 10/22/2015   Lab Results  Component Value Date   ALT 33 10/22/2015   AST 27 10/22/2015   ALKPHOS 62 10/22/2015   BILITOT 0.6 10/22/2015

## 2015-10-24 ENCOUNTER — Encounter: Payer: Self-pay | Admitting: *Deleted

## 2015-10-24 DIAGNOSIS — L308 Other specified dermatitis: Secondary | ICD-10-CM | POA: Diagnosis not present

## 2015-10-25 ENCOUNTER — Telehealth: Payer: Self-pay | Admitting: *Deleted

## 2015-10-25 NOTE — Telephone Encounter (Signed)
[facility] has requested lasted labs to be faxed to their office Fax [phone]

## 2015-10-25 NOTE — Telephone Encounter (Signed)
Labs Faxed.

## 2015-10-26 ENCOUNTER — Telehealth: Payer: Self-pay | Admitting: Internal Medicine

## 2015-10-26 NOTE — Telephone Encounter (Signed)
Spoke to [place]. Labs faxed.

## 2015-10-26 NOTE — Telephone Encounter (Signed)
[id number] option 3 called from [facility] dermatology called wanting to know when pt had his last lipid panel? Please advise? Thank you!

## 2015-11-15 ENCOUNTER — Telehealth: Payer: Self-pay | Admitting: Internal Medicine

## 2015-11-15 NOTE — Telephone Encounter (Signed)
I called and left a vm to call office to sch AWV. Thank you!

## 2015-11-22 ENCOUNTER — Ambulatory Visit (INDEPENDENT_AMBULATORY_CARE_PROVIDER_SITE_OTHER): Payer: Medicare Other | Admitting: Family

## 2015-11-22 ENCOUNTER — Encounter: Payer: Self-pay | Admitting: Family

## 2015-11-22 ENCOUNTER — Telehealth: Payer: Self-pay | Admitting: Internal Medicine

## 2015-11-22 VITALS — BP 110/70 | HR 75 | Temp 98.2°F | Ht 70.0 in | Wt 222.1 lb

## 2015-11-22 DIAGNOSIS — J209 Acute bronchitis, unspecified: Secondary | ICD-10-CM

## 2015-11-22 MED ORDER — ALBUTEROL SULFATE HFA 108 (90 BASE) MCG/ACT IN AERS: 2.0000 | INHALATION_SPRAY | Freq: Four times a day (QID) | RESPIRATORY_TRACT | 1 refills | Status: DC | PRN

## 2015-11-22 MED ORDER — OSELTAMIVIR PHOSPHATE 75 MG PO CAPS: 75.0000 mg | ORAL_CAPSULE | Freq: Two times a day (BID) | ORAL | 0 refills | Status: AC

## 2015-11-22 MED ORDER — HYDROCODONE-HOMATROPINE 5-1.5 MG/5ML PO SYRP: 5.0000 mL | ORAL_SOLUTION | Freq: Every evening | ORAL | 0 refills | Status: DC | PRN

## 2015-11-22 NOTE — Telephone Encounter (Signed)
Pharmacy called and stated that pt's insurance will not cover albuterol (PROVENTIL HFA) 108 (90 Base) MCG/ACT inhaler they will cover [employer] or Ventil. Pt is in the store now. Please advise, thank you!  [place] [address] [place]

## 2015-11-22 NOTE — Progress Notes (Signed)
Subjective:    Patient ID: Nathan Ramirez, male    DOB: [birth year], 70 y.o.   MRN: [id number]  CC: Nathan Ramirez is a 70 y.o. male who presents today for an acute visit.    HPI: Patient here for acute visit with chief complaint of cough, congestion x one day,worsening.  History of asthma. On Combivent and Advair.Endorses wheezing, fever, bodyaches, nasal congestion. No HA, sore throat, SOB. Hasn't tried any medication.     HISTORY:  Past Medical History:  Diagnosis Date  . Allergy   . Arthritis   . Asthma    "allergic"  . Carcinoma, basal cell, skin 2015   thigh  . Chicken pox   . Colon polyp 2011?   pathology unknown  . Elevated blood pressure reading   . GERD (gastroesophageal reflux disease)    never had EGD  . Headache(784.0)    occasional tension  . Hypercholesteremia    borderline  . PONV (postoperative nausea and vomiting)    Past Surgical History:  Procedure Laterality Date  . APPENDECTOMY  1972  . COLONOSCOPY  2011   Dr. Ernst Breach  . JOINT REPLACEMENT Bilateral 2015   knee  . KNEE ARTHROSCOPY  2009& 2010   RIGHT AND LEFT KNEE  . LUMBAR [address] SURGERY  2008  . TOTAL KNEE ARTHROPLASTY Bilateral 12/01/2012   Procedure: TOTAL KNEE BILATERAL;  Surgeon: Gearlean Alf, MD;  Location: [facility];  Service: Orthopedics;  Laterality: Bilateral;   Family History  Problem Relation Age of Onset  . Lung cancer Father   . Prostate cancer Father   . COPD Mother   . Diabetes Sister     Allergies: Patient has no known allergies. Current Outpatient Prescriptions on File Prior to Visit  Medication Sig Dispense Refill  . ADVAIR DISKUS 250-50 MCG/DOSE AEPB Inhale 1 puff into the lungs 2 (two) times daily. 60 each 3  . betamethasone valerate (VALISONE) 0.1 % cream Apply topically 2 (two) times daily.    . lansoprazole (PREVACID) 15 MG capsule Take 15 mg by mouth daily at 12 noon.    . meloxicam (MOBIC) 15 MG tablet TAKE ONE TABLET BY MOUTH ONCE DAILY 30 tablet 3  . Red  Yeast Rice Extract 600 MG CAPS Take 1 capsule by mouth 2 (two) times daily.    . Urea (UTOPIC) 41 % CREA Apply 1 application topically daily.     No current facility-administered medications on file prior to visit.     Social History  Substance Use Topics  . Smoking status: Former Smoker    Years: 40.00    Types: Cigarettes    Quit date: 01/14/1988  . Smokeless tobacco: Never Used  . Alcohol use 0.0 oz/week     Comment: OCCASSIONALLY    Review of Systems  Constitutional: Positive for fever (99.7). Negative for chills.  HENT: Positive for congestion. Negative for sinus pain and sore throat.   Eyes: Negative for visual disturbance.  Respiratory: Positive for cough and wheezing. Negative for shortness of breath.   Cardiovascular: Negative for chest pain and palpitations.  Gastrointestinal: Negative for nausea and vomiting.  Neurological: Negative for headaches.      Objective:    BP 110/70 (BP Location: Left Arm, Patient Position: Sitting, Cuff Size: Large)   Pulse 75   Temp 98.2 F (36.8 C) (Oral)   Ht 5\' 10"  (1.778 m)   Wt 222 lb 1.9 oz (100.8 kg)   SpO2 96%   BMI 31.87 kg/m  Physical Exam  Constitutional: Vital signs are normal. He appears well-developed and well-nourished.  HENT:  Head: Normocephalic and atraumatic.  Right Ear: Hearing, tympanic membrane, external ear and ear canal normal. No drainage, swelling or tenderness. Tympanic membrane is not injected, not erythematous and not bulging. No middle ear effusion. No decreased hearing is noted.  Left Ear: Hearing, tympanic membrane, external ear and ear canal normal. No drainage, swelling or tenderness. Tympanic membrane is not injected, not erythematous and not bulging.  No middle ear effusion. No decreased hearing is noted.  Nose: Rhinorrhea present. Right sinus exhibits no maxillary sinus tenderness and no frontal sinus tenderness. Left sinus exhibits no maxillary sinus tenderness and no frontal sinus tenderness.    Mouth/Throat: Uvula is midline, oropharynx is clear and moist and mucous membranes are normal. No oropharyngeal exudate, posterior oropharyngeal edema, posterior oropharyngeal erythema or tonsillar abscesses.  Eyes: Conjunctivae are normal.  Cardiovascular: Regular rhythm and normal heart sounds.   Pulmonary/Chest: Effort normal and breath sounds normal. No respiratory distress. He has no wheezes. He has no rhonchi. He has no rales.  Lymphadenopathy:       Head (right side): No submental, no submandibular, no tonsillar, no preauricular, no posterior auricular and no occipital adenopathy present.       Head (left side): No submental, no submandibular, no tonsillar, no preauricular, no posterior auricular and no occipital adenopathy present.    He has no cervical adenopathy.  Neurological: He is alert.  Skin: Skin is warm and dry.  Psychiatric: He has a normal mood and affect. His speech is normal and behavior is normal.  Vitals reviewed.      Assessment & Plan:  1. Acute bronchitis, unspecified organism  Rapid test flu negative. However concern based on rapid onset of symptoms, worsening, fever and body aches, patient, wife, and I jointly agreed to start Tamiflu.. Symptoms c/w influenza. Gave education regarding rescue inhaler, albuterol, for asthma. Advised the patient to only use cough syrup very sparingly due to his history of asthma.  - oseltamivir (TAMIFLU) 75 MG capsule; Take 1 capsule (75 mg total) by mouth 2 (two) times daily.  Dispense: 10 capsule; Refill: 0 - HYDROcodone-homatropine (HYCODAN) 5-1.5 MG/5ML syrup; Take 5 mLs by mouth at bedtime as needed for cough.  Dispense: 30 mL; Refill: 0 - albuterol (PROVENTIL HFA) 108 (90 Base) MCG/ACT inhaler; Inhale 2 puffs into the lungs every 6 (six) hours as needed for wheezing or shortness of breath.  Dispense: 1 Inhaler; Refill: 1     I have discontinued Mr. Gailes Ipratropium-Albuterol, azithromycin, and predniSONE. I am also  having him maintain his lansoprazole, ADVAIR DISKUS, Urea, Red Yeast Rice Extract, meloxicam, betamethasone valerate, acitretin, augmented betamethasone dipropionate, and MULTI FOR HIM 50+.   Meds ordered this encounter  Medications  . acitretin (SORIATANE) 25 MG capsule    Sig: Take 1 capsule by mouth daily.  Marland Kitchen augmented betamethasone dipropionate (DIPROLENE-AF) 0.05 % ointment    Sig: Apply to affected area as needed.  . Multiple Vitamins-Minerals (MULTI FOR HIM 50+) TABS    Sig: Take 1 tablet by mouth daily.    Return precautions given.   Risks, benefits, and alternatives of the medications and treatment plan prescribed today were discussed, and patient expressed understanding.   Education regarding symptom management and diagnosis given to patient on AVS.  Continue to follow with TULLO, Aris Everts, MD for routine health maintenance.   Nathan Ramirez and I agreed with plan.   Mable Paris, FNP

## 2015-11-22 NOTE — Patient Instructions (Signed)

## 2015-11-22 NOTE — Progress Notes (Signed)
Pre visit review using our clinic review tool, if applicable. No additional management support is needed unless otherwise documented below in the visit note. 

## 2015-11-22 NOTE — Telephone Encounter (Signed)
Spoke with [facility] and informed them per Dr. Nicki Reaper that it's okay to change it to which ever one the insurance will approve.//AB/CMA

## 2015-12-12 ENCOUNTER — Telehealth: Payer: Self-pay | Admitting: Internal Medicine

## 2015-12-12 NOTE — Telephone Encounter (Signed)
Pt has declined to get AWV. Thank you!

## 2015-12-24 ENCOUNTER — Telehealth: Payer: Self-pay | Admitting: Internal Medicine

## 2015-12-24 ENCOUNTER — Encounter: Payer: Self-pay | Admitting: Emergency Medicine

## 2015-12-24 ENCOUNTER — Emergency Department
Admission: EM | Admit: 2015-12-24 | Discharge: 2015-12-24 | Disposition: A | Discharge status: Home / Self Care | Payer: Medicare Other | Attending: Emergency Medicine | Admitting: Emergency Medicine

## 2015-12-24 ENCOUNTER — Emergency Department: Payer: Medicare Other

## 2015-12-24 ENCOUNTER — Telehealth: Payer: Self-pay | Admitting: *Deleted

## 2015-12-24 DIAGNOSIS — J4520 Mild intermittent asthma, uncomplicated: Secondary | ICD-10-CM | POA: Insufficient documentation

## 2015-12-24 DIAGNOSIS — R0602 Shortness of breath: Secondary | ICD-10-CM | POA: Diagnosis not present

## 2015-12-24 DIAGNOSIS — Z87891 Personal history of nicotine dependence: Secondary | ICD-10-CM | POA: Insufficient documentation

## 2015-12-24 DIAGNOSIS — L509 Urticaria, unspecified: Secondary | ICD-10-CM | POA: Insufficient documentation

## 2015-12-24 DIAGNOSIS — R0600 Dyspnea, unspecified: Secondary | ICD-10-CM

## 2015-12-24 DIAGNOSIS — J189 Pneumonia, unspecified organism: Secondary | ICD-10-CM

## 2015-12-24 DIAGNOSIS — J181 Lobar pneumonia, unspecified organism: Secondary | ICD-10-CM | POA: Insufficient documentation

## 2015-12-24 DIAGNOSIS — R0609 Other forms of dyspnea: Secondary | ICD-10-CM

## 2015-12-24 DIAGNOSIS — R05 Cough: Secondary | ICD-10-CM | POA: Diagnosis not present

## 2015-12-24 MED ORDER — LIDOCAINE HCL (PF) 1 % IJ SOLN
INTRAMUSCULAR | Status: AC
  Filled 2015-12-24: qty 5

## 2015-12-24 MED ORDER — CEFTRIAXONE SODIUM 1 G IJ SOLR
1.0000 g | Freq: Once | INTRAMUSCULAR | Status: AC
  Administered 2015-12-24: 1 g via INTRAMUSCULAR
  Filled 2015-12-24: qty 10

## 2015-12-24 MED ORDER — PREDNISONE 10 MG PO TABS: 10.0000 mg | ORAL_TABLET | Freq: Every day | ORAL | 0 refills | Status: DC

## 2015-12-24 MED ORDER — AZITHROMYCIN 250 MG PO TABS: ORAL_TABLET | ORAL | 0 refills | Status: DC

## 2015-12-24 NOTE — Telephone Encounter (Signed)
Patient is having panting type breathing, Patient becomes out of breath was treated with tamiflu and had allergic reaction by NP X $ weeks ago . Patient cannot speak in full sentence with out stopping becomes short breath walking across room advised patient wife with no appointments available to day and patient that sob HE NEEDS TO GO TO er NOW TO BE EVALUATED. Patient agreed along with patient wife and are going to ER to be evaluated today,

## 2015-12-24 NOTE — Telephone Encounter (Signed)
Please call and follow up with patient, thanks

## 2015-12-24 NOTE — ED Triage Notes (Signed)
Was dx with URI a couple weeks ago per wife.  He was given tamiflu because thought it was the flu; had an allergic reaction to tamiflu so stopped it.  Pt was taking Acitretin and reports rash started from this.  Doctor sent pt for steroid dose pack and chest xray for URI.  Pt has had DOE per wife. No distress in triage. Hx asthma. Has cough in AM per wife. No wheezing in triage on auscultation. here for URI and rash. Wife reports he just needs zpack and steroids.

## 2015-12-24 NOTE — Telephone Encounter (Signed)
Patient Name: Nathan Ramirez  DOB: [birth year]    Initial Comment Caller states that her husband has an inhaler and she has been having some panting. He has has asthma. He is brathing fast ans wheezing.    Nurse Assessment  Nurse: Leilani Merl, RN, Heather Date/Time (Eastern Time): 12/24/2015 12:51:42 PM  Confirm and document reason for call. If symptomatic, describe symptoms. ---Caller states that her husband has an inhaler and she has been having some panting. He was seen last week and given tamiflu, he only took 3 days of it b/c it made him sick. He has has asthma. He is breathing fast and wheezing.  Does the patient have any new or worsening symptoms? ---Yes  Will a triage be completed? ---Yes  Related visit to physician within the last 2 weeks? ---Yes  Does the PT have any chronic conditions? (i.e. diabetes, asthma, etc.) ---Yes  List chronic conditions. ---asthma  Is this a behavioral health or substance abuse call? ---No     Guidelines    Guideline Title Affirmed Question Affirmed Notes  Asthma Attack Patient sounds very sick or weak to the triager    Final Disposition User   Go to ED Now (or PCP triage) Leilani Merl, RN, Nira Conn    Comments  Called the office at [phone] and gave info on patient refusal to go to the ED.   Referrals  GO TO FACILITY REFUSED   Disagree/Comply: Disagree  Disagree/Comply Reason: Disagree with instructions

## 2015-12-24 NOTE — Telephone Encounter (Signed)
FYI

## 2015-12-24 NOTE — Telephone Encounter (Signed)
Heather from [facility] called and stated that pt is refusing to go to Ed, they want to be seen in the office today.

## 2015-12-24 NOTE — Telephone Encounter (Signed)
Patient in ER will follow

## 2015-12-24 NOTE — Telephone Encounter (Signed)
Patient was transferred to the nurse line, pt's wife stated that pt has terrible asthma , and has been constantly using his inhaler. Wife reports that pt has been panting, while breathing. She would like to have him scheduled in the office today.  Wife contact [phone]

## 2015-12-24 NOTE — ED Provider Notes (Signed)
[facility] Emergency Department Provider Note  ____________________________________________  Time seen: Approximately 4:06 PM  I have reviewed the triage vital signs and the nursing notes.   HISTORY  Chief Complaint Shortness of Breath    HPI Nathan Ramirez is a 70 y.o. male who presents emergency department complaining of cough, wheezing, dyspnea on exertion 5 weeks. Patient was originally seen by primary care and was diagnosed with URI with flulike symptoms. Patient was started on Tamiflu at that time had a strong allergic reaction to same. Patient reports that symptoms somewhat improved but have persisted over the intervening course. Patient reports that the coughing and mild shortness of breath have continuing increased as well. Patient does have an underlying history of asthma and has been using his inhalers more than normal. Patient denies any difficulty breathing. He denies any fevers or chills, headache, visual changes, neck pain, chest pain, abdominal pain, nausea or vomiting.  Patient is also reporting allergic reaction to medication prescribed by dermatology. Patient has residual rash to bilateral palms of hands. Patient has been seen by dermatology twice and placed on different medications. Patient was placed on acitretin and has had diffuse rash form after starting this medication. Patient stopped this medication and symptoms have improved mildly. She denies any other symptoms associated with this time.   Past Medical History:  Diagnosis Date  . Allergy   . Arthritis   . Asthma    "allergic"  . Carcinoma, basal cell, skin 2015   thigh  . Chicken pox   . Colon polyp 2011?   pathology unknown  . Elevated blood pressure reading   . GERD (gastroesophageal reflux disease)    never had EGD  . Headache(784.0)    occasional tension  . Hypercholesteremia    borderline  . PONV (postoperative nausea and vomiting)     Patient Active Problem List    Diagnosis Date Noted  . Acute bronchitis 10/23/2015  . Polyarthritis of multiple sites ([place]) 07/10/2015  . Conjunctivitis 04/05/2015  . Plaque psoriasis 02/16/2015  . Special screening for malignant neoplasms, colon 06/03/2014  . History of colonic polyps 06/03/2014  . Status post total bilateral knee replacement 12/06/2012  . OA (osteoarthritis) of knee 12/01/2012  . Medicare welcome visit 11/19/2012  . Other and unspecified hyperlipidemia 11/17/2012  . Irritable bowel syndrome 10/01/2012  . GERD (gastroesophageal reflux disease) 09/19/2012  . Medicare annual wellness visit, subsequent 06/15/2012  . Hyperlipidemia LDL goal <100 11/17/2011  . Obesity (BMI 30.0-34.9) 11/17/2011  . ALLERGIC RHINITIS CAUSE UNSPECIFIED 02/20/2010  . Asthma 02/20/2010    Past Surgical History:  Procedure Laterality Date  . APPENDECTOMY  1972  . COLONOSCOPY  2011   Dr. Ernst Breach  . JOINT REPLACEMENT Bilateral 2015   knee  . KNEE ARTHROSCOPY  2009& 2010   RIGHT AND LEFT KNEE  . LUMBAR [place] SURGERY  2008  . TOTAL KNEE ARTHROPLASTY Bilateral 12/01/2012   Procedure: TOTAL KNEE BILATERAL;  Surgeon: Gearlean Alf, MD;  Location: [facility];  Service: Orthopedics;  Laterality: Bilateral;    Prior to Admission medications   Medication Sig Start Date End Date Taking? Authorizing Provider  acitretin (SORIATANE) 25 MG capsule Take 1 capsule by mouth daily. 11/20/15   Historical Provider, MD  ADVAIR DISKUS 250-50 MCG/DOSE AEPB Inhale 1 puff into the lungs 2 (two) times daily. 02/20/15   Crecencio Mc, MD  albuterol (PROVENTIL HFA) 108 (90 Base) MCG/ACT inhaler Inhale 2 puffs into the lungs every 6 (six) hours  as needed for wheezing or shortness of breath. 11/22/15   Burnard Hawthorne, FNP  augmented betamethasone dipropionate (DIPROLENE-AF) 0.05 % ointment Apply to affected area as needed. 11/02/15   Historical Provider, MD  azithromycin (ZITHROMAX Z-PAK) 250 MG tablet Take 2 tablets (500 mg) on  Day 1,  followed by  1 tablet (250 mg) once daily on Days 2 through 5. 12/24/15   Roderic Palau D Sarim Rothman, PA-C  betamethasone valerate (VALISONE) 0.1 % cream Apply topically 2 (two) times daily.    Historical Provider, MD  HYDROcodone-homatropine (HYCODAN) 5-1.5 MG/5ML syrup Take 5 mLs by mouth at bedtime as needed for cough. 11/22/15   Burnard Hawthorne, FNP  lansoprazole (PREVACID) 15 MG capsule Take 15 mg by mouth daily at 12 noon.    Historical Provider, MD  meloxicam (MOBIC) 15 MG tablet TAKE ONE TABLET BY MOUTH ONCE DAILY 08/02/15   Crecencio Mc, MD  Multiple Vitamins-Minerals (MULTI FOR HIM 50+) TABS Take 1 tablet by mouth daily.    Historical Provider, MD  predniSONE (DELTASONE) 10 MG tablet Take 1 tablet (10 mg total) by mouth daily. 12/24/15   Charline Bills Anaiyah Anglemyer, PA-C  Red Yeast Rice Extract 600 MG CAPS Take 1 capsule by mouth 2 (two) times daily.    Historical Provider, MD  Urea (UTOPIC) 41 % CREA Apply 1 application topically daily.    Historical Provider, MD    Allergies Acitretin  Family History  Problem Relation Age of Onset  . COPD Mother   . Lung cancer Father   . Prostate cancer Father   . Diabetes Sister     Social History Social History  Substance Use Topics  . Smoking status: Former Smoker    Years: 40.00    Types: Cigarettes    Quit date: 01/14/1988  . Smokeless tobacco: Never Used  . Alcohol use 0.0 oz/week     Comment: OCCASSIONALLY     Review of Systems  Constitutional: No fever/chills Eyes: No visual changes. No discharge ENT: No upper respiratory complaints. Cardiovascular: no chest pain. Respiratory: Positive for cough. Positive for dyspnea on exertion. Gastrointestinal: No abdominal pain.  No nausea, no vomiting.  Musculoskeletal: Negative for musculoskeletal pain. Skin: Positive for diffuse papular and wheal like rash. Neurological: Negative for headaches, focal weakness or numbness. 10-point ROS otherwise  negative.  ____________________________________________   PHYSICAL EXAM:  VITAL SIGNS: ED Triage Vitals  Enc Vitals Group     BP 12/24/15 1533 120/82     Pulse Rate 12/24/15 1531 71     Resp 12/24/15 1531 18     Temp 12/24/15 1531 98 F (36.7 C)     Temp Source 12/24/15 1531 Oral     SpO2 12/24/15 1531 95 %     Weight 12/24/15 1532 215 lb (97.5 kg)     Height 12/24/15 1532 5\' 10"  (1.778 m)     Head Circumference --      Peak Flow --      Pain Score 12/24/15 1551 5     Pain Loc --      Pain Edu? --      Excl. in [place]? --      Constitutional: Alert and oriented. Well appearing and in no acute distress. Eyes: Conjunctivae are normal. PERRL. EOMI. Head: Atraumatic. ENT:      Ears:       Nose: No congestion/rhinnorhea.      Mouth/Throat: Mucous membranes are moist.  Neck: No stridor.    Cardiovascular: Normal rate, regular  rhythm. Normal S1 and S2.  Good peripheral circulation. Respiratory: Normal respiratory effort without tachypnea or retractions. Lungs With mild respiratory wheezing and left lower quadrant. No rales or rhonchi.Kermit Balo air entry to the bases with no decreased or absent breath sounds. Musculoskeletal: Full range of motion to all extremities. No gross deformities appreciated. Neurologic:  Normal speech and language. No gross focal neurologic deficits are appreciated.  Skin:  Skin is warm, dry and intact. Diffuse papular like rash over significant portions of body. Psychiatric: Mood and affect are normal. Speech and behavior are normal. Patient exhibits appropriate insight and judgement.   ____________________________________________   LABS (all labs ordered are listed, but only abnormal results are displayed)  Labs Reviewed - No data to display ____________________________________________  EKG   ____________________________________________  RADIOLOGY Diamantina Providence Divonte Senger, personally viewed and evaluated these images (plain radiographs) as part of  my medical decision making, as well as reviewing the written report by the radiologist.  Dg Chest 2 View  Result Date: 12/24/2015 CLINICAL DATA:  Cough and shortness of breath EXAM: CHEST  2 VIEW COMPARISON:  November 24, 2012 FINDINGS: There is scarring bases. There is increased opacity in the left base compared to most recent study, concerning for focal pneumonia superimposed on scarring. Lungs elsewhere are clear. Heart size and pulmonary vascularity are normal. No adenopathy. No bone lesions. IMPRESSION: Bibasilar scarring. Suspect pneumonia superimposed on scarring in the left base. Lungs elsewhere clear. Stable cardiac silhouette. Followup PA and lateral chest radiographs recommended in 3-4 weeks following trial of antibiotic therapy to ensure resolution and exclude underlying malignancy. Electronically Signed   By: Lowella Grip III M.D.   On: 12/24/2015 16:32    ____________________________________________    PROCEDURES  Procedure(s) performed:    Procedures    Medications  cefTRIAXone (ROCEPHIN) injection 1 g (not administered)  lidocaine (PF) (XYLOCAINE) 1 % injection (not administered)     ____________________________________________   INITIAL IMPRESSION / ASSESSMENT AND PLAN / ED COURSE  Pertinent labs & imaging results that were available during my care of the patient were reviewed by me and considered in my medical decision making (see chart for details).  Review of the [place] CSRS was performed in accordance of the [place] prior to dispensing any controlled drugs.  Clinical Course     Patient's diagnosis is consistent with Community-acquired pneumonia, dyspnea on exertion, mild intermittent asthma, urticaria. Patient presented with 5 week history of continued cough and mild dyspnea on exertion. X-ray reveals left lower lobe community-acquired pneumonia, consistent with Mycoplasma pneumonia.. Due to patient's prolonged history of same and underlying asthma, he'll be  treated with shot of Rocephin in the emergency Department and Zithromax at home. Patient is given steroids for both respiratory as well as urticaria symptoms..Patient is to follow up with primary care as needed or otherwise directed. Patient is given ED precautions to return to the ED for any worsening or new symptoms.     ____________________________________________  FINAL CLINICAL IMPRESSION(S) / ED DIAGNOSES  Final diagnoses:  Community acquired pneumonia of left lower lobe of lung ([place])  DOE (dyspnea on exertion)  Mild intermittent asthma without complication  Urticaria      NEW MEDICATIONS STARTED DURING THIS VISIT:  New Prescriptions   AZITHROMYCIN (ZITHROMAX Z-PAK) 250 MG TABLET    Take 2 tablets (500 mg) on  Day 1,  followed by 1 tablet (250 mg) once daily on Days 2 through 5.   PREDNISONE (DELTASONE) 10 MG TABLET    Take  1 tablet (10 mg total) by mouth daily.        This chart was dictated using voice recognition software/Dragon. Despite best efforts to proofread, errors can occur which can change the meaning. Any change was purely unintentional.    Darletta Moll, PA-C 12/24/15 1703    Eula Listen, MD 12/24/15 2206

## 2015-12-24 NOTE — ED Notes (Addendum)
1.Pt reports that he had URI 5 weeks ago - negative flu test but was given tamiflu (had allergic reaction with vomiting and severe headache) - has a hx of asthma and since URI has began to have shortness of breath with any exertion - he is using advair and albuterol BID but it is not relieving the wheezing - pt reports cough in the am (dry and nonproductive but coughs forceful enough to make him vomit) - afebrile - c/o tightness in chest 2.Pt has rash all over entire body - wife ?'s if allergic reaction to medication he is taknig for rash on palms of hands - denies itching

## 2015-12-25 ENCOUNTER — Other Ambulatory Visit: Payer: Self-pay | Admitting: Internal Medicine

## 2015-12-25 MED ORDER — ALBUTEROL SULFATE (2.5 MG/3ML) 0.083% IN NEBU: 2.5000 mg | INHALATION_SOLUTION | Freq: Four times a day (QID) | RESPIRATORY_TRACT | 1 refills | Status: DC | PRN

## 2015-12-31 ENCOUNTER — Telehealth: Payer: Self-pay | Admitting: Internal Medicine

## 2015-12-31 DIAGNOSIS — J189 Pneumonia, unspecified organism: Secondary | ICD-10-CM

## 2015-12-31 DIAGNOSIS — J4521 Mild intermittent asthma with (acute) exacerbation: Secondary | ICD-10-CM

## 2015-12-31 NOTE — Telephone Encounter (Signed)
Patient Nathan Ramirez ( wife) called and requesting a referral to pulmonology for patient at the [facility] office does not have a preference to doctor. Stated patient is better from his pneumonia and improving with nebulizer.

## 2015-12-31 NOTE — Telephone Encounter (Signed)
Referral in process

## 2016-01-01 ENCOUNTER — Ambulatory Visit (INDEPENDENT_AMBULATORY_CARE_PROVIDER_SITE_OTHER): Payer: Medicare Other | Admitting: Allergy and Immunology

## 2016-01-01 ENCOUNTER — Encounter: Payer: Self-pay | Admitting: Allergy and Immunology

## 2016-01-01 VITALS — BP 110/64 | HR 72 | Temp 97.7°F | Resp 16 | Ht 69.0 in | Wt 236.2 lb

## 2016-01-01 DIAGNOSIS — J4540 Moderate persistent asthma, uncomplicated: Secondary | ICD-10-CM

## 2016-01-01 DIAGNOSIS — J3089 Other allergic rhinitis: Secondary | ICD-10-CM | POA: Diagnosis not present

## 2016-01-01 DIAGNOSIS — L308 Other specified dermatitis: Secondary | ICD-10-CM

## 2016-01-01 DIAGNOSIS — L989 Disorder of the skin and subcutaneous tissue, unspecified: Secondary | ICD-10-CM

## 2016-01-01 MED ORDER — TACROLIMUS 0.1 % EX OINT: TOPICAL_OINTMENT | CUTANEOUS | 3 refills | Status: DC

## 2016-01-01 NOTE — Telephone Encounter (Signed)
Left detailed message on DPR voicemail referral in process.

## 2016-01-01 NOTE — Progress Notes (Signed)
Dear Dr. Derrel Ramirez,  Thank you for referring Nathan Ramirez to the [facility] [place] on 01/01/2016.   Below is a summation of this patient's evaluation and recommendations.  Thank you for your referral. I will keep you informed about this patient's response to treatment.   If you have any questions please do not hesitate to contact me.   Sincerely,  Nathan Prows, MD [place]   ______________________________________________________________________    NEW PATIENT NOTE  Referring Provider: Crecencio Mc, MD Primary Provider: Crecencio Mc, MD Date of office visit: 01/01/2016    Subjective:   Chief Complaint:  Nathan Ramirez (DOB: 1945/02/27) is a 70 y.o. male who presents to the clinic on 01/01/2016 with a chief complaint of Other (Hands crack open.) .     HPI: Nathan Ramirez presents to this clinic in evaluation of a dermatitis affecting his hands over the course of the past year. Nathan Ramirez has a history of developing winter time scale and dryness on his hands but last winter he developed a dermatitis on his hands that was defined as red and swollen and painful and cracking with lots of scale that never completely resolved. He has been to 2 dermatologist regarding this issue. Most recently Dr. Pryor Ramirez gave him oral steroids for 30 days which completely cleared up his dermatitis. He developed a relapse within approximately 2 weeks after prednisone and then he was subsequently given acitretin which did not help and actually it sounds as though he developed a diffuse dermatitis secondary to the use of this agent. He stopped this medication 2 weeks ago. There is no obvious provoking factor giving rise to this issue. He has worked 3 different jobs during this timeframe. He stays away from all antibacterial soaps and only uses Dove soap and water to clean his hands. 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 He's tried latex gloves and neoprene gloves and now is using nylon gloves. He does not have contact with mangoes or cashew apples.  In addition to his inflammatory dermatosis he also appears to have asthma of long-standing nature that he believes is under very good control while using Advair consistently. When using Advair consistently he rarely uses a short acting bronchodilator and he can exert himself without any problem. In addition, he does have issues with rhinitis and with nasal congestion and sneezing and has received immunotherapy several years ago which resulted in very good control this issue although he still has some occasional nasal congestion and sneezing. Apparently he is allergic to dust and grass and pollens and mold  Most recently he developed a community-acquired pneumonia manifested as fever and coughing and wheezing and apparently had a chest x-ray which identified an infiltrate and he was treated with a combination of systemic steroids and Rocephin injection and an oral antibiotic and he has improved tremendously. He still has 3 days of steroids left. His hands have improved during the steroid administration.  Past Medical History:  Diagnosis Date  . Allergy   . Arthritis   . Asthma    "allergic"  . Carcinoma, basal cell, skin 2015   thigh  . Chicken pox   . Colon polyp 2011?   pathology unknown  . Elevated blood pressure reading   . GERD (gastroesophageal reflux disease)    never had EGD  . Headache(784.0)    occasional tension  . Hypercholesteremia  borderline  . PONV (postoperative nausea and vomiting)     Past Surgical History:  Procedure Laterality Date  . APPENDECTOMY  1972  . COLONOSCOPY  2011   Dr. Ernst Ramirez  . JOINT REPLACEMENT Bilateral 2015   knee  . KNEE ARTHROSCOPY  2009& 2010   RIGHT AND LEFT KNEE  . LUMBAR [place] SURGERY  2008  . TOTAL KNEE ARTHROPLASTY Bilateral 12/01/2012   Procedure: TOTAL KNEE BILATERAL;  Surgeon: Nathan Alf,  MD;  Location: [facility];  Service: Orthopedics;  Laterality: Bilateral;    Allergies as of 01/01/2016      Reactions   Acitretin Rash      Medication List      acitretin 25 MG capsule Commonly known as:  SORIATANE Take 1 capsule by mouth daily.   ADVAIR DISKUS 250-50 MCG/DOSE Aepb Generic drug:  Fluticasone-Salmeterol Inhale 1 puff into the lungs 2 (two) times daily.   albuterol 108 (90 Base) MCG/ACT inhaler Commonly known as:  PROVENTIL HFA Inhale 2 puffs into the lungs every 6 (six) hours as needed for wheezing or shortness of breath.   albuterol (2.5 MG/3ML) 0.083% nebulizer solution Commonly known as:  PROVENTIL Take 3 mLs (2.5 mg total) by nebulization every 6 (six) hours as needed for wheezing or shortness of breath.   augmented betamethasone dipropionate 0.05 % ointment Commonly known as:  DIPROLENE-AF Apply to affected area as needed.   azithromycin 250 MG tablet Commonly known as:  ZITHROMAX Z-PAK Take 2 tablets (500 mg) on  Day 1,  followed by 1 tablet (250 mg) once daily on Days 2 through 5.   betamethasone valerate 0.1 % cream Commonly known as:  VALISONE Apply topically 2 (two) times daily.   HYDROcodone-homatropine 5-1.5 MG/5ML syrup Commonly known as:  HYCODAN Take 5 mLs by mouth at bedtime as needed for cough.   lansoprazole 15 MG capsule Commonly known as:  PREVACID Take 15 mg by mouth daily at 12 noon.   meloxicam 15 MG tablet Commonly known as:  MOBIC TAKE ONE TABLET BY MOUTH ONCE DAILY   MULTI FOR HIM 50+ Tabs Take 1 tablet by mouth daily.   predniSONE 10 MG tablet Commonly known as:  DELTASONE Take 1 tablet (10 mg total) by mouth daily.   Red Yeast Rice Extract 600 MG Caps Take 1 capsule by mouth 2 (two) times daily.   UTOPIC 41 % Crea Generic drug:  Urea Apply 1 application topically daily.       Review of systems negative except as noted in HPI / PMHx or noted below:  Review of Systems  Constitutional: Negative.   HENT:  Negative.   Eyes: Negative.   Respiratory: Negative.   Cardiovascular: Negative.   Gastrointestinal: Negative.   Genitourinary: Negative.   Musculoskeletal: Negative.   Skin: Negative.   Neurological: Negative.   Endo/Heme/Allergies: Negative.   Psychiatric/Behavioral: Negative.     Family History  Problem Relation Age of Onset  . COPD Mother   . Lung cancer Father   . Prostate cancer Father   . Diabetes Sister     Social History   Social History  . Marital status: Married    Spouse name: N/A  . Number of children: N/A  . Years of education: N/A   Occupational History  . Not on file.   Social History Main Topics  . Smoking status: Former Smoker    Years: 40.00    Types: Cigarettes    Quit date: 01/14/1988  . Smokeless tobacco: Former  User  . Alcohol use 0.0 oz/week     Comment: OCCASSIONALLY  . Drug use: No  . Sexual activity: Not on file   Other Topics Concern  . Not on file   Social History Narrative  . No narrative on file    Environmental and Social history  Lives in a house with a dry environment, a small dog located inside the household, carpeting in the bedroom, no plastic on the bed but plastic on the pillow, and no smokers located inside the household. 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  Objective:   Vitals:   01/01/16 0835  BP: 110/64  Pulse: 72  Resp: 16  Temp: 97.7 F (36.5 C)   Height: 5\' 9"  (175.3 cm) Weight: 236 lb 3.2 oz (107.1 kg)  Physical Exam  Constitutional: He is well-developed, well-nourished, and in no distress.  HENT:  Head: Normocephalic. Head is without right periorbital erythema and without left periorbital erythema.  Right Ear: Tympanic membrane, external ear and ear canal normal.  Left Ear: Tympanic membrane, external ear and ear canal normal.  Nose: Nose normal. No mucosal edema or rhinorrhea.  Mouth/Throat: Oropharynx is clear  and moist and mucous membranes are normal. No oropharyngeal exudate.  Eyes: Conjunctivae and lids are normal. Pupils are equal, round, and reactive to light.  Neck: Trachea normal. No tracheal deviation present. No thyromegaly present.  Cardiovascular: Normal rate, regular rhythm, S1 normal, S2 normal and normal heart sounds.   No murmur heard. Pulmonary/Chest: Effort normal. No stridor. No tachypnea. No respiratory distress. He has no wheezes. He has no rales. He exhibits no tenderness.  Abdominal: Soft. He exhibits no distension and no mass. There is no hepatosplenomegaly. There is no tenderness. There is no rebound and no guarding.  Musculoskeletal: He exhibits no edema or tenderness.  Lymphadenopathy:       Head (right side): No tonsillar adenopathy present.       Head (left side): No tonsillar adenopathy present.    He has no cervical adenopathy.    He has no axillary adenopathy.  Neurological: He is alert. Gait normal.  Skin: Rash (Diffusely erythematous and indurated palmar surface of both hands with cracked fissures and diffuse scale.) noted. He is not diaphoretic. No erythema. No pallor. Nails show no clubbing.  Psychiatric: Mood and affect normal.    Diagnostics: Allergy skin tests were not performed.   Results of blood tests obtained 10/22/2015 identified normal hepatic and renal function, white blood cell count 9.2 with a normal differential and a total eosinophil count of 300, hemoglobin 14.9 and platelet 327. Results of blood tests obtained on 07/09/2015 identified a normal TSH.  Assessment and Plan:    1. Inflammatory dermatosis   2. Asthma, moderate persistent, well-controlled   3. Other allergic rhinitis     1. Apply water followed by Protopic 0.1% ointment twice a day  2. Blood - SED, IgA/G/M, ANA w reflex  3. Continue Advair 250 one inhalation twice a day  4. Can use OTC Rhinocort one spray each nostril 3-7 times a week  5. Continue Ventolin HFA and OTC  antihistamine if needed  6. Will need patch testing one month after completing oral steroids  7. Return to clinic in 4 weeks or earlier if problem  Obviously Fritz Pickerel has some form of immunological hyperreactivity manifested as very significant inflammatory dermatosis affecting his palmar surface of his hands. This very  well could be a contact dermatitis and will set him up for a patch test once he stops his systemic steroids. In addition, it is quite possible that he has developed a contact dermatitis directed against his betamethasone which we will discontinue and have him use a calcineurin inhibitor as topical therapy. In investigation of possible etiologic factors contributing to this immunological hyperactivity will check blood tests as noted above. Concerning his atopic respiratory disease we'll continue to have him use Advair and I made the suggestion that he add in some over-the-counter nasal steroid to address his upper airway symptoms. I'll regroup with him over the course of the next several weeks to make a determination about further evaluation and treatment pending his response.  Nathan Prows, MD [facility] [place]

## 2016-01-01 NOTE — Patient Instructions (Addendum)
  1. Apply water followed by Protopic 0.1% ointment twice a day  2. Blood - SED, IgA/G/M, ANA w reflex  3. Continue Advair 250 one inhalation twice a day  4. Can use OTC Rhinocort one spray each nostril 3-7 times a week  5. Continue Ventolin HFA and OTC antihistamine if needed  6. Will need patch testing one month after completing oral steroids  7. Return to clinic in 4 weeks or earlier if problem

## 2016-01-02 ENCOUNTER — Telehealth: Payer: Self-pay

## 2016-01-02 LAB — IGG, IGA, IGM
IgA/Immunoglobulin A, Serum: 217 mg/dL (ref 61–437)
IgG (Immunoglobin G), Serum: 924 mg/dL (ref 700–1600)
IgM (Immunoglobulin M), Srm: 188 mg/dL — ABNORMAL HIGH (ref 20–172)

## 2016-01-02 LAB — ANA W/REFLEX IF POSITIVE: Anti Nuclear Antibody(ANA): NEGATIVE

## 2016-01-02 LAB — SEDIMENTATION RATE: Sed Rate: 4 mm/hr (ref 0–30)

## 2016-01-02 MED ORDER — PIMECROLIMUS 1 % EX CREA: TOPICAL_CREAM | Freq: Two times a day (BID) | CUTANEOUS | 0 refills | Status: DC

## 2016-01-02 NOTE — Telephone Encounter (Signed)
[facility] sent fax stating that the Protopic 0.1% will cost the patient 331.00, which is too high. Please advise on the alternative and thank you.

## 2016-01-02 NOTE — Telephone Encounter (Signed)
Is that the generic? Can use generic protopic or generic elidil

## 2016-01-02 NOTE — Telephone Encounter (Signed)
Generic Elidel sent to pharmacy.

## 2016-01-03 NOTE — Telephone Encounter (Signed)
Other topical agents would be steroids and we need to use a nonsteroid anti-inflammatory agent such as Elidel. I would encourage her to call around 2 pharmacies to find the lowest dose for this drug. I suspect that the independent pharmacies may be cheaper.

## 2016-01-03 NOTE — Telephone Encounter (Signed)
Pt states that the Elidel is still over $100. She can not afford anything this expensive. Please advise another alternative? The pt's budget is very strict at this moment due to medical expenses.

## 2016-01-03 NOTE — Telephone Encounter (Signed)
Advised pt to call around to different pharmacies.

## 2016-01-09 ENCOUNTER — Telehealth: Payer: Self-pay | Admitting: Allergy and Immunology

## 2016-01-09 NOTE — Telephone Encounter (Signed)
Pt wife called and said that the predisone was out of his system and his hands have started to crack opening and hurt. He is using the elidel and it cost over $100.00 and they need something that will help him but dont cost a loy of money. [facility] [address] . [phone].

## 2016-01-09 NOTE — Telephone Encounter (Signed)
Please inform patient that he can use mometasone 0.1% ointment OVER the Elidil.

## 2016-01-10 ENCOUNTER — Other Ambulatory Visit: Payer: Self-pay | Admitting: *Deleted

## 2016-01-10 MED ORDER — MOMETASONE FUROATE 0.1 % EX OINT: TOPICAL_OINTMENT | CUTANEOUS | 5 refills | Status: DC

## 2016-01-10 NOTE — Telephone Encounter (Signed)
Please contact wife with the previous note concerning topical Mometasone.

## 2016-01-10 NOTE — Telephone Encounter (Signed)
Patients wife informed of instructions. 

## 2016-01-10 NOTE — Telephone Encounter (Signed)
Pt wife called back this morning and said that she has not heard any thing from you and to let you know that he is braking out and she thinks it could be his new uniforms. She said that the er dr gave him predisone and it help some.

## 2016-01-11 ENCOUNTER — Other Ambulatory Visit: Payer: Self-pay | Admitting: Internal Medicine

## 2016-01-16 DIAGNOSIS — L308 Other specified dermatitis: Secondary | ICD-10-CM | POA: Diagnosis not present

## 2016-01-18 ENCOUNTER — Telehealth: Payer: Self-pay | Admitting: Internal Medicine

## 2016-01-18 DIAGNOSIS — J209 Acute bronchitis, unspecified: Secondary | ICD-10-CM

## 2016-01-18 MED ORDER — ALBUTEROL SULFATE HFA 108 (90 BASE) MCG/ACT IN AERS: 2.0000 | INHALATION_SPRAY | Freq: Four times a day (QID) | RESPIRATORY_TRACT | 1 refills | Status: DC | PRN

## 2016-01-18 NOTE — Telephone Encounter (Signed)
Refilled sent.

## 2016-01-18 NOTE — Telephone Encounter (Signed)
Pt called about needing the medication for the inhaler it came from the ER. Please advise? Ventolin HFA is the name of the medication. Albuterol suffiate solution.   Not for the nebulizer!  Pharmacy is [facility], [address]  Call pt @ [phone]. Thank you!

## 2016-01-18 NOTE — Telephone Encounter (Signed)
PT wife called and stated that he needs a refill on albuterol (PROVENTIL HFA) 108 (90 Base) MCG/ACT inhaler and albuterol (PROVENTIL) (2.5 MG/3ML) 0.083% nebulizer solution. He is just about out and these were prescribed when he was in the hospital with pno. Please call when completed. Please advise, thank you!  Pharmacy - [facility], [address]  Call @ [phone]

## 2016-01-21 ENCOUNTER — Telehealth: Payer: Self-pay | Admitting: Internal Medicine

## 2016-01-21 NOTE — Telephone Encounter (Signed)
Pt spouse called and stated that the refill that they got on an inhaler without a counter on the back and it does not look like the inhaler that they have received. They want to make sure that this is the same inhaler. They got ventolin hfa while in the hospital and they received albuterol (PROVENTIL HFA) 108 (90 Base) MCG/ACT inhaler. They also stated that pt's sister goes to [facility] and she gets home health and she is able to get her inhalers and nebulizer solution for free, and wanted to know if you know anything about it. Please advise, thank you!  Call pt @ [phone]

## 2016-01-21 NOTE — Telephone Encounter (Signed)
The medication is the same,  The delivery (MDI) is different and governed by insurance.  Albuterol nebulizers can be obtained and the medication that goes in them is covered under Mediac[employer] part D, so it is not technically free.  Since I have not seen him since November,  And I believe he has a pulmonology referral in process,  I will defer to his future pulmonologist as to whether he should have a home nebulizer. Marland Kitchen

## 2016-01-22 ENCOUNTER — Emergency Department
Admission: EM | Admit: 2016-01-22 | Discharge: 2016-01-22 | Disposition: A | Discharge status: Home / Self Care | Payer: Medicare Other | Attending: Emergency Medicine | Admitting: Emergency Medicine

## 2016-01-22 ENCOUNTER — Emergency Department: Payer: Medicare Other

## 2016-01-22 DIAGNOSIS — J45909 Unspecified asthma, uncomplicated: Secondary | ICD-10-CM | POA: Insufficient documentation

## 2016-01-22 DIAGNOSIS — Z79899 Other long term (current) drug therapy: Secondary | ICD-10-CM | POA: Insufficient documentation

## 2016-01-22 DIAGNOSIS — Y999 Unspecified external cause status: Secondary | ICD-10-CM | POA: Insufficient documentation

## 2016-01-22 DIAGNOSIS — W228XXA Striking against or struck by other objects, initial encounter: Secondary | ICD-10-CM | POA: Diagnosis not present

## 2016-01-22 DIAGNOSIS — S59911A Unspecified injury of right forearm, initial encounter: Secondary | ICD-10-CM | POA: Diagnosis not present

## 2016-01-22 DIAGNOSIS — Z87891 Personal history of nicotine dependence: Secondary | ICD-10-CM | POA: Insufficient documentation

## 2016-01-22 DIAGNOSIS — Y9389 Activity, other specified: Secondary | ICD-10-CM | POA: Diagnosis not present

## 2016-01-22 DIAGNOSIS — Y929 Unspecified place or not applicable: Secondary | ICD-10-CM | POA: Diagnosis not present

## 2016-01-22 DIAGNOSIS — S4991XA Unspecified injury of right shoulder and upper arm, initial encounter: Secondary | ICD-10-CM | POA: Insufficient documentation

## 2016-01-22 DIAGNOSIS — M79631 Pain in right forearm: Secondary | ICD-10-CM | POA: Diagnosis not present

## 2016-01-22 MED ORDER — ACETAMINOPHEN 500 MG PO TABS
500.0000 mg | ORAL_TABLET | Freq: Once | ORAL | Status: AC
  Administered 2016-01-22: 500 mg via ORAL
  Filled 2016-01-22: qty 1

## 2016-01-22 MED ORDER — COAL TAR EXTRACT 10 % EX OINT: 1.0000 g | TOPICAL_OINTMENT | Freq: Two times a day (BID) | CUTANEOUS | 0 refills | Status: DC

## 2016-01-22 MED ORDER — TRAMADOL HCL 50 MG PO TABS: 25.0000 mg | ORAL_TABLET | Freq: Four times a day (QID) | ORAL | 0 refills | Status: DC | PRN

## 2016-01-22 NOTE — Telephone Encounter (Signed)
Yes you can call in the appropriate version of Proventil because I CANNOT tell when I order via EPIC which one has the counter.  They all say the same thing.

## 2016-01-22 NOTE — Telephone Encounter (Signed)
Spoke with patient wife she state he does not feel like he is getting the full affect of the medication and is not sure if he is receiving the medication right. Would like a rx for the one that has the counter and away to know if he could see if it last up until their appointment  for their pulmonologist. Please advise  albuterol (PROVENTIL HFA) 108 (90 Base) MCG/ACT inhaler

## 2016-01-22 NOTE — ED Provider Notes (Signed)
[facility] Emergency Department Provider Note  ____________________________________________  Time seen: Approximately 7:48 PM  I have reviewed the triage vital signs and the nursing notes.   HISTORY  Chief Complaint Arm Injury    HPI Nathan Ramirez is a 71 y.o. male that presents to the emergency department after hitting forearm on a horse water troughs. Patient states that it is difficult to rotate his wrist. Patient denies any pain without rotating wrist. Patient denies any swelling or bruising. Patient took ibuprofen for pain, which helped. Patient denies headache, shortness of breath, chest pain, abdominal pain, nausea, vomiting, numbness, tingling.   Past Medical History:  Diagnosis Date  . Allergy   . Arthritis   . Asthma    "allergic"  . Carcinoma, basal cell, skin 2015   thigh  . Chicken pox   . Colon polyp 2011?   pathology unknown  . Elevated blood pressure reading   . GERD (gastroesophageal reflux disease)    never had EGD  . Headache(784.0)    occasional tension  . Hypercholesteremia    borderline  . PONV (postoperative nausea and vomiting)     Patient Active Problem List   Diagnosis Date Noted  . Acute bronchitis 10/23/2015  . Polyarthritis of multiple sites ([place]) 07/10/2015  . Conjunctivitis 04/05/2015  . Plaque psoriasis 02/16/2015  . Special screening for malignant neoplasms, colon 06/03/2014  . History of colonic polyps 06/03/2014  . Status post total bilateral knee replacement 12/06/2012  . OA (osteoarthritis) of knee 12/01/2012  . Medicare welcome visit 11/19/2012  . Other and unspecified hyperlipidemia 11/17/2012  . Irritable bowel syndrome 10/01/2012  . GERD (gastroesophageal reflux disease) 09/19/2012  . Medicare annual wellness visit, subsequent 06/15/2012  . Hyperlipidemia LDL goal <100 11/17/2011  . Obesity (BMI 30.0-34.9) 11/17/2011  . ALLERGIC RHINITIS CAUSE UNSPECIFIED 02/20/2010  . Asthma 02/20/2010     Past Surgical History:  Procedure Laterality Date  . APPENDECTOMY  1972  . COLONOSCOPY  2011   Dr. Ernst Breach  . JOINT REPLACEMENT Bilateral 2015   knee  . KNEE ARTHROSCOPY  2009& 2010   RIGHT AND LEFT KNEE  . LUMBAR [place] SURGERY  2008  . TOTAL KNEE ARTHROPLASTY Bilateral 12/01/2012   Procedure: TOTAL KNEE BILATERAL;  Surgeon: Gearlean Alf, MD;  Location: [facility];  Service: Orthopedics;  Laterality: Bilateral;    Prior to Admission medications   Medication Sig Start Date End Date Taking? Authorizing Provider  ADVAIR DISKUS 250-50 MCG/DOSE AEPB INHALE ONE DOSE BY MOUTH TWICE DAILY 01/11/16   Crecencio Mc, MD  albuterol (PROVENTIL HFA) 108 (90 Base) MCG/ACT inhaler Inhale 2 puffs into the lungs every 6 (six) hours as needed for wheezing or shortness of breath. 01/18/16   Crecencio Mc, MD  albuterol (PROVENTIL) (2.5 MG/3ML) 0.083% nebulizer solution Take 3 mLs (2.5 mg total) by nebulization every 6 (six) hours as needed for wheezing or shortness of breath. 12/25/15   Crecencio Mc, MD  aspirin 81 MG tablet Take 81 mg by mouth daily.    Historical Provider, MD  augmented betamethasone dipropionate (DIPROLENE-AF) 0.05 % ointment Apply to affected area as needed. 11/02/15   Historical Provider, MD  betamethasone valerate (VALISONE) 0.1 % cream Apply topically 2 (two) times daily.    Historical Provider, MD  Cholecalciferol (VITAMIN D PO) Take by mouth daily.    Historical Provider, MD  [employer] Extract 10 % OINT Apply 1 g topically 2 (two) times daily. 01/22/16   Laban Emperor,  PA-C  IBUPROFEN PO Take by mouth 2 (two) times daily.    Historical Provider, MD  mometasone (ELOCON) 0.1 % ointment Apply as directed to affected areas twice daily 01/10/16   Jiles Prows, MD  Multiple Vitamins-Minerals [facility] FOR HIM 50+) TABS Take 1 tablet by mouth daily.    Historical Provider, MD  Omega-3 Fatty Acids (FISH OIL PO) Take by mouth.    Historical Provider, MD  pimecrolimus (ELIDEL) 1 % cream  Apply topically 2 (two) times daily. 01/02/16   Jiles Prows, MD  Red Yeast Rice Extract 600 MG CAPS Take 1 capsule by mouth 2 (two) times daily.    Historical Provider, MD  tacrolimus (PROTOPIC) 0.1 % ointment Apply to affected skin twice daily as directed. 01/01/16   Jiles Prows, MD    Allergies Tamiflu [oseltamivir phosphate] and Acitretin  Family History  Problem Relation Age of Onset  . COPD Mother   . Lung cancer Father   . Prostate cancer Father   . Diabetes Sister     Social History Social History  Substance Use Topics  . Smoking status: Former Smoker    Years: 40.00    Types: Cigarettes    Quit date: 01/14/1988  . Smokeless tobacco: Former Systems developer  . Alcohol use 0.0 oz/week     Comment: OCCASSIONALLY     Review of Systems  Constitutional: No fever/chills ENT: No upper respiratory complaints. Cardiovascular: No chest pain. Respiratory: No SOB. Gastrointestinal: No abdominal pain.  No nausea, no vomiting.  Skin: Negative for rash, abrasions, lacerations, ecchymosis. Neurological: Negative for headaches, numbness or tingling   ____________________________________________   PHYSICAL EXAM:  VITAL SIGNS: ED Triage Vitals  Enc Vitals Group     BP 01/22/16 1738 129/86     Pulse Rate 01/22/16 1738 76     Resp 01/22/16 1738 18     Temp 01/22/16 1738 98.2 F (36.8 C)     Temp Source 01/22/16 1738 Oral     SpO2 01/22/16 1738 100 %     Weight 01/22/16 1739 235 lb (106.6 kg)     Height 01/22/16 1739 5\' 9"  (1.753 m)     Head Circumference --      Peak Flow --      Pain Score 01/22/16 1739 7     Pain Loc --      Pain Edu? --      Excl. in [place]? --      Constitutional: Alert and oriented. Well appearing and in no acute distress. Eyes: Conjunctivae are normal. PERRL. EOMI. Head: Atraumatic. ENT:      Ears:      Nose: No congestion/rhinnorhea.      Mouth/Throat: Mucous membranes are moist.  Neck: No stridor.  Cardiovascular: Normal rat.  Good peripheral  circulation. 2+ radial pulses. Respiratory: Normal respiratory effort without tachypnea or retractions. Lungs CTAB. Good air entry to the bases with no decreased or absent breath sounds. Musculoskeletal: Tenderness to palpation over the dorsal side of the forearm. Mild pain with supination and pronation. Full range of motion of elbow, wrist, fingers. Neurologic:  Normal speech and language. No gross focal neurologic deficits are appreciated.  Skin:  Skin is warm, dry and intact. No rash noted. Psychiatric: Mood and affect are normal. Speech and behavior are normal. Patient exhibits appropriate insight and judgement.   ____________________________________________   LABS (all labs ordered are listed, but only abnormal results are displayed)  Labs Reviewed - No data to display ____________________________________________  EKG   ____________________________________________  RADIOLOGY Robinette Haines, personally viewed and evaluated these images (plain radiographs) as part of my medical decision making, as well as reviewing the written report by the radiologist.  Dg Forearm Right  Result Date: 01/22/2016 CLINICAL DATA:  Sharp pain in forearm after lifting injury. EXAM: RIGHT FOREARM - 2 VIEW COMPARISON:  None. FINDINGS: There is no evidence of fracture or other focal bone lesions. Soft tissues are unremarkable. IMPRESSION: Negative. Electronically Signed   By: Elon Alas M.D.   On: 01/22/2016 18:09    ____________________________________________    PROCEDURES  Procedure(s) performed:    Procedures    Medications  acetaminophen (TYLENOL) tablet 500 mg (500 mg Oral Given 01/22/16 1939)     ____________________________________________   INITIAL IMPRESSION / ASSESSMENT AND PLAN / ED COURSE  Pertinent labs & imaging results that were available during my care of the patient were reviewed by me and considered in my medical decision making (see chart for  details).  Review of the [place] CSRS was performed in accordance of the [place] prior to dispensing any controlled drugs.  Clinical Course     Patient's diagnosis is consistent with arm strain. No acute bony abnormalities on x-ray.  Exam and vital signs are reassuring. Patient was given Tylenol in ED for pain.  Arm was ace wrapped and placed in sling. Patient was given a prescription for coal tar for dry cracking hands and has been to the dermatologist multiple times without relief. Patient was also given a prescription for tramadol for arm pain. Patient is to follow up with PCP as directed. Patient is given ED precautions to return to the ED for any worsening or new symptoms.     ____________________________________________  FINAL CLINICAL IMPRESSION(S) / ED DIAGNOSES  Final diagnoses:  Injury of right upper extremity, initial encounter      NEW MEDICATIONS STARTED DURING THIS VISIT:  New Prescriptions   COAL TAR EXTRACT 10 % OINT    Apply 1 g topically 2 (two) times daily.        This chart was dictated using voice recognition software/Dragon. Despite best efforts to proofread, errors can occur which can change the meaning. Any change was purely unintentional.    Laban Emperor, PA-C 01/22/16 2022    Orbie Pyo, MD 01/22/16 [phone]

## 2016-01-22 NOTE — ED Triage Notes (Addendum)
Pt arrives to ER after right forearm injury. No obvious deformity. Pt alert and oriented X4, active, cooperative, pt in NAD. RR even and unlabored, color WNL.

## 2016-01-23 ENCOUNTER — Telehealth: Payer: Self-pay | Admitting: Internal Medicine

## 2016-01-23 ENCOUNTER — Telehealth: Payer: Self-pay

## 2016-01-23 MED ORDER — ALBUTEROL SULFATE HFA 108 (90 BASE) MCG/ACT IN AERS: 2.0000 | INHALATION_SPRAY | Freq: Four times a day (QID) | RESPIRATORY_TRACT | 1 refills | Status: DC | PRN

## 2016-01-23 NOTE — Telephone Encounter (Signed)
Pro air called in for patient pharmacy will notify patient.

## 2016-01-23 NOTE — Telephone Encounter (Signed)
Yes, but the same rx comes up in EPIC regardless of the name brand  So please call in the pro air

## 2016-01-23 NOTE — Telephone Encounter (Signed)
Sent in Ventolin and DC your proventil since it has a counter

## 2016-01-23 NOTE — Telephone Encounter (Signed)
Insurance will not cover proventil inhaler can we try Proair?

## 2016-01-25 ENCOUNTER — Telehealth: Payer: Self-pay | Admitting: Internal Medicine

## 2016-01-25 ENCOUNTER — Other Ambulatory Visit: Payer: Self-pay

## 2016-01-25 ENCOUNTER — Telehealth: Payer: Self-pay | Admitting: Allergy and Immunology

## 2016-01-25 MED ORDER — TACROLIMUS 0.1 % EX OINT
TOPICAL_OINTMENT | CUTANEOUS | 3 refills | Status: DC
Start: 2016-01-25 — End: 2016-04-10

## 2016-01-25 NOTE — Telephone Encounter (Signed)
Did you call for pick up

## 2016-01-25 NOTE — Telephone Encounter (Signed)
Wok note for today written

## 2016-01-25 NOTE — Telephone Encounter (Signed)
Left detailed message on marsha phone informing her to come pick up written note at the front desk for patient.

## 2016-01-25 NOTE — Telephone Encounter (Signed)
Prescription has been resent to the correct pharmacy.

## 2016-01-25 NOTE — Telephone Encounter (Signed)
Pt wife called and said that his meds need to be done through [facility] [place].

## 2016-01-25 NOTE — Telephone Encounter (Signed)
Pt wanted to let you know from today on [facility]. Any questions call , [phone].

## 2016-01-25 NOTE — Telephone Encounter (Signed)
Pt wife called back.

## 2016-01-25 NOTE — Telephone Encounter (Signed)
Wife called and said insurance is not wanting to pay for a medication unless our office files for an exception. Medication is an ointment.

## 2016-01-25 NOTE — Telephone Encounter (Signed)
Patient notified

## 2016-01-25 NOTE — Telephone Encounter (Signed)
Needs work note for severe sprain to right arm, Just for today .

## 2016-01-28 NOTE — Telephone Encounter (Signed)
Change primary pharmacy [facility]

## 2016-01-28 NOTE — Telephone Encounter (Signed)
Can attempt to acquire Elidel followed by Vaseline twice a day. Will need to see what the cost will be available until at the pharmacy.

## 2016-01-28 NOTE — Telephone Encounter (Signed)
Wife called back and they went to pick up the medication and it is $217. They cannot afford that and would like a nurse to call back and speak to her and suggest an alternative.

## 2016-01-28 NOTE — Telephone Encounter (Signed)
Left message for patient to call.  Was the Protopic covered in December when they first got the Rx?  I think Prior approval just went through the end of 2017. We can try to get it authorized again for 2018.  If the Rx from December was still too much, Patient can try to get the Elidel and use it followed by Vaseline twice daily (See previous message).

## 2016-01-28 NOTE — Telephone Encounter (Signed)
Spoke with wife. States that husband sustained an injury from their horses and has had pneumonia recently. They are unable to afford the Protopic [id number]) Please advise on alternative and thank you.

## 2016-01-29 NOTE — Telephone Encounter (Signed)
Called and talked with Nathan Ramirez. Patient is really having trouble with his hands.  They were able to get a small tube of the Tacrolimus and started using that yesterday. I advised her to have Nathan Ramirez wet hands first and then put the ointment on top. Are there any other therapies he could try? Patient is scheduled to have patch test at end of month. Please advise.

## 2016-01-29 NOTE — Telephone Encounter (Signed)
Wife called and said they picked up a small tube of Tarolimus at the pharmacy. Said she cannot afford any more and would really like a solution to his cracking hands. She said he is miserable. She said she missed a call from here yesterday evening and tried calling back, but it was after 5. She said she doesn't answer calls with no caller ID that is why she missed the call.

## 2016-01-31 NOTE — Telephone Encounter (Signed)
Please inform patient that he should come to the [facility] clinic to have a patch test placed on Monday. Once his patch test is completed which will be Wednesday morning we can think about giving him other medications for his hands. For now he should use the Protopic twice a day after wetting his hands. Protopic is a very slow onset medication and there will probably not be optimal response for several weeks. If he could, at 8:30 on Monday morning to have the patch test placed that would be best. If he can come at 8:30 please send a message to [facility] telling the clinic that he will arrive at 8:30 for a patch test.

## 2016-01-31 NOTE — Telephone Encounter (Signed)
Wrote note to [facility] informing them of patch test appointment.

## 2016-01-31 NOTE — Telephone Encounter (Signed)
Patient and wife informed of plan.  Patient will come in to [facility] office Monday at 4:30 to have patch test placed. Then he will return on Wednesday at 4:30 to have patch test read, per Dr.Kozlow.

## 2016-02-01 NOTE — Telephone Encounter (Signed)
Spoke with Rosann Auerbach, patient's wife. I informed that we would not be able to do the patch test 02-04-16 due to scheduling issues. Advised that we could do it the following Monday, 02-11-16, at 830 but not at 430 because he would need to come in Mon, Wed, Fri per patch testing protocol. She became irritated and told me that there was no way he could come in for an appointment before 430 because of a new job that he had already been out for 5 weeks due to medical issues. I advised her to let me go speak with clinic manager. After speaking with clinic manager I advised patient's wife that the patient would have to come in on a Monday morning at 59, then again at 22 on Wednesday and Friday. She proceeded to tell me again about new job and not being able to come. I told her that I could have clinic manager call her back but the outcome would more than likely be the same as what I had already told her. She stated that if it was going to be the same and she was going to have to argue with the clinic manager then what was the point. Also told me that we needed to fix this and come up with a plan B because of how long he has had to wait for the appointment. I went and spoke with clinic manager again and per her orders advised wife that someone from the office would call her back with further information.    Call wife back at [phone]

## 2016-02-01 NOTE — Telephone Encounter (Signed)
Spoke with pts wife Nathan Ramirez.  Pt is unable to come to office for Patch testing until 4:30 pm each time due to patient already missing 5 weeks of work due to medical reasons.  I have set pt up to have testing done on days we have night clinic to accommodate patient and his work schedule.  Explained to wife that was the best way to accommodate his work schedule so he would not have to miss any work.  She is aware that his last return visit will be a longer time frame for last patch read.  Appointments are made for patient for Jan. 30 for patch placement and Feb. 1 and Feb. 5 for patch reading.  The patch schedule was discussed with Dr. Nelva Bush who will be seeing patient on Feb. 1 and 5th.  Pts wife was appreciative and in agreement with the appointments.

## 2016-02-11 ENCOUNTER — Telehealth: Payer: Self-pay

## 2016-02-11 NOTE — Telephone Encounter (Signed)
Change pharmacy [facility]

## 2016-02-12 ENCOUNTER — Encounter (INDEPENDENT_AMBULATORY_CARE_PROVIDER_SITE_OTHER): Payer: Self-pay

## 2016-02-12 ENCOUNTER — Ambulatory Visit (INDEPENDENT_AMBULATORY_CARE_PROVIDER_SITE_OTHER): Payer: Medicare Other | Admitting: Allergy and Immunology

## 2016-02-12 DIAGNOSIS — L308 Other specified dermatitis: Secondary | ICD-10-CM

## 2016-02-12 DIAGNOSIS — L989 Disorder of the skin and subcutaneous tissue, unspecified: Secondary | ICD-10-CM

## 2016-02-12 NOTE — Progress Notes (Addendum)
Levii returns to this clinic Patch testing placed. The true test patch test was placed on his back with a total 36 allergens. He'll return to this clinic in 48 hours for initial read.  -------------------------------------------------  Jenny Reichmann returns to the office today to 02/14/2016 for the initial patch test interpretation, given suspected history of contact dermatitis.    Diagnostics:  TRUE TEST 48 hour reading:  Erythema with scaling fragrance mix #6;  Erythema with papules of Quaternium #18;  erythema with scale PPD #20; mild erythema quinoline mix #26  Plan:  Allergic contact dermatitis  The patient has been provided detailed information regarding the substances he is sensitive to, as well as products containing the substances.  Meticulous avoidance of these substances is recommended. If avoidance is not possible, the use of barrier creams or lotions is recommended.   He will return on Monday to 02/18/2016 for her last reading If symptoms persist or progress despite meticulous avoidance of the above allergens, dermatology evaluation may be warranted.  Prudy Feeler, MD [facility] --------------------------------------------------------- Dionel returns to the office today for the final patch test interpretation, given suspected history of contact dermatitis.    Diagnostics:  TRUE TEST 96+ hour reading:  No new positive areas and areas that were positive at 48 hours are now resolved  Assessment and Plan:   As above

## 2016-02-13 ENCOUNTER — Encounter: Payer: Self-pay | Admitting: Allergy and Immunology

## 2016-02-14 ENCOUNTER — Ambulatory Visit: Payer: Medicare Other | Admitting: Allergy

## 2016-02-15 ENCOUNTER — Ambulatory Visit (INDEPENDENT_AMBULATORY_CARE_PROVIDER_SITE_OTHER): Payer: Medicare Other | Admitting: Emergency Medicine

## 2016-02-15 ENCOUNTER — Encounter: Payer: Self-pay | Admitting: Emergency Medicine

## 2016-02-15 ENCOUNTER — Encounter: Payer: Medicare Other | Admitting: Allergy

## 2016-02-15 VITALS — BP 116/80 | HR 73 | Ht 69.0 in | Wt 226.8 lb

## 2016-02-15 DIAGNOSIS — J45909 Unspecified asthma, uncomplicated: Secondary | ICD-10-CM

## 2016-02-15 DIAGNOSIS — J449 Chronic obstructive pulmonary disease, unspecified: Secondary | ICD-10-CM | POA: Diagnosis not present

## 2016-02-15 MED ORDER — ALBUTEROL SULFATE (2.5 MG/3ML) 0.083% IN NEBU: 2.5000 mg | INHALATION_SOLUTION | Freq: Four times a day (QID) | RESPIRATORY_TRACT | 2 refills | Status: DC | PRN

## 2016-02-15 NOTE — Addendum Note (Signed)
Addended by: Tyson Dense on: 02/15/2016 05:21 PM   Modules accepted: Orders

## 2016-02-15 NOTE — Assessment & Plan Note (Signed)
Stopped at approximately age 71 and a patient with a history of tobacco use. I suspect that this is primarily COPD. He had a recent flare with severe symptoms in the setting of a URI and possible to be acquired pneumonia. He is improved now. I like to arrange for pulmonary function testing to quantify his obstruction. Based on the results we may decide to change his Advair to an alternative. We discussed the proper way to uses albuterol either by neb or by [facility]. We will repeat his CXR to insure clearance of his recent PNA.   We will perform pulmonary function testing at your next office visit We will repeat your CXR at your next visit here.  We will continue Advair 250/50, 2 puffs twice a day Take albuterol 2 puffs up to every 4 hours if needed for shortness of breath.  You may use albuterol nebulized up to every 4 hours if needed.  Follow with Dr Lamonte Sakai next available with PFT on same day.

## 2016-02-15 NOTE — Progress Notes (Signed)
Subjective:    Patient ID: Nathan Ramirez, male    DOB: [birth year], 71 y.o.   MRN: [id number]  HPI 71 year old man with a history of former tobacco use ( 20 pk-yrs), second hand smoke a a child, allergies formerly on imunotherapy, GERD, asthma dx about 18 yrs ago, has been on Advair for years. He has also been treated with Spiriva in the past, not currently. He had a flare in Dec '17, following a URI, required rx with pred + abx. He averages an AE about 4x a year. He has been having trouble with dermatitis on hands, has started seeing allergist lately. He also has GERD every day, is on lansoprazole every day.  He is not limited by breathing unless exacerbated. No cough, congestion, wheeze unless flaring. Other triggers include smoke, sometimes dust.    Review of Systems  Constitutional: Negative.  Negative for fever and unexpected weight change.  HENT: Negative for congestion, dental problem, ear pain, nosebleeds, postnasal drip, rhinorrhea, sinus pressure, sneezing, sore throat and trouble swallowing.   Eyes: Negative.  Negative for redness and itching.  Respiratory: Positive for cough, shortness of breath and wheezing. Negative for chest tightness.   Cardiovascular: Negative.  Negative for palpitations and leg swelling.  Gastrointestinal: Negative.  Negative for nausea and vomiting.  Genitourinary: Negative for dysuria.  Musculoskeletal: Negative.  Negative for joint swelling.  Skin: Negative.  Negative for rash.  Neurological: Negative for headaches.  Hematological: Negative.  Does not bruise/bleed easily.  Psychiatric/Behavioral: Negative.  Negative for dysphoric mood. The patient is not nervous/anxious.     Past Medical History:  Diagnosis Date  . Allergy   . Arthritis   . Asthma    "allergic"  . Carcinoma, basal cell, skin 2015   thigh  . Chicken pox   . Colon polyp 2011?   pathology unknown  . Elevated blood pressure reading   . GERD (gastroesophageal reflux disease)    never had EGD  . Headache(784.0)    occasional tension  . Hypercholesteremia    borderline  . PONV (postoperative nausea and vomiting)      Family History  Problem Relation Age of Onset  . COPD Mother   . Lung cancer Father   . Prostate cancer Father   . Diabetes Sister      Social History   Social History  . Marital status: Married    Spouse name: N/A  . Number of children: N/A  . Years of education: N/A   Occupational History  . Not on file.   Social History Main Topics  . Smoking status: Former Smoker    Packs/day: 1.00    Years: 40.00    Types: Cigarettes    Quit date: 01/14/1988  . Smokeless tobacco: Former Systems developer  . Alcohol use 0.0 oz/week     Comment: OCCASSIONALLY  . Drug use: No  . Sexual activity: Not on file   Other Topics Concern  . Not on file   Social History Narrative  . No narrative on file  works as a Dealer / maintenance of heavy machinery.  Was [place], no known inhaled exposures there.  [place] native.   Allergies  Allergen Reactions  . Tamiflu [Oseltamivir Phosphate] Nausea And Vomiting and Other (See Comments)    Severe Headache  . Acitretin Rash     Outpatient Medications Prior to Visit  Medication Sig Dispense Refill  . ADVAIR DISKUS 250-50 MCG/DOSE AEPB INHALE ONE DOSE BY MOUTH TWICE DAILY 60  each 3  . albuterol (PROVENTIL HFA;VENTOLIN HFA) 108 (90 Base) MCG/ACT inhaler Inhale 2 puffs into the lungs every 6 (six) hours as needed for wheezing or shortness of breath. 1 Inhaler 1  . albuterol (PROVENTIL) (2.5 MG/3ML) 0.083% nebulizer solution Take 3 mLs (2.5 mg total) by nebulization every 6 (six) hours as needed for wheezing or shortness of breath. 150 mL 1  . aspirin 81 MG tablet Take 81 mg by mouth daily.    . Cholecalciferol (VITAMIN D PO) Take by mouth daily.    . IBUPROFEN PO Take by mouth 2 (two) times daily.    . Multiple Vitamins-Minerals (MULTI FOR HIM 50+) TABS Take 1 tablet by mouth daily.    . Omega-3 Fatty Acids  (FISH OIL PO) Take by mouth.    . Red Yeast Rice Extract 600 MG CAPS Take 1 capsule by mouth 2 (two) times daily.    . tacrolimus (PROTOPIC) 0.1 % ointment Apply to affected skin twice daily as directed. 100 g 3  . augmented betamethasone dipropionate (DIPROLENE-AF) 0.05 % ointment Apply to affected area as needed.    . betamethasone valerate (VALISONE) 0.1 % cream Apply topically 2 (two) times daily.    Marland Kitchen Coal Tar Extract 10 % OINT Apply 1 g topically 2 (two) times daily. (Patient not taking: Reported on 02/15/2016) 30 g 0  . mometasone (ELOCON) 0.1 % ointment Apply as directed to affected areas twice daily (Patient not taking: Reported on 02/15/2016) 90 g 5  . pimecrolimus (ELIDEL) 1 % cream Apply topically 2 (two) times daily. (Patient not taking: Reported on 02/15/2016) 30 g 0  . traMADol (ULTRAM) 50 MG tablet Take 0.5 tablets (25 mg total) by mouth every 6 (six) hours as needed. (Patient not taking: Reported on 02/15/2016) 8 tablet 0   No facility-administered medications prior to visit.         Objective:   Physical Exam Vitals:   02/15/16 1627  BP: 116/80  Pulse: 73  Weight: 226 lb 12.8 oz (102.9 kg)  Height: 5\' 9"  (1.753 m)   Gen: Pleasant, well-nourished, in no distress,  normal affect  ENT: No lesions,  mouth clear,  oropharynx clear, no postnasal drip  Neck: No JVD, no TMG, no carotid bruits  Lungs: No use of accessory muscles, no wheeze, clear without rales or rhonchi  Cardiovascular: RRR, heart sounds normal, no murmur or gallops, no peripheral edema  Musculoskeletal: No deformities, no cyanosis or clubbing  Neuro: alert, non focal  Skin: Warm, no lesions or rash    12/24/15 --  COMPARISON:  November 24, 2012  FINDINGS: There is scarring bases. There is increased opacity in the left base compared to most recent study, concerning for focal pneumonia superimposed on scarring. Lungs elsewhere are clear. Heart size and pulmonary vascularity are normal. No  adenopathy. No bone lesions.  IMPRESSION: Bibasilar scarring. Suspect pneumonia superimposed on scarring in the left base. Lungs elsewhere clear. Stable cardiac silhouette.  Followup PA and lateral chest radiographs recommended in 3-4 weeks following trial of antibiotic therapy to ensure resolution and exclude underlying malignancy     Assessment & Plan:  Asthma Stopped at approximately age 76 and a patient with a history of tobacco use. I suspect that this is primarily COPD. He had a recent flare with severe symptoms in the setting of a URI and possible to be acquired pneumonia. He is improved now. I like to arrange for pulmonary function testing to quantify his obstruction. Based on the results  we may decide to change his Advair to an alternative. We discussed the proper way to uses albuterol either by neb or by [facility]. We will repeat his CXR to insure clearance of his recent PNA.   We will perform pulmonary function testing at your next office visit We will repeat your CXR at your next visit here.  We will continue Advair 250/50, 2 puffs twice a day Take albuterol 2 puffs up to every 4 hours if needed for shortness of breath.  You may use albuterol nebulized up to every 4 hours if needed.  Follow with Dr Lamonte Sakai next available with PFT on same day.     Baltazar Apo, MD, PhD 02/15/2016, 5:11 PM [facility] [phone] or if no answer [phone]

## 2016-02-15 NOTE — Patient Instructions (Addendum)
We will perform pulmonary function testing at your next office visit We will repeat your CXR at your next visit here.  We will continue Advair 250/50, 2 puffs twice a day Take albuterol 2 puffs up to every 4 hours if needed for shortness of breath.  You may use albuterol nebulized up to every 4 hours if needed.  Follow with Dr Lamonte Sakai next available with PFT on same day.

## 2016-02-18 ENCOUNTER — Ambulatory Visit: Payer: Medicare Other | Admitting: Allergy

## 2016-02-27 ENCOUNTER — Telehealth: Payer: Self-pay | Admitting: *Deleted

## 2016-02-27 NOTE — Telephone Encounter (Signed)
Patient's wife informed. Will start referral process.

## 2016-02-27 NOTE — Telephone Encounter (Signed)
Patient wife called and advised patient's hand worse now on his butt?  She advised that since they cannot afford Dupixent what can they do to get this under control. Advised this needs be gotten under control now and needs to know if there is anything you can do or refer him to someone to someone that can deal with this ASAP.

## 2016-02-27 NOTE — Telephone Encounter (Signed)
Please refer him to [facility] dermatology for a ASAP appointment for severe hand dermatitis

## 2016-02-28 NOTE — Telephone Encounter (Signed)
Faxed information to [facility] per receptionist's request.  Called patient's wife to inform her and let her know to call [facility] to schedule husband's appointment.

## 2016-02-29 ENCOUNTER — Telehealth: Payer: Self-pay | Admitting: Emergency Medicine

## 2016-02-29 MED ORDER — ALBUTEROL SULFATE (2.5 MG/3ML) 0.083% IN NEBU: 2.5000 mg | INHALATION_SOLUTION | Freq: Four times a day (QID) | RESPIRATORY_TRACT | 6 refills | Status: DC | PRN

## 2016-02-29 NOTE — Telephone Encounter (Signed)
Received document stating that the nebulizer meds need to be filed under medicare part B.  New rx has been sent to the pharmacy and they will call back if anything further is needed.

## 2016-03-01 DIAGNOSIS — M25521 Pain in right elbow: Secondary | ICD-10-CM | POA: Diagnosis not present

## 2016-03-01 DIAGNOSIS — S56812A Strain of other muscles, fascia and tendons at forearm level, left arm, initial encounter: Secondary | ICD-10-CM | POA: Diagnosis not present

## 2016-03-05 DIAGNOSIS — Z79899 Other long term (current) drug therapy: Secondary | ICD-10-CM | POA: Diagnosis not present

## 2016-03-05 DIAGNOSIS — Z1159 Encounter for screening for other viral diseases: Secondary | ICD-10-CM | POA: Diagnosis not present

## 2016-03-05 DIAGNOSIS — L308 Other specified dermatitis: Secondary | ICD-10-CM | POA: Diagnosis not present

## 2016-03-08 DIAGNOSIS — M25521 Pain in right elbow: Secondary | ICD-10-CM | POA: Diagnosis not present

## 2016-03-08 DIAGNOSIS — S56811A Strain of other muscles, fascia and tendons at forearm level, right arm, initial encounter: Secondary | ICD-10-CM | POA: Diagnosis not present

## 2016-03-11 ENCOUNTER — Ambulatory Visit: Payer: Medicare Other | Admitting: Allergy and Immunology

## 2016-03-17 DIAGNOSIS — S46211D Strain of muscle, fascia and tendon of other parts of biceps, right arm, subsequent encounter: Secondary | ICD-10-CM | POA: Diagnosis not present

## 2016-03-17 DIAGNOSIS — M25521 Pain in right elbow: Secondary | ICD-10-CM | POA: Diagnosis not present

## 2016-04-10 ENCOUNTER — Ambulatory Visit (INDEPENDENT_AMBULATORY_CARE_PROVIDER_SITE_OTHER): Payer: Medicare Other | Admitting: Emergency Medicine

## 2016-04-10 ENCOUNTER — Encounter: Payer: Self-pay | Admitting: Emergency Medicine

## 2016-04-10 ENCOUNTER — Ambulatory Visit: Payer: Medicare Other | Admitting: Emergency Medicine

## 2016-04-10 DIAGNOSIS — J45909 Unspecified asthma, uncomplicated: Secondary | ICD-10-CM

## 2016-04-10 DIAGNOSIS — J301 Allergic rhinitis due to pollen: Secondary | ICD-10-CM

## 2016-04-10 DIAGNOSIS — J449 Chronic obstructive pulmonary disease, unspecified: Secondary | ICD-10-CM | POA: Diagnosis not present

## 2016-04-10 LAB — PULMONARY FUNCTION TEST
DL/VA % pred: 95 %
DL/VA: 4.41 ml/min/mmHg/L
DLCO cor % pred: 85 %
DLCO cor: 27.54 ml/min/mmHg
DLCO unc % pred: 87 %
DLCO unc: 28.36 ml/min/mmHg
FEF 25-75 Post: 3.09 L/sec
FEF 25-75 Pre: 2.87 L/sec
FEF2575-%Change-Post: 7 %
FEF2575-%Pred-Post: 121 %
FEF2575-%Pred-Pre: 112 %
FEV1-%Change-Post: 2 %
FEV1-%Pred-Post: 96 %
FEV1-%Pred-Pre: 94 %
FEV1-Post: 3.18 L
FEV1-Pre: 3.11 L
FEV1FVC-%Change-Post: 1 %
FEV1FVC-%Pred-Pre: 105 %
FEV6-%Change-Post: 1 %
FEV6-%Pred-Post: 94 %
FEV6-%Pred-Pre: 93 %
FEV6-Post: 3.99 L
FEV6-Pre: 3.94 L
FEV6FVC-%Change-Post: 0 %
FEV6FVC-%Pred-Post: 106 %
FEV6FVC-%Pred-Pre: 105 %
FVC-%Change-Post: 0 %
FVC-%Pred-Post: 89 %
FVC-%Pred-Pre: 88 %
FVC-Post: 3.99 L
FVC-Pre: 3.98 L
Post FEV1/FVC ratio: 80 %
Post FEV6/FVC ratio: 100 %
Pre FEV1/FVC ratio: 78 %
Pre FEV6/FVC Ratio: 100 %
RV % pred: 99 %
RV: 2.41 L
TLC % pred: 93 %
TLC: 6.54 L

## 2016-04-10 MED ORDER — FLUTICASONE PROPIONATE 50 MCG/ACT NA SUSP: 2.0000 | Freq: Every day | NASAL | 5 refills | Status: DC

## 2016-04-10 MED ORDER — LORATADINE 10 MG PO TABS: 10.0000 mg | ORAL_TABLET | Freq: Every day | ORAL | 5 refills | Status: DC

## 2016-04-10 NOTE — Assessment & Plan Note (Signed)
Grossly normal spirometry with only some mild curb his flow-volume loop (on Advair). Discussed possibly stopping his Advair to see if he would tolerate.

## 2016-04-10 NOTE — Progress Notes (Signed)
PFT done today. 

## 2016-04-10 NOTE — Assessment & Plan Note (Signed)
Untreated. Suspect that we could significantly decrease sx if we treat > start loratadine and fluticasone NS. Hope that treating will allow him to tolerate stopping Advair.

## 2016-04-10 NOTE — Progress Notes (Signed)
Subjective:    Patient ID: Nathan Ramirez, male    DOB: 1945/11/23, 71 y.o.   MRN: [id number]  HPI 71 year old man with a history of former tobacco use ( 20 pk-yrs), second hand smoke a a child, allergies formerly on imunotherapy, GERD, asthma dx about 18 yrs ago, has been on Advair for years. He has also been treated with Spiriva in the past, not currently. He had a flare in Dec '17, following a URI, required rx with pred + abx. He averages an AE about 4x a year. He has been having trouble with dermatitis on hands, has started seeing allergist lately. He also has GERD every day, is on lansoprazole every day.  He is not limited by breathing unless exacerbated. No cough, congestion, wheeze unless flaring. Other triggers include smoke, sometimes dust.   71 04/10/16 -- follow up for obstructive lung disease, hx COPD +/- asthma. He underwent pulmonary function testing today that I reviewed, show grossly normal airflows without a BD response (on Advair). Normal volumes and diffusion. Some possible subtle curve of the F/V loop. He does have some environmental allergies. He uses albuterol very rarely.    Review of Systems  Constitutional: Negative.  Negative for fever and unexpected weight change.  HENT: Negative for congestion, dental problem, ear pain, nosebleeds, postnasal drip, rhinorrhea, sinus pressure, sneezing, sore throat and trouble swallowing.   Eyes: Negative.  Negative for redness and itching.  Respiratory: Positive for cough, shortness of breath and wheezing. Negative for chest tightness.   Cardiovascular: Negative.  Negative for palpitations and leg swelling.  Gastrointestinal: Negative.  Negative for nausea and vomiting.  Genitourinary: Negative for dysuria.  Musculoskeletal: Negative.  Negative for joint swelling.  Skin: Negative.  Negative for rash.  Neurological: Negative for headaches.  Hematological: Negative.  Does not bruise/bleed easily.  Psychiatric/Behavioral: Negative.   Negative for dysphoric mood. The patient is not nervous/anxious.     Past Medical History:  Diagnosis Date  . Allergy   . Arthritis   . Asthma    "allergic"  . Carcinoma, basal cell, skin 2015   thigh  . Chicken pox   . Colon polyp 2011?   pathology unknown  . Elevated blood pressure reading   . GERD (gastroesophageal reflux disease)    never had EGD  . Headache(784.0)    occasional tension  . Hypercholesteremia    borderline  . PONV (postoperative nausea and vomiting)      Family History  Problem Relation Age of Onset  . COPD Mother   . Lung cancer Father   . Prostate cancer Father   . Diabetes Sister      Social History   Social History  . Marital status: Married    Spouse name: N/A  . Number of children: N/A  . Years of education: N/A   Occupational History  . Not on file.   Social History Main Topics  . Smoking status: Former Smoker    Packs/day: 1.00    Years: 40.00    Types: Cigarettes    Quit date: 01/14/1988  . Smokeless tobacco: Former Systems developer  . Alcohol use 0.0 oz/week     Comment: OCCASSIONALLY  . Drug use: No  . Sexual activity: Not on file   Other Topics Concern  . Not on file   Social History Narrative  . No narrative on file  works as a Dealer / maintenance of heavy machinery.  Was [employer] [place], no known inhaled exposures there.  [place] native.   Allergies  Allergen Reactions  . Tamiflu [Oseltamivir Phosphate] Nausea And Vomiting and Other (See Comments)    Severe Headache  . Acitretin Rash     Outpatient Medications Prior to Visit  Medication Sig Dispense Refill  . ADVAIR DISKUS 250-50 MCG/DOSE AEPB INHALE ONE DOSE BY MOUTH TWICE DAILY 60 each 3  . albuterol (PROVENTIL HFA;VENTOLIN HFA) 108 (90 Base) MCG/ACT inhaler Inhale 2 puffs into the lungs every 6 (six) hours as needed for wheezing or shortness of breath. 1 Inhaler 1  . aspirin 81 MG tablet Take 81 mg by mouth daily.    . Cholecalciferol (VITAMIN D PO) Take by mouth daily.     . Multiple Vitamins-Minerals (MULTI FOR HIM 50+) TABS Take 1 tablet by mouth daily.    . Omega-3 Fatty Acids (FISH OIL PO) Take by mouth.    . Red Yeast Rice Extract 600 MG CAPS Take 1 capsule by mouth 2 (two) times daily.    Marland Kitchen albuterol (PROVENTIL) (2.5 MG/3ML) 0.083% nebulizer solution Take 3 mLs (2.5 mg total) by nebulization every 6 (six) hours as needed for wheezing or shortness of breath. 150 mL 6  . augmented betamethasone dipropionate (DIPROLENE-AF) 0.05 % ointment Apply to affected area as needed.    . betamethasone valerate (VALISONE) 0.1 % cream Apply topically 2 (two) times daily.    Marland Kitchen Coal Tar Extract 10 % OINT Apply 1 g topically 2 (two) times daily. (Patient not taking: Reported on 02/15/2016) 30 g 0  . IBUPROFEN PO Take by mouth 2 (two) times daily.    . mometasone (ELOCON) 0.1 % ointment Apply as directed to affected areas twice daily (Patient not taking: Reported on 02/15/2016) 90 g 5  . pimecrolimus (ELIDEL) 1 % cream Apply topically 2 (two) times daily. (Patient not taking: Reported on 02/15/2016) 30 g 0  . tacrolimus (PROTOPIC) 0.1 % ointment Apply to affected skin twice daily as directed. 100 g 3  . traMADol (ULTRAM) 50 MG tablet Take 0.5 tablets (25 mg total) by mouth every 6 (six) hours as needed. (Patient not taking: Reported on 02/15/2016) 8 tablet 0   No facility-administered medications prior to visit.         Objective:   Physical Exam Vitals:   04/10/16 1345  BP: 120/76  Pulse: 66  SpO2: 97%  Weight: 226 lb (102.5 kg)  Height: 5\' 9"  (1.753 m)   Gen: Pleasant, well-nourished, in no distress,  normal affect  ENT: No lesions,  mouth clear,  oropharynx clear, no postnasal drip  Neck: No JVD, no TMG, no carotid bruits  Lungs: No use of accessory muscles, no wheeze, clear without rales or rhonchi  Cardiovascular: RRR, heart sounds normal, no murmur or gallops, no peripheral edema  Musculoskeletal: No deformities, no cyanosis or clubbing  Neuro: alert, non  focal  Skin: Warm, no lesions or rash    12/24/15 --  COMPARISON:  November 24, 2012  FINDINGS: There is scarring bases. There is increased opacity in the left base compared to most recent study, concerning for focal pneumonia superimposed on scarring. Lungs elsewhere are clear. Heart size and pulmonary vascularity are normal. No adenopathy. No bone lesions.  IMPRESSION: Bibasilar scarring. Suspect pneumonia superimposed on scarring in the left base. Lungs elsewhere clear. Stable cardiac silhouette.  Followup PA and lateral chest radiographs recommended in 3-4 weeks following trial of antibiotic therapy to ensure resolution and exclude underlying malignancy     Assessment & Plan:  Asthma Grossly normal  spirometry with only some mild curb his flow-volume loop (on Advair). Discussed possibly stopping his Advair to see if he would tolerate.  Allergic rhinitis Untreated. Suspect that we could significantly decrease sx if we treat > start loratadine and fluticasone NS. Hope that treating will allow him to tolerate stopping Advair.    Baltazar Apo, MD, PhD 04/10/2016, 2:17 PM [facility] [phone] or if no answer [phone]

## 2016-04-10 NOTE — Patient Instructions (Addendum)
We could try stopping Advair to see if you miss it.  Keep albuterol available to use 2 puffs up to every 4 hours if needed for shortness of breath.  Start loratadine 10mg  once a day  Start fluticasone nasal spray, 2 sprays each nostril once a day.  Follow with Dr Lamonte Sakai in 6 months or sooner if you have any problems

## 2016-04-16 DIAGNOSIS — L409 Psoriasis, unspecified: Secondary | ICD-10-CM | POA: Diagnosis not present

## 2016-04-16 DIAGNOSIS — Z79899 Other long term (current) drug therapy: Secondary | ICD-10-CM | POA: Diagnosis not present

## 2016-04-16 DIAGNOSIS — L309 Dermatitis, unspecified: Secondary | ICD-10-CM | POA: Diagnosis not present

## 2016-06-02 DIAGNOSIS — L309 Dermatitis, unspecified: Secondary | ICD-10-CM | POA: Diagnosis not present

## 2016-06-02 DIAGNOSIS — L400 Psoriasis vulgaris: Secondary | ICD-10-CM | POA: Diagnosis not present

## 2016-06-02 DIAGNOSIS — L409 Psoriasis, unspecified: Secondary | ICD-10-CM | POA: Diagnosis not present

## 2016-06-06 ENCOUNTER — Telehealth: Payer: Self-pay | Admitting: Internal Medicine

## 2016-06-06 NOTE — Telephone Encounter (Signed)
Spoke to pt wife. Pt is at work and will have him call me back to schedule AWV.

## 2016-06-10 ENCOUNTER — Telehealth: Payer: Self-pay | Admitting: Emergency Medicine

## 2016-06-10 MED ORDER — ALBUTEROL SULFATE HFA 108 (90 BASE) MCG/ACT IN AERS: 2.0000 | INHALATION_SPRAY | Freq: Four times a day (QID) | RESPIRATORY_TRACT | 5 refills | Status: DC | PRN

## 2016-06-10 NOTE — Telephone Encounter (Signed)
Opened in error

## 2016-06-10 NOTE — Telephone Encounter (Signed)
lmtcb X1 for pt  

## 2016-06-10 NOTE — Telephone Encounter (Signed)
Patient's wife is calling back, [phone].  States at an appointment now and please call back around 11:00 am or after.

## 2016-06-10 NOTE — Telephone Encounter (Signed)
Spoke with pt and wife, states that pt was short of breath this weekend and had run out of albuterol inhaler- is requesting a refill.  This has been sent to preferred pharmacy.  Nothing further needed.

## 2016-06-13 ENCOUNTER — Telehealth: Payer: Self-pay | Admitting: Emergency Medicine

## 2016-06-13 NOTE — Telephone Encounter (Signed)
Please give pred taper as below. If no better next week then he needs to be seen  Pred: Take 40mg  daily for 3 days, then 30mg  daily for 3 days, then 20mg  daily for 3 days, then 10mg  daily for 3 days, then stop

## 2016-06-13 NOTE — Telephone Encounter (Signed)
Spoke with wife, Rosann Auerbach, who states c/o reports of prod cough with clear mucus, wheezing, increased sob, chest tightness & increased fatigued x3d Pt using ventolin 2-3x daily with mild improvement. Pt taken mucinex with no improvement. Pt denies any fever, chills or sweats.   RB please advise.

## 2016-06-13 NOTE — Telephone Encounter (Signed)
lmtcb x1 for pt. 

## 2016-06-16 MED ORDER — PREDNISONE 10 MG PO TABS: ORAL_TABLET | ORAL | 0 refills | Status: DC

## 2016-06-16 NOTE — Telephone Encounter (Signed)
Called and spoke to pt. Informed him of the recs per RB. Rx sent to preferred pharmacy. Pt verbalized understanding and denied any further questions or concerns at this time.   

## 2016-06-16 NOTE — Telephone Encounter (Signed)
Wife returned phone call...ert

## 2016-07-11 ENCOUNTER — Telehealth: Payer: Self-pay | Admitting: Emergency Medicine

## 2016-07-11 MED ORDER — PREDNISONE 10 MG PO TABS: ORAL_TABLET | ORAL | 0 refills | Status: DC

## 2016-07-11 NOTE — Telephone Encounter (Signed)
Spoke with pt's wife, aware of recs.  Prednisone sent to preferred pharmacy.  Nothing further needed.

## 2016-07-11 NOTE — Telephone Encounter (Signed)
Please have him start taking symptomatically relief such as Tylenol Cold and flu, TheraFlu. Also please give him a prednisone taper as follows > Take 40mg  daily for 3 days, then 30mg  daily for 3 days, then 20mg  daily for 3 days, then 10mg  daily for 3 days, then stop

## 2016-07-11 NOTE — Telephone Encounter (Signed)
Spoke with pt's wife, states that pt was exposed to someone with a cold, is now experiencing increased SOB, wheezing, nonprod cough X4 days.    Pt taking mucinex D, loratadine, flonase, as well as inhalers.  Requesting further recs.    Pt uses [facility] in [place].    RB please advise on further recs.  Thanks.

## 2016-07-17 NOTE — Telephone Encounter (Signed)
Pt declined AWV. °

## 2016-07-21 ENCOUNTER — Ambulatory Visit: Admission: RE | Admit: 2016-07-21 | Discharge: 2016-07-21 | Payer: MEDICARE

## 2016-07-21 DIAGNOSIS — L309 Dermatitis, unspecified: Secondary | ICD-10-CM | POA: Diagnosis not present

## 2016-07-21 MED ORDER — UREA 40 % TOPICAL CREAM
Freq: Two times a day (BID) | TOPICAL | 11 refills | 0.00000 days | Status: CP
Start: 2016-07-21 — End: 2017-04-15

## 2016-07-21 MED ORDER — DISPOSABLE GLOVES
3 refills | 0.00000 days | Status: CP
Start: 2016-07-21 — End: 2017-04-15

## 2016-07-21 MED ORDER — CLOBETASOL 0.05 % TOPICAL OINTMENT
OPHTHALMIC | 3 refills | 0.00000 days | Status: CP
Start: 2016-07-21 — End: 2016-08-09

## 2016-07-21 MED ORDER — FOLIC ACID 1 MG TABLET
ORAL_TABLET | Freq: Every day | ORAL | 3 refills | 0.00000 days | Status: CP
Start: 2016-07-21 — End: 2017-01-14

## 2016-07-21 MED ORDER — METHOTREXATE SODIUM 2.5 MG TABLET
ORAL_TABLET | ORAL | 2 refills | 0.00000 days | Status: CP
Start: 2016-07-21 — End: 2016-08-20

## 2016-07-22 MED ORDER — AMMONIUM LACTATE 12 % TOPICAL CREAM
Freq: Two times a day (BID) | TOPICAL | 2 refills | 0.00000 days | Status: CP
Start: 2016-07-22 — End: 2017-04-15

## 2016-08-08 MED ORDER — TRIAMCINOLONE ACETONIDE 0.1 % TOPICAL OINTMENT
INTRAMUSCULAR | 1 refills | 0.00000 days | Status: CP
Start: 2016-08-08 — End: 2017-04-15

## 2016-08-09 MED ORDER — CLOBETASOL 0.05 % TOPICAL OINTMENT
OPHTHALMIC | 3 refills | 0.00000 days | Status: CP
Start: 2016-08-09 — End: 2016-12-10

## 2016-08-11 ENCOUNTER — Ambulatory Visit: Admission: RE | Admit: 2016-08-11 | Discharge: 2016-08-11 | Payer: MEDICARE

## 2016-08-11 ENCOUNTER — Ambulatory Visit
Admission: RE | Admit: 2016-08-11 | Discharge: 2016-08-11 | Disposition: A | Discharge status: Home / Self Care | Payer: MEDICARE

## 2016-08-11 DIAGNOSIS — L308 Other specified dermatitis: Secondary | ICD-10-CM | POA: Diagnosis not present

## 2016-08-11 DIAGNOSIS — R21 Rash and other nonspecific skin eruption: Secondary | ICD-10-CM | POA: Diagnosis not present

## 2016-08-11 DIAGNOSIS — D489 Neoplasm of uncertain behavior, unspecified: Secondary | ICD-10-CM | POA: Diagnosis not present

## 2016-08-11 MED ORDER — PREDNISONE 10 MG TABLET
ORAL_TABLET | ORAL | 0 refills | 0.00000 days | Status: CP
Start: 2016-08-11 — End: 2016-08-20

## 2016-08-15 ENCOUNTER — Ambulatory Visit: Admit: 2016-08-15 | Discharge: 2016-08-15 | Disposition: A | Discharge status: Home / Self Care | Payer: MEDICARE

## 2016-08-15 DIAGNOSIS — D489 Neoplasm of uncertain behavior, unspecified: Secondary | ICD-10-CM | POA: Diagnosis not present

## 2016-08-15 DIAGNOSIS — L308 Other specified dermatitis: Secondary | ICD-10-CM | POA: Diagnosis not present

## 2016-08-15 DIAGNOSIS — D485 Neoplasm of uncertain behavior of skin: Principal | ICD-10-CM

## 2016-08-20 ENCOUNTER — Ambulatory Visit: Admission: RE | Admit: 2016-08-20 | Discharge: 2016-08-20 | Payer: MEDICARE

## 2016-08-20 DIAGNOSIS — R21 Rash and other nonspecific skin eruption: Secondary | ICD-10-CM | POA: Diagnosis not present

## 2016-08-20 MED ORDER — PREDNISONE 10 MG TABLET
ORAL_TABLET | Freq: Every day | ORAL | 0 refills | 0.00000 days | Status: CP
Start: 2016-08-20 — End: 2016-08-22

## 2016-08-22 MED ORDER — PREDNISONE 10 MG TABLET
ORAL_TABLET | Freq: Every day | ORAL | 0 refills | 0.00000 days | Status: CP
Start: 2016-08-22 — End: 2016-09-21

## 2016-09-22 DIAGNOSIS — Z23 Encounter for immunization: Secondary | ICD-10-CM | POA: Diagnosis not present

## 2016-09-22 DIAGNOSIS — D485 Neoplasm of uncertain behavior of skin: Secondary | ICD-10-CM | POA: Diagnosis not present

## 2016-09-22 DIAGNOSIS — D2361 Other benign neoplasm of skin of right upper limb, including shoulder: Secondary | ICD-10-CM | POA: Diagnosis not present

## 2016-09-22 DIAGNOSIS — L309 Dermatitis, unspecified: Secondary | ICD-10-CM | POA: Diagnosis not present

## 2016-09-22 DIAGNOSIS — D1801 Hemangioma of skin and subcutaneous tissue: Secondary | ICD-10-CM | POA: Diagnosis not present

## 2016-09-22 DIAGNOSIS — L821 Other seborrheic keratosis: Secondary | ICD-10-CM | POA: Diagnosis not present

## 2016-09-22 DIAGNOSIS — L814 Other melanin hyperpigmentation: Secondary | ICD-10-CM | POA: Diagnosis not present

## 2016-09-22 DIAGNOSIS — Z86018 Personal history of other benign neoplasm: Secondary | ICD-10-CM | POA: Diagnosis not present

## 2016-09-22 DIAGNOSIS — D225 Melanocytic nevi of trunk: Secondary | ICD-10-CM | POA: Diagnosis not present

## 2016-09-25 ENCOUNTER — Other Ambulatory Visit: Payer: Self-pay | Admitting: Emergency Medicine

## 2016-09-26 DIAGNOSIS — Z23 Encounter for immunization: Secondary | ICD-10-CM | POA: Diagnosis not present

## 2016-10-07 ENCOUNTER — Encounter: Payer: Self-pay | Admitting: Emergency Medicine

## 2016-10-07 ENCOUNTER — Ambulatory Visit (INDEPENDENT_AMBULATORY_CARE_PROVIDER_SITE_OTHER)
Admission: RE | Admit: 2016-10-07 | Discharge: 2016-10-07 | Disposition: A | Discharge status: Home / Self Care | Payer: Medicare Other | Source: Ambulatory Visit | Attending: Emergency Medicine | Admitting: Emergency Medicine

## 2016-10-07 ENCOUNTER — Ambulatory Visit (INDEPENDENT_AMBULATORY_CARE_PROVIDER_SITE_OTHER): Payer: Medicare Other | Admitting: Emergency Medicine

## 2016-10-07 VITALS — BP 120/70 | HR 76 | Ht 70.0 in | Wt 234.0 lb

## 2016-10-07 DIAGNOSIS — Z5181 Encounter for therapeutic drug level monitoring: Secondary | ICD-10-CM | POA: Diagnosis not present

## 2016-10-07 DIAGNOSIS — R918 Other nonspecific abnormal finding of lung field: Secondary | ICD-10-CM | POA: Diagnosis not present

## 2016-10-07 NOTE — Patient Instructions (Signed)
Chest x-ray today for monitoring on methotrexate Please keep albuterol available to use 2 puffs up to every 4 hours if needed for shortness of breath or chest tightness. Continue loratadine 10 mg every day Continue fluticasone nasal spray, 2 sprays each nostril once a day Flu shot is up-to-date Follow with Dr Lamonte Sakai in 6 months or sooner if you have any problems

## 2016-10-07 NOTE — Assessment & Plan Note (Signed)
Continue loratadine, fluticasone nasal spray. As mentioned he is benefiting from the prednisone for his psoriasis

## 2016-10-07 NOTE — Assessment & Plan Note (Signed)
Very mild obstruction suggested by the curve his flow-volume loop. Otherwise the PFT are normal. I believe we can continue off of a scheduled bronchodilator. He will use albuterol as needed. Any good allergy control, he is currently benefiting from prednisone that is treating his psoriasis

## 2016-10-07 NOTE — Progress Notes (Signed)
Subjective:    Patient ID: Nathan Ramirez, male    DOB: 1945-02-24, 71 y.o.   MRN: [id number]  HPI 71 year old man with a history of former tobacco use ( 20 pk-yrs), second hand smoke a a child, allergies formerly on imunotherapy, GERD, asthma dx about 18 yrs ago, has been on Advair for years. He has also been treated with Spiriva in the past, not currently. He had a flare in Dec '17, following a URI, required rx with pred + abx. He averages an AE about 4x a year. He has been having trouble with dermatitis on hands, has started seeing allergist lately. He also has GERD every day, is on lansoprazole every day.  He is not limited by breathing unless exacerbated. No cough, congestion, wheeze unless flaring. Other triggers include smoke, sometimes dust.   ROV 04/10/16 -- follow up for obstructive lung disease, hx COPD +/- asthma. He underwent pulmonary function testing today that I reviewed, show grossly normal airflows without a BD response (on Advair). Normal volumes and diffusion. Some possible subtle curve of the F/V loop. He does have some environmental allergies. He uses albuterol very rarely.   ROV 10/07/16 -- This is a follow-up visit for psoriasis, allergic rhinitis,  COPD and suspected clinical asthma. His pulmonary function testing has been reassuring but with some possible curve to his flow-volume loop.  He has been on prednisone 30mg  for the last 3 months for psoriasis in addition to his MTX. We stopped Advair last time to see how he would tolerate. He is using SABA at least once a day. He is exposed to some sort of solvent at work that causes chest tightness. He did start loratadine and flonase as planned - seem to be helping.    Review of Systems  Constitutional: Negative.  Negative for fever and unexpected weight change.  HENT: Negative for congestion, dental problem, ear pain, nosebleeds, postnasal drip, rhinorrhea, sinus pressure, sneezing, sore throat and trouble swallowing.   Eyes:  Negative.  Negative for redness and itching.  Respiratory: Positive for cough, shortness of breath and wheezing. Negative for chest tightness.   Cardiovascular: Negative.  Negative for palpitations and leg swelling.  Gastrointestinal: Negative.  Negative for nausea and vomiting.  Genitourinary: Negative for dysuria.  Musculoskeletal: Negative.  Negative for joint swelling.  Skin: Negative.  Negative for rash.  Neurological: Negative for headaches.  Hematological: Negative.  Does not bruise/bleed easily.  Psychiatric/Behavioral: Negative.  Negative for dysphoric mood. The patient is not nervous/anxious.     Past Medical History:  Diagnosis Date  . Allergy   . Arthritis   . Asthma    "allergic"  . Carcinoma, basal cell, skin 2015   thigh  . Chicken pox   . Colon polyp 2011?   pathology unknown  . Elevated blood pressure reading   . GERD (gastroesophageal reflux disease)    never had EGD  . Headache(784.0)    occasional tension  . Hypercholesteremia    borderline  . PONV (postoperative nausea and vomiting)      Family History  Problem Relation Age of Onset  . COPD Mother   . Lung cancer Father   . Prostate cancer Father   . Diabetes Sister      Social History   Social History  . Marital status: Married    Spouse name: N/A  . Number of children: N/A  . Years of education: N/A   Occupational History  . Not on file.   Social History  Main Topics  . Smoking status: Former Smoker    Packs/day: 1.00    Years: 40.00    Types: Cigarettes    Quit date: 01/14/1988  . Smokeless tobacco: Former Systems developer  . Alcohol use 0.0 oz/week     Comment: OCCASSIONALLY  . Drug use: No  . Sexual activity: Not on file   Other Topics Concern  . Not on file   Social History Narrative  . No narrative on file  works as a Dealer / maintenance of heavy machinery.  Was [employer] [place], no known inhaled exposures there.  [place].   Allergies  Allergen Reactions  . Tamiflu  [Oseltamivir Phosphate] Nausea And Vomiting and Other (See Comments)    Severe Headache  . Acitretin Rash     Outpatient Medications Prior to Visit  Medication Sig Dispense Refill  . ADVAIR DISKUS 250-50 MCG/DOSE AEPB INHALE ONE DOSE BY MOUTH TWICE DAILY 60 each 3  . albuterol (PROVENTIL HFA;VENTOLIN HFA) 108 (90 Base) MCG/ACT inhaler Inhale 2 puffs into the lungs every 6 (six) hours as needed for wheezing or shortness of breath. 1 Inhaler 5  . aspirin 81 MG tablet Take 81 mg by mouth daily.    . Cholecalciferol (VITAMIN D PO) Take by mouth daily.    . fluticasone (FLONASE) 50 MCG/ACT nasal spray Place 2 sprays into both nostrils daily. 16 g 5  . loratadine (CLARITIN) 10 MG tablet TAKE 1 TABLET (10 MG TOTAL) BY MOUTH DAILY. 30 tablet 5  . Multiple Vitamins-Minerals (MULTI FOR HIM 50+) TABS Take 1 tablet by mouth daily.    . Omega-3 Fatty Acids (FISH OIL PO) Take by mouth.    . Red Yeast Rice Extract 600 MG CAPS Take 1 capsule by mouth 2 (two) times daily.    . predniSONE (DELTASONE) 10 MG tablet 4 tabs x 3 days, 3 tabs x 3 days, 2 tabs x 3 days, 1 tab x 3 days then stop 30 tablet 0  . predniSONE (DELTASONE) 10 MG tablet 40mg X3 days, 30mg  X3 days, 20mg  X3 days, 10mg X3 days, then stop. 30 tablet 0   No facility-administered medications prior to visit.         Objective:   Physical Exam Vitals:   10/07/16 1000  BP: 120/70  Pulse: 76  SpO2: 94%  Weight: 234 lb (106.1 kg)  Height: 5\' 10"  (1.778 m)   Gen: Pleasant, well-nourished, in no distress,  normal affect  ENT: No lesions,  mouth clear,  oropharynx clear, no postnasal drip  Neck: No JVD, no TMG, no carotid bruits  Lungs: No use of accessory muscles, no wheeze, clear without rales or rhonchi  Cardiovascular: RRR, heart sounds normal, no murmur or gallops, no peripheral edema  Musculoskeletal: No deformities, no cyanosis or clubbing  Neuro: alert, non focal  Skin: Warm, no lesions or rash    12/24/15 --  COMPARISON:   November 24, 2012  FINDINGS: There is scarring bases. There is increased opacity in the left base compared to most recent study, concerning for focal pneumonia superimposed on scarring. Lungs elsewhere are clear. Heart size and pulmonary vascularity are normal. No adenopathy. No bone lesions.  IMPRESSION: Bibasilar scarring. Suspect pneumonia superimposed on scarring in the left base. Lungs elsewhere clear. Stable cardiac silhouette.  Followup PA and lateral chest radiographs recommended in 3-4 weeks following trial of antibiotic therapy to ensure resolution and exclude underlying malignancy     Assessment & Plan:  Asthma Very mild obstruction suggested by the  curve his flow-volume loop. Otherwise the PFT are normal. I believe we can continue off of a scheduled bronchodilator. He will use albuterol as needed. Any good allergy control, he is currently benefiting from prednisone that is treating his psoriasis  Allergic rhinitis Continue loratadine, fluticasone nasal spray. As mentioned he is benefiting from the prednisone for his psoriasis  Plaque psoriasis With chronic methotrexate use and now on prednisone 30 g daily. I will check a chest x-ray given his methotrexate use.   Baltazar Apo, MD, PhD 10/07/2016, 10:31 AM [facility] [phone] or if no answer [phone]

## 2016-10-07 NOTE — Assessment & Plan Note (Signed)
With chronic methotrexate use and now on prednisone 30 g daily. I will check a chest x-ray given his methotrexate use.

## 2016-10-13 ENCOUNTER — Telehealth: Payer: Self-pay | Admitting: Emergency Medicine

## 2016-10-13 NOTE — Telephone Encounter (Signed)
Spoke with pt's wife, Rosann Auerbach Pt is requesting his CXR results.  RB - please advise. Thanks.

## 2016-10-14 NOTE — Telephone Encounter (Signed)
Pt's wife returned phone call.Marland Kitchen

## 2016-10-14 NOTE — Telephone Encounter (Signed)
lmtcb x1 for pt/pt's wife.

## 2016-10-14 NOTE — Telephone Encounter (Signed)
Spoke with pt's spouse, aware of results/recs.  Nothing further needed.  

## 2016-10-14 NOTE — Telephone Encounter (Signed)
Please let them know that his CXR is unchanged, no evidence to suggest any changes due to taking methotrexate. This is good news. Thanks.

## 2016-10-20 ENCOUNTER — Ambulatory Visit
Admission: RE | Admit: 2016-10-20 | Discharge: 2016-10-20 | Disposition: A | Discharge status: Home / Self Care | Payer: MEDICARE

## 2016-10-20 DIAGNOSIS — L309 Dermatitis, unspecified: Secondary | ICD-10-CM | POA: Diagnosis not present

## 2016-10-20 DIAGNOSIS — L301 Dyshidrosis [pompholyx]: Secondary | ICD-10-CM | POA: Diagnosis not present

## 2016-10-20 MED ORDER — MYCOPHENOLATE MOFETIL 200 MG/ML ORAL SUSPENSION
Freq: Two times a day (BID) | ORAL | 2 refills | 0.00000 days | Status: CP
Start: 2016-10-20 — End: 2016-10-21

## 2016-10-21 MED ORDER — MYCOPHENOLATE MOFETIL 500 MG TABLET
ORAL_TABLET | Freq: Two times a day (BID) | ORAL | 1 refills | 0.00000 days | Status: CP
Start: 2016-10-21 — End: 2016-10-23

## 2016-10-23 MED ORDER — MYCOPHENOLATE MOFETIL 500 MG TABLET
ORAL_TABLET | Freq: Two times a day (BID) | ORAL | 1 refills | 0.00000 days | Status: CP
Start: 2016-10-23 — End: 2016-11-12

## 2016-11-02 DIAGNOSIS — Z85820 Personal history of malignant melanoma of skin: Secondary | ICD-10-CM | POA: Diagnosis not present

## 2016-11-02 DIAGNOSIS — Z808 Family history of malignant neoplasm of other organs or systems: Secondary | ICD-10-CM | POA: Diagnosis not present

## 2016-11-02 DIAGNOSIS — Z801 Family history of malignant neoplasm of trachea, bronchus and lung: Secondary | ICD-10-CM | POA: Diagnosis not present

## 2016-11-12 ENCOUNTER — Ambulatory Visit
Admission: RE | Admit: 2016-11-12 | Discharge: 2016-11-12 | Disposition: A | Discharge status: Home / Self Care | Payer: MEDICARE

## 2016-11-12 DIAGNOSIS — Z79899 Other long term (current) drug therapy: Secondary | ICD-10-CM | POA: Diagnosis not present

## 2016-11-12 DIAGNOSIS — L309 Dermatitis, unspecified: Secondary | ICD-10-CM | POA: Diagnosis not present

## 2016-11-12 LAB — CBC AND DIFFERENTIAL
HCT: 45 (ref 41–53)
Hemoglobin: 15 (ref 13.5–17.5)
Platelets: 258 (ref 150–399)
WBC: 9.1

## 2016-11-12 LAB — BASIC METABOLIC PANEL
BUN: 15 (ref 4–21)
Creatinine: 0.9 (ref 0.6–1.3)
Sodium: 139 (ref 137–147)

## 2016-11-12 MED ORDER — PREDNISONE 10 MG TABLET
ORAL_TABLET | Freq: Every day | ORAL | 1 refills | 0.00000 days | Status: CP
Start: 2016-11-12 — End: 2016-12-10

## 2016-11-12 MED ORDER — MYCOPHENOLATE MOFETIL 500 MG TABLET
ORAL_TABLET | Freq: Two times a day (BID) | ORAL | 1 refills | 0.00000 days | Status: CP
Start: 2016-11-12 — End: 2016-12-10

## 2016-11-25 DIAGNOSIS — L723 Sebaceous cyst: Secondary | ICD-10-CM | POA: Diagnosis not present

## 2016-11-30 MED ORDER — MYCOPHENOLATE MOFETIL 500 MG TABLET
ORAL_TABLET | ORAL | 1 refills | 0.00000 days | Status: CP
Start: 2016-11-30 — End: 2016-12-10

## 2016-12-09 ENCOUNTER — Other Ambulatory Visit: Payer: Self-pay | Admitting: Emergency Medicine

## 2016-12-10 ENCOUNTER — Ambulatory Visit
Admission: RE | Admit: 2016-12-10 | Discharge: 2016-12-10 | Disposition: A | Discharge status: Home / Self Care | Payer: MEDICARE

## 2016-12-10 ENCOUNTER — Telehealth: Payer: Self-pay

## 2016-12-10 DIAGNOSIS — L851 Acquired keratosis [keratoderma] palmaris et plantaris: Secondary | ICD-10-CM | POA: Diagnosis not present

## 2016-12-10 DIAGNOSIS — L309 Dermatitis, unspecified: Secondary | ICD-10-CM | POA: Diagnosis not present

## 2016-12-10 DIAGNOSIS — Z79899 Other long term (current) drug therapy: Secondary | ICD-10-CM | POA: Diagnosis not present

## 2016-12-10 MED ORDER — MYCOPHENOLATE MOFETIL 500 MG TABLET
ORAL_TABLET | Freq: Two times a day (BID) | ORAL | 5 refills | 0.00000 days | Status: CP
Start: 2016-12-10 — End: 2017-01-14

## 2016-12-10 MED ORDER — CLOBETASOL 0.05 % TOPICAL OINTMENT
OPHTHALMIC | 3 refills | 0.00000 days | Status: CP
Start: 2016-12-10 — End: 2017-07-27

## 2016-12-10 MED ORDER — PREDNISONE 10 MG TABLET
ORAL_TABLET | Freq: Every day | ORAL | 1 refills | 0.00000 days | Status: CP
Start: 2016-12-10 — End: 2017-01-14

## 2016-12-10 NOTE — Telephone Encounter (Signed)
Urgent visit is not necessary ,  If he will review his last e mail message from me  He will notice that I told him the same thing,  January is fine.   He needs to exercise for 30 mintues daily and cut out starches and refined sugars from his det.

## 2016-12-10 NOTE — Telephone Encounter (Signed)
Please call and schedule patient

## 2016-12-10 NOTE — Telephone Encounter (Signed)
OK to use 4:30 slot next week?  Copied from [place] [phone]. Topic: Quick Communication - See Telephone Encounter >> Dec 10, 2016  9:44 AM Antonieta Iba C wrote: Pt's spouse Rosann Auerbach called in because pt is at a dermatology apt. She said that pt had a lab completed and was advised that he is possible pre-diabetic. They are having specialist to fax over results to PCP.  Pt is also advised to follow up with PCP as soon as possible. Not currently showing any openings w/PCP. Pt only want to see PCP and would like to know if there is any way possible that she could work him in sooner than next available (January)    Please advise.    Thanks.

## 2016-12-11 ENCOUNTER — Telehealth: Payer: Self-pay | Admitting: Internal Medicine

## 2016-12-11 DIAGNOSIS — R7303 Prediabetes: Secondary | ICD-10-CM

## 2016-12-11 DIAGNOSIS — E785 Hyperlipidemia, unspecified: Secondary | ICD-10-CM

## 2016-12-11 NOTE — Telephone Encounter (Signed)
Patient scheduled for 12/11.

## 2016-12-11 NOTE — Telephone Encounter (Signed)
Ok to do so? And are there any other labs you would like to have ordered?

## 2016-12-11 NOTE — Telephone Encounter (Signed)
Copied from [place] [id number]. Topic: Quick Communication - See Telephone Encounter >> Dec 11, 2016  9:41 AM [place], Laban Emperor wrote: CRM for notification. See Telephone encounter for:  Pt wife Rosann Auerbach would like Dr. Derrel Nip to put an lab order in to have the pt A1C check before his appt on 12/29/16 12/11/16.

## 2016-12-11 NOTE — Telephone Encounter (Signed)
Fasting labs ordered

## 2016-12-23 ENCOUNTER — Other Ambulatory Visit: Payer: Medicare Other

## 2016-12-24 DIAGNOSIS — L989 Disorder of the skin and subcutaneous tissue, unspecified: Secondary | ICD-10-CM | POA: Diagnosis not present

## 2016-12-25 ENCOUNTER — Other Ambulatory Visit (INDEPENDENT_AMBULATORY_CARE_PROVIDER_SITE_OTHER): Payer: Medicare Other

## 2016-12-25 DIAGNOSIS — E785 Hyperlipidemia, unspecified: Secondary | ICD-10-CM | POA: Diagnosis not present

## 2016-12-25 DIAGNOSIS — R7303 Prediabetes: Secondary | ICD-10-CM | POA: Diagnosis not present

## 2016-12-25 LAB — COMPREHENSIVE METABOLIC PANEL
ALT: 26 U/L (ref 0–53)
AST: 23 U/L (ref 0–37)
Albumin: 4.3 g/dL (ref 3.5–5.2)
Alkaline Phosphatase: 45 U/L (ref 39–117)
BUN: 12 mg/dL (ref 6–23)
CO2: 29 mEq/L (ref 19–32)
Calcium: 9.1 mg/dL (ref 8.4–10.5)
Chloride: 105 mEq/L (ref 96–112)
Creatinine, Ser: 0.98 mg/dL (ref 0.40–1.50)
GFR: 80.53 mL/min (ref >60.00)
Glucose, Bld: 98 mg/dL (ref 70–99)
Potassium: 4.2 mEq/L (ref 3.5–5.1)
Sodium: 139 mEq/L (ref 135–145)
Total Bilirubin: 0.8 mg/dL (ref 0.2–1.2)
Total Protein: 6.8 g/dL (ref 6.0–8.3)

## 2016-12-25 LAB — LIPID PANEL
Cholesterol: 173 mg/dL (ref 0–200)
HDL: 58.6 mg/dL (ref >39.00)
LDL Cholesterol: 99 mg/dL (ref 0–99)
NonHDL: 114.5
Total CHOL/HDL Ratio: 3
Triglycerides: 76 mg/dL (ref 0.0–149.0)
VLDL: 15.2 mg/dL (ref 0.0–40.0)

## 2016-12-25 LAB — HEMOGLOBIN A1C: Hgb A1c MFr Bld: 6.3 % (ref 4.6–6.5)

## 2016-12-25 LAB — MICROALBUMIN / CREATININE URINE RATIO
Creatinine,U: 14.2 mg/dL
Microalb Creat Ratio: 4.9 mg/g (ref 0.0–30.0)
Microalb, Ur: <0.7 mg/dL (ref 0.0–1.9)

## 2016-12-28 ENCOUNTER — Encounter: Payer: Self-pay | Admitting: Internal Medicine

## 2016-12-29 ENCOUNTER — Ambulatory Visit (INDEPENDENT_AMBULATORY_CARE_PROVIDER_SITE_OTHER): Payer: Medicare Other | Admitting: Internal Medicine

## 2016-12-29 ENCOUNTER — Encounter: Payer: Self-pay | Admitting: Internal Medicine

## 2016-12-29 VITALS — BP 114/78 | HR 75 | Temp 98.0°F | Resp 15 | Ht 70.0 in | Wt 227.8 lb

## 2016-12-29 DIAGNOSIS — E669 Obesity, unspecified: Secondary | ICD-10-CM | POA: Diagnosis not present

## 2016-12-29 DIAGNOSIS — J4520 Mild intermittent asthma, uncomplicated: Secondary | ICD-10-CM

## 2016-12-29 DIAGNOSIS — Z13820 Encounter for screening for osteoporosis: Secondary | ICD-10-CM

## 2016-12-29 DIAGNOSIS — Z125 Encounter for screening for malignant neoplasm of prostate: Secondary | ICD-10-CM | POA: Diagnosis not present

## 2016-12-29 DIAGNOSIS — E785 Hyperlipidemia, unspecified: Secondary | ICD-10-CM

## 2016-12-29 DIAGNOSIS — Z86010 Personal history of colonic polyps: Secondary | ICD-10-CM

## 2016-12-29 DIAGNOSIS — L400 Psoriasis vulgaris: Secondary | ICD-10-CM

## 2016-12-29 DIAGNOSIS — Z79899 Other long term (current) drug therapy: Secondary | ICD-10-CM

## 2016-12-29 LAB — PSA, MEDICARE: PSA: 0.7 ng/ml (ref 0.10–4.00)

## 2016-12-29 MED ORDER — ZOSTER VAC RECOMB ADJUVANTED 50 MCG/0.5ML IM SUSR: 0.5000 mL | Freq: Once | INTRAMUSCULAR | 1 refills | Status: AC

## 2016-12-29 NOTE — Assessment & Plan Note (Signed)
Mild, intermittent,  No long using maintenance inhalers. However, patient has been  taking cellcept and prednisone for the past 5 months.

## 2016-12-29 NOTE — Patient Instructions (Addendum)
We are checking your PSA today  I am checking with your insurance to see if you will be covered for a bone density test , because using prednisone is a risk for bone loss  Your  fasting glucose has never been  diagnostic of diabetes; but your A1c continues to suggest that  you are at risk for developing type 2 Diabetes .  Losing weight ,  Exercising  Regularly and following a low glycemic index diet .   I want your  goal to be 209 lbs     Start with a protein breakfast:    Danton Clap now makes a frozen breakfast frittata that can be microwaved in 2 minutes and is very low carb. Frittatas are similar to quiches without the crust. And he also makes an egg sandwhich "   [address]  Also makes  A "JustCrack on Egg"   .  The ShingRx vaccine is now available in local pharmacies and is much more protective thant Zostavax in preventing shingles.   It is therefore ADVISED for all interested adults over 50 to prevent shingles

## 2016-12-29 NOTE — Assessment & Plan Note (Signed)
Managed as eczema by [facility] (post biopsy0 with cellcept and prednisone
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
## 2016-12-29 NOTE — Assessment & Plan Note (Signed)
I have addressed  BMI and recommended wt loss of 10% of body weigh over the next 6 months using a low glycemic index diet and regular exercise a minimum of 5 days per week.   

## 2016-12-29 NOTE — Assessment & Plan Note (Signed)
Managed with red yeast rice,  Panel improved.Marland Kitchen  No changes  Lab Results  Component Value Date   CHOL 173 12/25/2016   HDL 58.60 12/25/2016   LDLCALC 99 12/25/2016   LDLDIRECT 161.8 10/15/2011   TRIG 76.0 12/25/2016   CHOLHDL 3 12/25/2016   Lab Results  Component Value Date   ALT 26 12/25/2016   AST 23 12/25/2016   ALKPHOS 45 12/25/2016   BILITOT 0.8 12/25/2016

## 2016-12-30 ENCOUNTER — Encounter: Payer: Self-pay | Admitting: Internal Medicine

## 2016-12-30 ENCOUNTER — Other Ambulatory Visit: Payer: Self-pay

## 2017-01-01 DIAGNOSIS — M7542 Impingement syndrome of left shoulder: Secondary | ICD-10-CM | POA: Diagnosis not present

## 2017-01-01 DIAGNOSIS — M25512 Pain in left shoulder: Secondary | ICD-10-CM | POA: Diagnosis not present

## 2017-01-14 ENCOUNTER — Ambulatory Visit: Admit: 2017-01-14 | Discharge: 2017-01-15 | Payer: MEDICARE

## 2017-01-14 DIAGNOSIS — L309 Dermatitis, unspecified: Secondary | ICD-10-CM | POA: Diagnosis not present

## 2017-01-14 DIAGNOSIS — Z79899 Other long term (current) drug therapy: Secondary | ICD-10-CM | POA: Diagnosis not present

## 2017-01-14 MED ORDER — MYCOPHENOLATE MOFETIL 500 MG TABLET
ORAL_TABLET | Freq: Two times a day (BID) | ORAL | 3 refills | 0.00000 days | Status: CP
Start: 2017-01-14 — End: 2017-06-01

## 2017-01-14 MED ORDER — PREDNISONE 5 MG TABLET
ORAL_TABLET | Freq: Every day | ORAL | 3 refills | 0.00000 days | Status: CP
Start: 2017-01-14 — End: 2017-04-01

## 2017-01-19 DIAGNOSIS — M7542 Impingement syndrome of left shoulder: Secondary | ICD-10-CM | POA: Diagnosis not present

## 2017-02-12 ENCOUNTER — Telehealth: Payer: Self-pay | Admitting: Emergency Medicine

## 2017-02-12 MED ORDER — PREDNISONE 10 MG PO TABS: ORAL_TABLET | ORAL | 0 refills | Status: DC

## 2017-02-12 NOTE — Telephone Encounter (Signed)
Spoke with the pt's spouse and notified of recs and she verbalized understanding  Rx was sent Nothing further needed

## 2017-02-12 NOTE — Telephone Encounter (Signed)
Please have him start symptomatic relief such as Tylenol Cold and flu, TheraFlu as directed Please have him take prednisone as follows: Take 30mg  daily for 3 days, then 20mg  daily for 3 days, then 10mg  daily for 3 days, then stop

## 2017-02-12 NOTE — Telephone Encounter (Signed)
Called and spoke with pt's wife, Rosann Auerbach who stated pt came home from work yesterday, 02/11/17 not feeling well.  Pt had a lot of head and chest congestion, chest tightness, headache, and was also wheezing.  Pt denies running a fever.  Dr. Lamonte Sakai, please advise recommendations on this for pt.  Thanks!

## 2017-04-01 MED ORDER — PREDNISONE 5 MG TABLET
ORAL_TABLET | ORAL | 0 refills | 0.00000 days | Status: CP
Start: 2017-04-01 — End: 2017-04-15

## 2017-04-15 ENCOUNTER — Ambulatory Visit: Admit: 2017-04-15 | Discharge: 2017-04-16 | Payer: MEDICARE

## 2017-04-15 DIAGNOSIS — L409 Psoriasis, unspecified: Secondary | ICD-10-CM | POA: Diagnosis not present

## 2017-04-15 DIAGNOSIS — Z79899 Other long term (current) drug therapy: Secondary | ICD-10-CM | POA: Diagnosis not present

## 2017-05-08 ENCOUNTER — Ambulatory Visit: Admit: 2017-05-08 | Discharge: 2017-05-08 | Payer: MEDICARE

## 2017-05-08 DIAGNOSIS — Z87891 Personal history of nicotine dependence: Secondary | ICD-10-CM | POA: Diagnosis not present

## 2017-05-08 DIAGNOSIS — M25561 Pain in right knee: Secondary | ICD-10-CM | POA: Diagnosis not present

## 2017-05-08 DIAGNOSIS — M25571 Pain in right ankle and joints of right foot: Secondary | ICD-10-CM | POA: Diagnosis not present

## 2017-05-08 DIAGNOSIS — Z96653 Presence of artificial knee joint, bilateral: Secondary | ICD-10-CM | POA: Diagnosis not present

## 2017-05-08 DIAGNOSIS — Z79899 Other long term (current) drug therapy: Secondary | ICD-10-CM | POA: Diagnosis not present

## 2017-05-08 DIAGNOSIS — Z7982 Long term (current) use of aspirin: Secondary | ICD-10-CM | POA: Diagnosis not present

## 2017-05-08 DIAGNOSIS — M25511 Pain in right shoulder: Secondary | ICD-10-CM | POA: Diagnosis not present

## 2017-05-08 DIAGNOSIS — M25512 Pain in left shoulder: Secondary | ICD-10-CM | POA: Diagnosis not present

## 2017-05-08 DIAGNOSIS — L409 Psoriasis, unspecified: Secondary | ICD-10-CM | POA: Diagnosis not present

## 2017-05-08 DIAGNOSIS — Z888 Allergy status to other drugs, medicaments and biological substances status: Secondary | ICD-10-CM | POA: Diagnosis not present

## 2017-05-08 DIAGNOSIS — Z114 Encounter for screening for human immunodeficiency virus [HIV]: Secondary | ICD-10-CM | POA: Diagnosis not present

## 2017-05-08 DIAGNOSIS — M25572 Pain in left ankle and joints of left foot: Secondary | ICD-10-CM | POA: Diagnosis not present

## 2017-05-08 DIAGNOSIS — M25521 Pain in right elbow: Secondary | ICD-10-CM | POA: Diagnosis not present

## 2017-05-08 DIAGNOSIS — M25522 Pain in left elbow: Secondary | ICD-10-CM | POA: Diagnosis not present

## 2017-05-08 DIAGNOSIS — Z7951 Long term (current) use of inhaled steroids: Secondary | ICD-10-CM | POA: Diagnosis not present

## 2017-05-08 DIAGNOSIS — M25562 Pain in left knee: Secondary | ICD-10-CM | POA: Diagnosis not present

## 2017-05-08 DIAGNOSIS — R21 Rash and other nonspecific skin eruption: Secondary | ICD-10-CM | POA: Diagnosis not present

## 2017-05-16 ENCOUNTER — Other Ambulatory Visit: Payer: Self-pay | Admitting: Emergency Medicine

## 2017-05-28 ENCOUNTER — Other Ambulatory Visit: Payer: Self-pay | Admitting: Emergency Medicine

## 2017-05-29 ENCOUNTER — Ambulatory Visit: Payer: Medicare Other | Admitting: Internal Medicine

## 2017-06-01 ENCOUNTER — Ambulatory Visit: Admit: 2017-06-01 | Discharge: 2017-06-02 | Payer: MEDICARE

## 2017-06-01 DIAGNOSIS — L409 Psoriasis, unspecified: Secondary | ICD-10-CM | POA: Diagnosis not present

## 2017-06-01 DIAGNOSIS — L309 Dermatitis, unspecified: Secondary | ICD-10-CM | POA: Diagnosis not present

## 2017-06-01 DIAGNOSIS — Z79899 Other long term (current) drug therapy: Secondary | ICD-10-CM | POA: Diagnosis not present

## 2017-06-01 MED ORDER — IXEKIZUMAB 80 MG/ML SUBCUTANEOUS AUTO-INJECTOR: Syringe | 1 refills | 0 days | Status: AC

## 2017-06-01 MED ORDER — IXEKIZUMAB 80 MG/ML SUBCUTANEOUS AUTO-INJECTOR
SUBCUTANEOUS | 0 refills | 0.00000 days | Status: CP
Start: 2017-06-01 — End: 2017-06-01

## 2017-06-01 MED ORDER — MYCOPHENOLATE MOFETIL 500 MG TABLET
ORAL_TABLET | Freq: Two times a day (BID) | ORAL | 3 refills | 0.00000 days | Status: CP
Start: 2017-06-01 — End: 2017-07-27

## 2017-06-01 NOTE — Unmapped (Signed)
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  Will start the process to begin Taltz.  In the meantime we will like the patient to continue CellCept to prevent further flareup.  - CONT mycophenolate (CELLCEPT) 500 mg tablet; Take 3 tablets (1,500 mg total) by mouth Two (2) times a day.  Dispense: 180 tablet; Refill: 3  - Continue clobetasol twice daily prn  - ixekizumab (TALTZ AUTOINJECTOR) 80 mg/mL AtIn; SubQ: 160 mg once, followed by 80 mg at weeks 2, 4, 6, 8, 10 and 12, and then 80 mg every 4 weeks.  Dispense: 5 Syringe; Refill: 1    High risk medication use  -Will recheck CBC with diff, AST/ALT, BUN/Creatine     RTC 2 months      Chief Complaint: f/u for palmoplantar eczema    HPI:  Dylan Leach is a 72 y.o. male who was last seen by me on 04/15/2017.    Interval history:  Palmoplantar eruption:   -Course: Discontinue developing new lesions on legs,, soles and worsening of hand lesions.  Lesions are not itchy.  He is completely off prednisone and continues with joint pains especially on his shoulders.  He was evaluated by rheumatology who does not think this is psoriatic arthritis  -Current tx: cellcept 1500mg  BID and OTC moisturizer; clobetasol BID    Denies any other skin concerns, including no other areas with bleeding, drainage, pain, pruritus, growth, or change.     Prior history:  - Previously treatments: wet wraps with TAC, topical treatment with clobetasol, humira, amlactin (urea not covered by insurance and cost prohibitive)  - Rash apparently responds to prednisone  - Office Light therapy not an option as they live in [place] and are 45 minutes from office  - Unclear how long and what dosage patient was on acitretin- patient and wife report GI side effects with this medication which is why it is listed as an allergy on patient's chart, denies having hives as a rash- favor retinoid dermatitis and not a true allergy  -  had patch testing which found + reactions to PPD, fragrance mix, quinoline, and quaternium 15. 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   Prior biopsy 08/11/16:  This biopsy has features in common with the previous one (DPD 1 8???0 1386). 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 However, both biopsies also show spongiotic alterations and neither have follicular hyperkeratosis or foci of acantholysis that might be clues to pityriasis rubra pilaris.  Drug reaction and cutaneous T-cell lymphoma should be included in the differential diagnosis.  No evidence of clonal T cell receptor gamma (TRG) gene rearrangement.  ??  Pertinent Past Medical History:   Several BCC treated by outside dermatologist    Family  History: Reviewed in Epic  No family history of melanoma   Sister with psoriasis    Review of Systems:  Joint pain otherwise The patient has not experienced any recent fever, chills,  headaches,  eye issues, nausea, vomiting, diarrhea, easy bruising , muscle pain, numbness, chest pain, shortness of breath, or recurrent infections.        Physical Examination:  General: Well-developed, well-nourished male in NAD  Neuro: A&Ox 3. Answers questions appropriately.  Skin: Face, scalp,  neck, trunk, bilateral upper extremities, bilateral lower extremities, hands, and feet:   -Palms presents erythematosus plaques with silvery scale and deep fissures  -Soles presents hyperkeratotic plaques  -Scalp, and scattered on elbows, arms and legs there are multiple pink, scaly macules    -All other areas examined within normal limits without any skin lesions of concern _________________________________________________________

## 2017-06-02 NOTE — Unmapped (Signed)
Per test claim for Taltz at the [facility], patient needs Medication Assistance Program for Prior Authorization.

## 2017-06-03 MED ORDER — SECUKINUMAB 150 MG/ML SUBCUTANEOUS PEN INJECTOR
INJECTION | SUBCUTANEOUS | 0 refills | 0.00000 days | Status: CP
Start: 2017-06-03 — End: 2017-06-03

## 2017-06-03 MED ORDER — SECUKINUMAB 150 MG/ML SUBCUTANEOUS PEN INJECTOR: mL | 0 refills | 0 days

## 2017-06-03 NOTE — Unmapped (Signed)
Per test claim for Cosentyx at the [facility], patient needs Medication Assistance Program for Prior Authorization.

## 2017-06-03 NOTE — Unmapped (Signed)
PA denied for Taltz,awaiting appeal.

## 2017-06-15 ENCOUNTER — Telehealth: Payer: Self-pay

## 2017-06-15 NOTE — Telephone Encounter (Signed)
Copied from [place] [phone]. Topic: Appointment Scheduling - Scheduling Inquiry for Clinic >> Jun 15, 2017  2:24 PM Synthia Innocent wrote: Reason for CRM: Would like to know if Dr Derrel Nip will be willing to see patient after 4pm for discussing family history. Please advise

## 2017-06-15 NOTE — Telephone Encounter (Signed)
I do NOT want a same day /urgent slot used to discuss a family history ,

## 2017-06-15 NOTE — Telephone Encounter (Signed)
Would it be okay to use one of your 4:00 or 4:30 slots for pt to come in a discuss family history with you?

## 2017-06-16 NOTE — Telephone Encounter (Signed)
Pt has a strong family history of abdominal aneursym's(sister, mother, uncle, and aunt) pt and pt's wife would like to have an abdominal US done to make sure he does not have one himself. They are wondering if an [place] can go ahead and be ordered since there is no available appts until the second week of July?

## 2017-06-16 NOTE — Telephone Encounter (Signed)
He had an abdominal ultrasound in November 2014 and no aortic aneurysm was seen. I do not think he needs another one,  But if he really wants to repeat one I will order it

## 2017-06-16 NOTE — Telephone Encounter (Signed)
Spoke with pt's wife and she stated that they would hold off on the [place] for now. She stated that she didn't remember him having the one in 2014.

## 2017-06-23 ENCOUNTER — Other Ambulatory Visit: Payer: Self-pay | Admitting: Emergency Medicine

## 2017-07-03 ENCOUNTER — Ambulatory Visit: Payer: Medicare Other | Admitting: Internal Medicine

## 2017-07-06 DIAGNOSIS — M7542 Impingement syndrome of left shoulder: Secondary | ICD-10-CM | POA: Diagnosis not present

## 2017-07-20 ENCOUNTER — Other Ambulatory Visit: Payer: Self-pay | Admitting: Emergency Medicine

## 2017-07-22 ENCOUNTER — Ambulatory Visit: Payer: Medicare Other | Admitting: Internal Medicine

## 2017-07-27 ENCOUNTER — Ambulatory Visit: Admit: 2017-07-27 | Discharge: 2017-07-28 | Payer: MEDICARE

## 2017-07-27 DIAGNOSIS — L309 Dermatitis, unspecified: Secondary | ICD-10-CM | POA: Diagnosis not present

## 2017-07-27 DIAGNOSIS — Z79899 Other long term (current) drug therapy: Secondary | ICD-10-CM | POA: Diagnosis not present

## 2017-07-27 MED ORDER — CLOBETASOL 0.05 % TOPICAL OINTMENT
OPHTHALMIC | 3 refills | 0.00000 days | Status: CP
Start: 2017-07-27 — End: 2017-10-28

## 2017-07-27 MED ORDER — MYCOPHENOLATE MOFETIL 500 MG TABLET
ORAL_TABLET | Freq: Every day | ORAL | 4 refills | 0.00000 days | Status: CP
Start: 2017-07-27 — End: 2017-09-29

## 2017-09-08 DIAGNOSIS — M7542 Impingement syndrome of left shoulder: Secondary | ICD-10-CM | POA: Diagnosis not present

## 2017-09-17 DIAGNOSIS — M25512 Pain in left shoulder: Secondary | ICD-10-CM | POA: Diagnosis not present

## 2017-09-17 DIAGNOSIS — M7542 Impingement syndrome of left shoulder: Secondary | ICD-10-CM | POA: Diagnosis not present

## 2017-09-24 DIAGNOSIS — M19012 Primary osteoarthritis, left shoulder: Secondary | ICD-10-CM | POA: Diagnosis not present

## 2017-09-24 DIAGNOSIS — Z23 Encounter for immunization: Secondary | ICD-10-CM | POA: Diagnosis not present

## 2017-09-24 DIAGNOSIS — M7542 Impingement syndrome of left shoulder: Secondary | ICD-10-CM | POA: Diagnosis not present

## 2017-09-28 ENCOUNTER — Ambulatory Visit: Admit: 2017-09-28 | Discharge: 2017-09-29 | Payer: MEDICARE

## 2017-09-28 DIAGNOSIS — Z79899 Other long term (current) drug therapy: Secondary | ICD-10-CM | POA: Diagnosis not present

## 2017-09-28 DIAGNOSIS — L409 Psoriasis, unspecified: Secondary | ICD-10-CM | POA: Diagnosis not present

## 2017-09-28 MED ORDER — FOLIC ACID 1 MG TABLET
ORAL_TABLET | Freq: Every day | ORAL | 3 refills | 0.00000 days | Status: CP
Start: 2017-09-28 — End: 2017-10-28

## 2017-09-28 MED ORDER — METHOTREXATE SODIUM 2.5 MG TABLET
ORAL_TABLET | ORAL | 1 refills | 0.00000 days | Status: CP
Start: 2017-09-28 — End: 2017-10-28

## 2017-10-03 ENCOUNTER — Other Ambulatory Visit: Payer: Self-pay | Admitting: Emergency Medicine

## 2017-10-05 ENCOUNTER — Telehealth: Payer: Self-pay | Admitting: Emergency Medicine

## 2017-10-05 MED ORDER — ALBUTEROL SULFATE HFA 108 (90 BASE) MCG/ACT IN AERS
INHALATION_SPRAY | RESPIRATORY_TRACT | 0 refills | Status: DC
Start: 2017-10-05 — End: 2017-10-24

## 2017-10-05 NOTE — Telephone Encounter (Signed)
Spoke with patient's wife Rosann Auerbach. She was requesting a refill on patient's albuterol. Advised her that the patient needed to have an OV before any more refills would be sent. Patient has been scheduled for an OV at 10/07/17 at 430 with RB. One month supply has been sent in.   She verbalized understanding. Nothing further needed at time of call.

## 2017-10-07 ENCOUNTER — Encounter: Payer: Self-pay | Admitting: Emergency Medicine

## 2017-10-07 ENCOUNTER — Ambulatory Visit (INDEPENDENT_AMBULATORY_CARE_PROVIDER_SITE_OTHER): Payer: Medicare Other | Admitting: Emergency Medicine

## 2017-10-07 ENCOUNTER — Ambulatory Visit (INDEPENDENT_AMBULATORY_CARE_PROVIDER_SITE_OTHER)
Admission: RE | Admit: 2017-10-07 | Discharge: 2017-10-07 | Disposition: A | Discharge status: Home / Self Care | Payer: Medicare Other | Source: Ambulatory Visit | Attending: Emergency Medicine | Admitting: Emergency Medicine

## 2017-10-07 VITALS — BP 124/80 | HR 78 | Ht 70.0 in | Wt 222.0 lb

## 2017-10-07 DIAGNOSIS — J9811 Atelectasis: Secondary | ICD-10-CM | POA: Diagnosis not present

## 2017-10-07 DIAGNOSIS — K219 Gastro-esophageal reflux disease without esophagitis: Secondary | ICD-10-CM | POA: Diagnosis not present

## 2017-10-07 DIAGNOSIS — L400 Psoriasis vulgaris: Secondary | ICD-10-CM | POA: Diagnosis not present

## 2017-10-07 DIAGNOSIS — J301 Allergic rhinitis due to pollen: Secondary | ICD-10-CM

## 2017-10-07 DIAGNOSIS — J4520 Mild intermittent asthma, uncomplicated: Secondary | ICD-10-CM | POA: Diagnosis not present

## 2017-10-07 DIAGNOSIS — Z5181 Encounter for therapeutic drug level monitoring: Secondary | ICD-10-CM

## 2017-10-07 NOTE — Assessment & Plan Note (Signed)
Previously had only mild intermittent symptoms but he has had some changes since last visit.  He is no longer on daily maintenance prednisone, he has a different exposure profile with a hot workplace, dust exposure more allergy exposure.  I think he needs to be on a maintenance medication, we will start Symbicort.  Assess him for stabilization of his symptoms on the new medication in about 6 weeks   We will start Symbicort 160/4.5 mcg, 2 puffs twice a day.  Remember to rinse and gargle after using this medication. Continue to use your Ventolin 2 puffs up to every 4 hours as needed for shortness of breath, chest tightness, wheezing. Follow with either Dr. Lamonte Sakai or APP in about 4 to 6 weeks to assess her progress on the new medication. Otherwise follow with Dr. Lamonte Sakai in 12 months

## 2017-10-07 NOTE — Progress Notes (Signed)
Subjective:    Patient ID: Nathan Ramirez, male    DOB: August 06, 1945, 72 y.o.   MRN: [id number]  HPI 72 year old man with a history of former tobacco use ( 20 pk-yrs), second hand smoke a a child, allergies formerly on imunotherapy, GERD, asthma dx about 18 yrs ago, has been on Advair for years. He has also been treated with Spiriva in the past, not currently. He had a flare in Dec '17, following a URI, required rx with pred + abx. He averages an AE about 4x a year. He has been having trouble with dermatitis on hands, has started seeing allergist lately. He also has GERD every day, is on lansoprazole every day.  He is not limited by breathing unless exacerbated. No cough, congestion, wheeze unless flaring. Other triggers include smoke, sometimes dust.   ROV 04/10/16 -- follow up for obstructive lung disease, hx COPD +/- asthma. He underwent pulmonary function testing today that I reviewed, show grossly normal airflows without a BD response (on Advair). Normal volumes and diffusion. Some possible subtle curve of the F/V loop. He does have some environmental allergies. He uses albuterol very rarely.   ROV 10/07/16 -- This is a follow-up visit for psoriasis, allergic rhinitis,  COPD and suspected clinical asthma. His pulmonary function testing has been reassuring but with some possible curve to his flow-volume loop.  He has been on prednisone 30mg  for the last 3 months for psoriasis in addition to his MTX. We stopped Advair last time to see how he would tolerate. He is using SABA at least once a day. He is exposed to some sort of solvent at work that causes chest tightness. He did start loratadine and flonase as planned - seem to be helping.   ROV 10/07/17 --this is a follow-up visit for 72 year old gentleman with a history of former tobacco, allergic rhinitis (formerly on immunotherapy) GERD and clinical asthma.  Also with psoriasis for which she is been treated with immunosuppression including prednisone,  cell cept and methotrexate. Currently on Cosentyx + MTX.  He has pulmonary function testing that suggest some mild obstruction based on curve of his flow volume loop.  He is been managed on albuterol alone.  He experienced flaring symptoms in January of this year and required a prednisone taper. He has had increased dyspnea this Summer due to heat and allergic exposures, new job location since last time, also some wheeze. He has used ventolin prn, as often as 3x a day. He is on lansoprazole, loratadine, flonase.    Review of Systems  Constitutional: Negative.  Negative for fever and unexpected weight change.  HENT: Negative for congestion, dental problem, ear pain, nosebleeds, postnasal drip, rhinorrhea, sinus pressure, sneezing, sore throat and trouble swallowing.   Eyes: Negative.  Negative for redness and itching.  Respiratory: Positive for cough, shortness of breath and wheezing. Negative for chest tightness.   Cardiovascular: Negative.  Negative for palpitations and leg swelling.  Gastrointestinal: Negative.  Negative for nausea and vomiting.  Genitourinary: Negative for dysuria.  Musculoskeletal: Negative.  Negative for joint swelling.  Skin: Negative.  Negative for rash.  Neurological: Negative for headaches.  Hematological: Negative.  Does not bruise/bleed easily.  Psychiatric/Behavioral: Negative.  Negative for dysphoric mood. The patient is not nervous/anxious.     Past Medical History:  Diagnosis Date  . Allergy   . Arthritis   . Asthma    "allergic"  . Carcinoma, basal cell, skin 2015   thigh  . Chicken pox   .  Colon polyp 2011?   pathology unknown  . Elevated blood pressure reading   . GERD (gastroesophageal reflux disease)    never had EGD  . Headache(784.0)    occasional tension  . Hypercholesteremia    borderline  . PONV (postoperative nausea and vomiting)      Family History  Problem Relation Age of Onset  . COPD Mother   . Lung cancer Father   . Prostate  cancer Father   . Diabetes Sister      Social History   Socioeconomic History  . Marital status: Married    Spouse name: Not on file  . Number of children: Not on file  . Years of education: Not on file  . Highest education level: Not on file  Occupational History  . Not on file  Social Needs  . Financial resource strain: Not on file  . Food insecurity:    Worry: Not on file    Inability: Not on file  . Transportation needs:    Medical: Not on file    Non-medical: Not on file  Tobacco Use  . Smoking status: Former Smoker    Packs/day: 1.00    Years: 40.00    Pack years: 40.00    Types: Cigarettes    Last attempt to quit: 01/14/1988    Years since quitting: 29.7  . Smokeless tobacco: Former Network engineer and Sexual Activity  . Alcohol use: Yes    Alcohol/week: 0.0 standard drinks    Comment: OCCASSIONALLY  . Drug use: No  . Sexual activity: Not on file  Lifestyle  . Physical activity:    Days per week: Not on file    Minutes per session: Not on file  . Stress: Not on file  Relationships  . Social connections:    Talks on phone: Not on file    Gets together: Not on file    Attends religious service: Not on file    Active member of club or organization: Not on file    Attends meetings of clubs or organizations: Not on file    Relationship status: Not on file  . Intimate partner violence:    Fear of current or ex partner: Not on file    Emotionally abused: Not on file    Physically abused: Not on file    Forced sexual activity: Not on file  Other Topics Concern  . Not on file  Social History Narrative  . Not on file  works as a Dealer / maintenance of heavy machinery.  Was [employer] [place], no known inhaled exposures there.  [place] native.   Allergies  Allergen Reactions  . Tamiflu [Oseltamivir Phosphate] Nausea And Vomiting and Other (See Comments)    Severe Headache  . Acitretin Rash     Outpatient Medications Prior to Visit  Medication Sig Dispense  Refill  . albuterol (PROVENTIL HFA;VENTOLIN HFA) 108 (90 Base) MCG/ACT inhaler 2 PUFFS EVERY 6 HOURS AS NEEDED FOR WHEEZING OR SHORTNESS OF BREATH 1 Inhaler 0  . aspirin 81 MG tablet Take 81 mg by mouth daily.    . Cholecalciferol (VITAMIN D PO) Take by mouth daily.    Marland Kitchen doxylamine, Sleep, (UNISOM) 25 MG tablet Take 25 mg by mouth at bedtime as needed.    . fluticasone (FLONASE) 50 MCG/ACT nasal spray PLACE 2 SPRAYS INTO BOTH NOSTRILS DAILY. 16 g 5  . folic acid (FOLVITE) 1 MG tablet Take 1 mg by mouth daily.    Marland Kitchen loratadine (  CLARITIN) 10 MG tablet TAKE 1 TABLET (10 MG TOTAL) BY MOUTH DAILY. 30 tablet 5  . Magnesium 100 MG CAPS Take 1 capsule by mouth daily.    . Melatonin 10 MG TABS Take 10 mg by mouth.    . methotrexate (RHEUMATREX) 2.5 MG tablet Take 10 mg by mouth once a week. Caution:Chemotherapy. Protect from light.    . Multiple Vitamins-Minerals (MULTI FOR HIM 50+) TABS Take 1 tablet by mouth daily.    . Omega-3 Fatty Acids (FISH OIL PO) Take by mouth.    . Red Yeast Rice Extract 600 MG CAPS Take 1 capsule by mouth 2 (two) times daily.    Marland Kitchen ibuprofen (ADVIL,MOTRIN) 200 MG tablet Take 200 mg by mouth every 6 (six) hours as needed.    . mycophenolate (CELLCEPT) 500 MG tablet TAKE 3 TABLETS (1,500 MG TOTAL) BY MOUTH TWO (2) TIMES A DAY.  5  . predniSONE (DELTASONE) 10 MG tablet Take 10 mg by mouth daily with breakfast.     . predniSONE (DELTASONE) 10 MG tablet 3 x 3 days 2 x 3 days, 1 x 3 days then resume regular dose 18 tablet 0  . VENTOLIN HFA 108 (90 Base) MCG/ACT inhaler TAKE 2 PUFFS BY MOUTH EVERY 6 HOURS AS NEEDED FOR WHEEZE OR SHORTNESS OF BREATH 1 Inhaler 0   No facility-administered medications prior to visit.         Objective:   Physical Exam Vitals:   10/07/17 1644  BP: 124/80  Pulse: 78  SpO2: 93%  Weight: 222 lb (100.7 kg)  Height: 5\' 10"  (1.778 m)   Gen: Pleasant, well-nourished, in no distress,  normal affect  ENT: No lesions,  mouth clear,  oropharynx clear,  no postnasal drip  Neck: No JVD, no stridor  Lungs: No use of accessory muscles, no wheeze, clear without rales or rhonchi  Cardiovascular: RRR, heart sounds normal, no murmur or gallops, no peripheral edema  Musculoskeletal: No deformities, no cyanosis or clubbing  Neuro: alert, non focal  Skin: Warm, no lesions or rash   CXR 10/07/17 --  COMPARISON:  PA and lateral chest x-ray of December 24, 2015  FINDINGS: The lungs are mildly hypoinflated. Stable bibasilar fibrotic changes present. There is no alveolar infiltrate or pleural effusion. The heart and pulmonary vascularity are normal. The bony thorax exhibits no acute abnormality.  IMPRESSION: Stable bibasilar fibrotic changes. No pneumonia, CHF, nor other acute cardiopulmonary abnormality.      Assessment & Plan:  Asthma Previously had only mild intermittent symptoms but he has had some changes since last visit.  He is no longer on daily maintenance prednisone, he has a different exposure profile with a hot workplace, dust exposure more allergy exposure.  I think he needs to be on a maintenance medication, we will start Symbicort.  Assess him for stabilization of his symptoms on the new medication in about 6 weeks   We will start Symbicort 160/4.5 mcg, 2 puffs twice a day.  Remember to rinse and gargle after using this medication. Continue to use your Ventolin 2 puffs up to every 4 hours as needed for shortness of breath, chest tightness, wheezing. Follow with either Dr. Lamonte Sakai or APP in about 4 to 6 weeks to assess her progress on the new medication. Otherwise follow with Dr. Lamonte Sakai in 12 months  Allergic rhinitis Continue loratadine, fluticasone nasal spray  GERD (gastroesophageal reflux disease) Continue lansoprazole  Plaque psoriasis On methotrexate and Cosentyx currently.  Both of these can potentially  cause pneumonitis or interstitial changes.  He needs a screening chest x-ray now.   Baltazar Apo, MD,  PhD 10/07/2017, 5:07 PM [facility] [phone] or if no answer [phone]

## 2017-10-07 NOTE — Assessment & Plan Note (Signed)
Continue lansoprazole. 

## 2017-10-07 NOTE — Patient Instructions (Signed)
We will perform a CXR today to compare with your priors We will start Symbicort 160/4.5 mcg, 2 puffs twice a day.  Remember to rinse and gargle after using this medication. Continue to use your Ventolin 2 puffs up to every 4 hours as needed for shortness of breath, chest tightness, wheezing. Continue your lansoprazole as you have been taking it. Continue loratadine and fluticasone nasal spray as you have been using them. Follow with either Dr. Lamonte Sakai or APP in about 4 to 6 weeks to assess her progress on the new medication. Otherwise follow with Dr. Lamonte Sakai in 12 months

## 2017-10-07 NOTE — Assessment & Plan Note (Signed)
On methotrexate and Cosentyx currently.  Both of these can potentially cause pneumonitis or interstitial changes.  He needs a screening chest x-ray now.

## 2017-10-07 NOTE — Assessment & Plan Note (Signed)
Continue loratadine, fluticasone nasal spray 

## 2017-10-12 DIAGNOSIS — J324 Chronic pansinusitis: Secondary | ICD-10-CM | POA: Diagnosis not present

## 2017-10-12 DIAGNOSIS — J205 Acute bronchitis due to respiratory syncytial virus: Secondary | ICD-10-CM | POA: Diagnosis not present

## 2017-10-13 DIAGNOSIS — M19012 Primary osteoarthritis, left shoulder: Secondary | ICD-10-CM | POA: Diagnosis not present

## 2017-10-24 ENCOUNTER — Other Ambulatory Visit: Payer: Self-pay | Admitting: Emergency Medicine

## 2017-10-27 ENCOUNTER — Telehealth: Payer: Self-pay

## 2017-10-27 NOTE — Telephone Encounter (Signed)
He should discuss with his rheumatologist

## 2017-10-27 NOTE — Telephone Encounter (Signed)
Yes he can get the shingles vaccine

## 2017-10-27 NOTE — Telephone Encounter (Signed)
Pt would like to get the shignles vaccine but wanted to make sure it was okay to get because he takes both Cosentyx and methotrexate. Is it okay for pt to still get the shingles vaccine?

## 2017-10-27 NOTE — Telephone Encounter (Signed)
Spoke with pt's wife and she stated that the pt does not see rheumatology that these medications are prescribed by dermatology. She stated that dermatology told pt that he would need to find out from his primary care doctor if he could the shingrix vaccine while taking both cosentyx and methotrexate.

## 2017-10-28 ENCOUNTER — Ambulatory Visit: Admit: 2017-10-28 | Discharge: 2017-10-29 | Payer: MEDICARE

## 2017-10-28 DIAGNOSIS — Z79899 Other long term (current) drug therapy: Secondary | ICD-10-CM | POA: Diagnosis not present

## 2017-10-28 DIAGNOSIS — L409 Psoriasis, unspecified: Secondary | ICD-10-CM | POA: Diagnosis not present

## 2017-10-28 DIAGNOSIS — L309 Dermatitis, unspecified: Secondary | ICD-10-CM | POA: Diagnosis not present

## 2017-10-28 MED ORDER — FOLIC ACID 1 MG TABLET
ORAL_TABLET | Freq: Every day | ORAL | 3 refills | 0.00000 days | Status: CP
Start: 2017-10-28 — End: 2018-02-16

## 2017-10-28 MED ORDER — METHOTREXATE SODIUM 2.5 MG TABLET
ORAL_TABLET | ORAL | 2 refills | 0.00000 days | Status: CP
Start: 2017-10-28 — End: 2018-02-17

## 2017-10-28 MED ORDER — UREA 40 % TOPICAL CREAM
5 refills | 0.00000 days | Status: CP
Start: 2017-10-28 — End: ?

## 2017-10-28 MED ORDER — CLOBETASOL 0.05 % TOPICAL OINTMENT
OPHTHALMIC | 3 refills | 0.00000 days | Status: CP
Start: 2017-10-28 — End: ?

## 2017-10-28 MED ORDER — SECUKINUMAB 150 MG/ML SUBCUTANEOUS PEN INJECTOR
SUBCUTANEOUS | 11 refills | 0.00000 days | Status: CP
Start: 2017-10-28 — End: ?

## 2017-10-28 NOTE — Unmapped (Signed)
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  Dispense: 16 tablet; Refill: 2  -Continue folic acid (FOLVITE) 1 MG tablet; Take 3 tablets (3 mg total) by mouth daily. Except the day you take methotrexate  Dispense: 100 tablet; Refill: 3  -Continue clobetasol (TEMOVATE) 0.05 % ointment; Apply twice daily to raised itchy areas until smooth  Dispense: 120 g; Refill: 3    High risk medication use-methotrexate  -Quant gold negative 06/01/2017  -Discussed the need to avoid alcohol use and bactrim  -ROS negative for fever, chills, fatigue, nausea, vomiting, diarrhea, abdominal pain, alopecia, headache, radiation/sunburn recall  or recent illness.   -Will recheck CBC with diff, AST/ALT, BUN/Creatine (per patient, OK to leave voicemail)    Clavus of the left foot  -Start over-the-counter urea (CARMOL) 40 % cream; Apply 1-2x daily to the hands/feet to decrease scaling  Dispense: 198.6 g; Refill: 5      RTC: Return in about 4 months (around 02/28/2018) for Palmoplantar psoraisis. or  Sooner as needed.         CC: Palmoplantar psoriasis follow-up    HPI:  [name] is a 72 y.o. male who was last seen by Dr. Dub Mikes on 09/28/2017, and was seen today by Ria Bush, MD for palmoplantar psoriasis follow-up    Interval history:  -He notes substantial improvement in his hands/feet with MTX 10mg  weekly, folic acid 3mg  daily, clobetasol BID PRN, and cosentyx. He notes some alcohol intake daily and we discussed minimizing and/or completing avoiding alcohol given the risk of liver toxicity while on MTX. ROS negative for fever, chills, fatigue, nausea, vomiting, diarrhea, abdominal pain, alopecia, headache,  radiation/sunburn recall or recent illness.     Denies any other skin concerns.      Prior history:  - Previously treatments: wet wraps with TAC, topical treatment with clobetasol, humira, amlactin (urea not covered by insurance and cost prohibitive)  - Rash apparently responds to prednisone  - Office Light therapy not an option as they live in [place] and are 45 minutes from office  - Unclear how long and what dosage patient was on acitretin- patient and wife report GI side effects with this medication which is why it is listed as an allergy on patient's chart, denies having hives as a rash- favor retinoid dermatitis and not a true allergy  -  had patch testing which found + reactions to PPD, fragrance mix, quinoline, and quaternium 15. 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   Prior biopsy 08/11/16:  This biopsy has features in common with the previous one (DPD 1 8???0 1386). 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 However, both biopsies also show spongiotic alterations and neither have follicular hyperkeratosis or foci of acantholysis that might be clues to pityriasis rubra pilaris.  Drug  reaction and cutaneous T-cell lymphoma should be included in the differential diagnosis.  No evidence of clonal T cell receptor gamma (TRG) gene rearrangement.  ??  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 No other skin complaints except as noted in HPI. All other systems reviewed are negative.    PE:  Gen: No acute distress  Neuro: Alert and oriented; answers questions appropriately.  Skin: Per patient request, examination limited to inspection and palpation of the hands, feet and significant for the below (all other areas examined were normal or had no significant findings):  -Palms with mild erythema and hyperkeratotic plaques without fissuring; significantly improved today  -Soles are clear except for firm hyperkeratotic papules/plaques of the plantar feet over weight-bearing areas without significant disruption of dermatoglyphs but some endophytic portions    -All other areas examined within normal limits without any skin lesions of concern    The patient was seen and examined by Ria Bush, MD who agrees with the assessment and plan as above.

## 2017-10-28 NOTE — Telephone Encounter (Signed)
Spoke with pt's wife and informed her that the pt can get the shingles vaccine. Wife gave a verbal understanding.

## 2017-10-29 NOTE — Unmapped (Signed)
Drug monitoring labs were reviewed in epic. The results are within normal limits and/or without significant abnormality. No change in management required. Patient updated via letter of the normal results and instructed to continue with the plan formulated at his recent visit.    Loreta Ave, M.D.  October 29, 2017 2:00 PM

## 2017-10-29 NOTE — Unmapped (Signed)
Per test claim for COSENTYX 150 MG/ML PEN at the [facility], patient needs Medication Assistance Program for Prior Authorization.

## 2017-11-04 ENCOUNTER — Encounter: Payer: Self-pay | Admitting: Nurse Practitioner

## 2017-11-04 ENCOUNTER — Ambulatory Visit (INDEPENDENT_AMBULATORY_CARE_PROVIDER_SITE_OTHER): Payer: Medicare Other | Admitting: Nurse Practitioner

## 2017-11-04 DIAGNOSIS — J4520 Mild intermittent asthma, uncomplicated: Secondary | ICD-10-CM | POA: Diagnosis not present

## 2017-11-04 MED ORDER — MOMETASONE FURO-FORMOTEROL FUM 200-5 MCG/ACT IN AERO: 2.0000 | INHALATION_SPRAY | Freq: Two times a day (BID) | RESPIRATORY_TRACT | 0 refills | Status: AC

## 2017-11-04 NOTE — Progress Notes (Signed)
@Patient  ID: Nathan Ramirez, male    DOB: June 26, 1945, 72 y.o.   MRN: [id number]  Chief Complaint  Patient presents with  . Follow-up    Symbicort    Referring provider: Crecencio Mc, MD  HPI 72 year old male former smoker with allergic rhinitis (formerly on immunotherapy) GERD and asthma followed by Dr. Lamonte Sakai.   Tests: CXR 10/07/17>> stable bibasilar scarring or atelectasis. No acute findings.   PFT Results Latest Ref Rng & Units 04/10/2016  FVC-Pre L 3.98  FVC-Predicted Pre % 88  FVC-Post L 3.99  FVC-Predicted Post % 89  Pre FEV1/FVC % % 78  Post FEV1/FCV % % 80  FEV1-Pre L 3.11  FEV1-Predicted Pre % 94  DLCO UNC% % 87  DLCO COR %Predicted % 95  TLC L 6.54  TLC % Predicted % 93  RV % Predicted % 99    OV 11/05/17 - Follow up Symbicort Patient presents today to follow up after starting Symbicort. He states that he could not tell any difference with Symbicort. He states that he has been doing well. He was having increased dyspnea over the summer due to heat and allergic exposures. He states that now that the seasons are changing that he is feeling much better. Denies shortness of breath, fever, or edema.    Allergies  Allergen Reactions  . Tamiflu [Oseltamivir Phosphate] Nausea And Vomiting and Other (See Comments)    Severe Headache  . Acitretin Rash    Immunization History  Administered Date(s) Administered  . Influenza Split 09/17/2011, 09/16/2014  . Influenza, High Dose Seasonal PF 10/23/2015, 09/26/2016, 09/24/2017  . Influenza,inj,Quad PF,6+ Mos 09/30/2012  . Influenza-Unspecified 10/13/2013  . Pneumococcal Conjugate-13 03/28/2014  . Pneumococcal Polysaccharide-23 10/11/2012  . Tdap 09/17/2012  . Zoster 10/17/2011  . Zoster Recombinat (Shingrix) 11/02/2017    Past Medical History:  Diagnosis Date  . Allergy   . Arthritis   . Asthma    "allergic"  . Carcinoma, basal cell, skin 2015   thigh  . Chicken pox   . Colon polyp 2011?   pathology  unknown  . Elevated blood pressure reading   . GERD (gastroesophageal reflux disease)    never had EGD  . Headache(784.0)    occasional tension  . Hypercholesteremia    borderline  . PONV (postoperative nausea and vomiting)     Tobacco History: Social History   Tobacco Use  Smoking Status Former Smoker  . Packs/day: 1.00  . Years: 40.00  . Pack years: 40.00  . Types: Cigarettes  . Last attempt to quit: 01/14/1988  . Years since quitting: 29.8  Smokeless Tobacco Former Systems developer   Counseling given: Yes   Outpatient Encounter Medications as of 11/04/2017  Medication Sig  . albuterol (VENTOLIN HFA) 108 (90 Base) MCG/ACT inhaler INHALE 2 PUFFS INTO LUNGS EVERY 6 HOURS AS NEEDED FOR WHEEZING/SHORTNESS OF BREATH  . aspirin 81 MG tablet Take 81 mg by mouth daily.  . Cholecalciferol (VITAMIN D PO) Take by mouth daily.  . clobetasol ointment (TEMOVATE) 0.05 % Apply twice daily to raised itchy areas until smooth  . doxylamine, Sleep, (UNISOM) 25 MG tablet Take 25 mg by mouth at bedtime as needed.  . fluticasone (FLONASE) 50 MCG/ACT nasal spray PLACE 2 SPRAYS INTO BOTH NOSTRILS DAILY.  . folic acid (FOLVITE) 1 MG tablet Take 1 mg by mouth daily.  Marland Kitchen loratadine (CLARITIN) 10 MG tablet TAKE 1 TABLET (10 MG TOTAL) BY MOUTH DAILY.  . Magnesium 100 MG CAPS Take  1 capsule by mouth daily.  . Melatonin 10 MG TABS Take 10 mg by mouth.  . meloxicam (MOBIC) 7.5 MG tablet Take 7.5 mg by mouth 2 (two) times daily as needed.  . methotrexate (RHEUMATREX) 2.5 MG tablet Take 10 mg by mouth once a week. Caution:Chemotherapy. Protect from light.  . Multiple Vitamins-Minerals (MULTI FOR HIM 50+) TABS Take 1 tablet by mouth daily.  . Omega-3 Fatty Acids (FISH OIL PO) Take by mouth.  . Red Yeast Rice Extract 600 MG CAPS Take 1 capsule by mouth 2 (two) times daily.  . Secukinumab 150 MG/ML SOAJ INJECT THE CONTENTS OF 2 PENS (300 MG) UNDER THE SKIN EVERY 4 WEEKS  . urea (CARMOL) 40 % CREA Apply 1-2x daily to the  hands/feet to decrease scaling  . mometasone-formoterol (DULERA) 200-5 MCG/ACT AERO Inhale 2 puffs into the lungs 2 (two) times daily.   No facility-administered encounter medications on file as of 11/04/2017.      Review of Systems  Review of Systems  Constitutional: Negative.  Negative for fever.  HENT: Negative.  Negative for congestion, sinus pressure and sinus pain.   Respiratory: Negative for cough, shortness of breath and wheezing.   Cardiovascular: Negative.  Negative for chest pain, palpitations and leg swelling.  Gastrointestinal: Negative.   Allergic/Immunologic: Negative.   Neurological: Negative.   Psychiatric/Behavioral: Negative.        Physical Exam  BP 118/70 (BP Location: Left Arm, Patient Position: Sitting, Cuff Size: Normal)   Pulse 74   Ht 5\' 10"  (1.778 m)   Wt 226 lb 9.6 oz (102.8 kg)   SpO2 95%   BMI 32.51 kg/m   Wt Readings from Last 5 Encounters:  11/04/17 226 lb 9.6 oz (102.8 kg)  10/07/17 222 lb (100.7 kg)  12/29/16 227 lb 12.8 oz (103.3 kg)  10/07/16 234 lb (106.1 kg)  04/10/16 226 lb (102.5 kg)     Physical Exam  Constitutional: He is oriented to person, place, and time. He appears well-developed and well-nourished. No distress.  Cardiovascular: Normal rate and regular rhythm.  Pulmonary/Chest: Effort normal and breath sounds normal.  Neurological: He is alert and oriented to person, place, and time.  Skin: Skin is warm and dry.  Psychiatric: He has a normal mood and affect.  Nursing note and vitals reviewed.   Imaging: Dg Chest 2 View  Result Date: 10/08/2017 CLINICAL DATA:  Routine, no chest complains, hx of asthma. EXAM: CHEST - 2 VIEW COMPARISON:  10/07/2016 FINDINGS: Stable linear scarring or subsegmental atelectasis in the lung bases left greater than right. No new infiltrate or overt edema. Heart size and mediastinal contours are within normal limits. No effusion. Visualized bones unremarkable. IMPRESSION: Stable bibasilar  scarring or atelectasis.  No acute findings. Electronically Signed   By: Lucrezia Europe M.D.   On: 10/08/2017 09:07     Assessment & Plan:   Asthma Patient Instructions  OK to stop Symbicort Will try dulera 2 puffs BID Continue all other medications Stay active Rinse mouth after each use Follow up with Dr. Lamonte Sakai in 4-6 weeks or sooner if needed       Fenton Foy, NP 11/05/2017

## 2017-11-04 NOTE — Patient Instructions (Addendum)
OK to stop Symbicort Will try dulera 2 puffs BID Continue all other medications Stay active Rinse mouth after each use Follow up with Dr. Lamonte Sakai in 4-6 weeks or sooner if needed

## 2017-11-05 ENCOUNTER — Encounter: Payer: Self-pay | Admitting: Nurse Practitioner

## 2017-11-05 NOTE — Assessment & Plan Note (Signed)
Patient Instructions  OK to stop Symbicort Will try dulera 2 puffs BID Continue all other medications Stay active Rinse mouth after each use Follow up with Dr. Lamonte Sakai in 4-6 weeks or sooner if needed

## 2017-11-11 MED ORDER — METHOTREXATE SODIUM 2.5 MG TABLET
ORAL_TABLET | 1 refills | 0.00000 days | Status: CP
Start: 2017-11-11 — End: ?

## 2017-11-12 DIAGNOSIS — M7541 Impingement syndrome of right shoulder: Secondary | ICD-10-CM | POA: Diagnosis not present

## 2017-11-15 ENCOUNTER — Other Ambulatory Visit: Payer: Self-pay | Admitting: Emergency Medicine

## 2017-11-25 ENCOUNTER — Telehealth: Payer: Self-pay | Admitting: Emergency Medicine

## 2017-11-25 NOTE — Telephone Encounter (Signed)
Pt seen by Lazaro Arms, NP last:    Return in about 4 weeks (around 12/02/2017) for follow up Dr. Lamonte Sakai.  OK to stop Symbicort Will try dulera 2 puffs BID Continue all other medications Stay active Rinse mouth after each use Follow up with Dr. Lamonte Sakai in 4-6 weeks or sooner if needed       Spoke with patients wife-states patient can not tell any difference in his breathing with Dulera. Symbicort did not help either. Pt choose to go back to his rescue inhaler only.  Tonya please advise.

## 2017-11-25 NOTE — Telephone Encounter (Signed)
Ok thank you 

## 2017-11-27 MED ORDER — HALOBETASOL PROPIONATE 0.05 % TOPICAL OINTMENT: g | Freq: Two times a day (BID) | 2 refills | 0 days | Status: AC

## 2017-11-27 MED ORDER — HALOBETASOL PROPIONATE 0.05 % TOPICAL OINTMENT
Freq: Two times a day (BID) | TOPICAL | 2 refills | 0.00000 days | Status: CP
Start: 2017-11-27 — End: 2018-11-27

## 2017-11-27 NOTE — Unmapped (Signed)
Spoke with patient's wife. His fingers are beginning to crack badly again. Using clobetasol twice a day. Still on Cosentyx and methotrexate. He has otherwise done very well on this regimen but they are concerned this is the start of a severe flare like he previously had.     We will try switching to halobetasol if this is covered by insurance and recommend he use a cake layer frosting to the affected areas twice a day, and under white cotton gloves at night.

## 2017-11-27 NOTE — Unmapped (Signed)
Patient's wife called to say that his hands are severely cracked; his feet are doing well but hands are much worse; Currently on Cosentyx as well as MTX and Clobetasol    Please contact patient and advise on what can help his hands. Contact number is [phone]; Wife will not be able to answer the phone @ 130 pm tomorrow b/c she will be having a CT scan; however, she said to please leave a message if she doesn't pick up     If anything needs to be prescribed, the pharmacy is [facility] [place] [phone]    Lanora Manis

## 2017-12-03 DIAGNOSIS — M7542 Impingement syndrome of left shoulder: Secondary | ICD-10-CM | POA: Diagnosis not present

## 2017-12-03 DIAGNOSIS — M13812 Other specified arthritis, left shoulder: Secondary | ICD-10-CM | POA: Diagnosis not present

## 2017-12-03 DIAGNOSIS — M75102 Unspecified rotator cuff tear or rupture of left shoulder, not specified as traumatic: Secondary | ICD-10-CM | POA: Diagnosis not present

## 2017-12-16 ENCOUNTER — Ambulatory Visit (INDEPENDENT_AMBULATORY_CARE_PROVIDER_SITE_OTHER): Payer: Medicare Other | Admitting: Emergency Medicine

## 2017-12-16 ENCOUNTER — Encounter: Payer: Self-pay | Admitting: Emergency Medicine

## 2017-12-16 DIAGNOSIS — J4520 Mild intermittent asthma, uncomplicated: Secondary | ICD-10-CM | POA: Diagnosis not present

## 2017-12-16 NOTE — Progress Notes (Signed)
Subjective:    Patient ID: Nathan Ramirez, male    DOB: 1945-11-03, 72 y.o.   MRN: [id number]  HPI  ROV 10/07/17 --this is a follow-up visit for 72 year old gentleman with a history of former tobacco, allergic rhinitis (formerly on immunotherapy) GERD and clinical asthma.  Also with psoriasis for which she is been treated with immunosuppression including prednisone, cell cept and methotrexate.  He has pulmonary function testing that suggest some mild obstruction based on curve of his flow volume loop.  He is been managed on albuterol alone.  He experienced flaring symptoms in January of this year and required a prednisone taper. He has had increased dyspnea this Summer due to heat and allergic exposures, new job location since last time, also some wheeze. He has used ventolin prn, as often as 3x a day. He is on lansoprazole, loratadine, flonase.   ROV 12/16/17 --Mr. Dumlao has a history of tobacco use and mild obstructive lung disease, asthmatic COPD.  He also has allergic rhinitis, GERD, psoriasis on immunosuppression (Cosentyx and mycophenylate).  Over the last several months he has had more respiratory symptoms (question since coming off prednisone).  I tried him on Symbicort to see if he would benefit, he was subsequently changed to [facility] to evaluate this as well. He doesn't believe that  He reports that his breathing is "ok", but he still has some improvement with albuterol. His exercise tolerance is adequate, still able to work. Some tasks will wind him. Flu shot up to date. PNA shot up to date.    Review of Systems  Constitutional: Negative.  Negative for fever and unexpected weight change.  HENT: Negative for congestion, dental problem, ear pain, nosebleeds, postnasal drip, rhinorrhea, sinus pressure, sneezing, sore throat and trouble swallowing.   Eyes: Negative.  Negative for redness and itching.  Respiratory: Positive for cough, shortness of breath and wheezing. Negative for chest  tightness.   Cardiovascular: Negative.  Negative for palpitations and leg swelling.  Gastrointestinal: Negative.  Negative for nausea and vomiting.  Genitourinary: Negative for dysuria.  Musculoskeletal: Negative.  Negative for joint swelling.  Skin: Negative.  Negative for rash.  Neurological: Negative for headaches.  Hematological: Negative.  Does not bruise/bleed easily.  Psychiatric/Behavioral: Negative.  Negative for dysphoric mood. The patient is not nervous/anxious.     Past Medical History:  Diagnosis Date  . Allergy   . Arthritis   . Asthma    "allergic"  . Carcinoma, basal cell, skin 2015   thigh  . Chicken pox   . Colon polyp 2011?   pathology unknown  . Elevated blood pressure reading   . GERD (gastroesophageal reflux disease)    never had EGD  . Headache(784.0)    occasional tension  . Hypercholesteremia    borderline  . PONV (postoperative nausea and vomiting)      Family History  Problem Relation Age of Onset  . COPD Mother   . Lung cancer Father   . Prostate cancer Father   . Diabetes Sister      Social History   Socioeconomic History  . Marital status: Married    Spouse name: Not on file  . Number of children: Not on file  . Years of education: Not on file  . Highest education level: Not on file  Occupational History  . Not on file  Social Needs  . Financial resource strain: Not on file  . Food insecurity:    Worry: Not on file    Inability:  Not on file  . Transportation needs:    Medical: Not on file    Non-medical: Not on file  Tobacco Use  . Smoking status: Former Smoker    Packs/day: 1.00    Years: 40.00    Pack years: 40.00    Types: Cigarettes    Last attempt to quit: 01/14/1988    Years since quitting: 29.9  . Smokeless tobacco: Former Network engineer and Sexual Activity  . Alcohol use: Yes    Alcohol/week: 0.0 standard drinks    Comment: OCCASSIONALLY  . Drug use: No  . Sexual activity: Not on file  Lifestyle  . Physical  activity:    Days per week: Not on file    Minutes per session: Not on file  . Stress: Not on file  Relationships  . Social connections:    Talks on phone: Not on file    Gets together: Not on file    Attends religious service: Not on file    Active member of club or organization: Not on file    Attends meetings of clubs or organizations: Not on file    Relationship status: Not on file  . Intimate partner violence:    Fear of current or ex partner: Not on file    Emotionally abused: Not on file    Physically abused: Not on file    Forced sexual activity: Not on file  Other Topics Concern  . Not on file  Social History Narrative  . Not on file  works as a Dealer / maintenance of heavy machinery.  Was [employer] [place], no known inhaled exposures there.  [place] native.   Allergies  Allergen Reactions  . Tamiflu [Oseltamivir Phosphate] Nausea And Vomiting and Other (See Comments)    Severe Headache  . Acitretin Rash     Outpatient Medications Prior to Visit  Medication Sig Dispense Refill  . albuterol (VENTOLIN HFA) 108 (90 Base) MCG/ACT inhaler INHALE 2 PUFFS INTO LUNGS EVERY 6 HOURS AS NEEDED FOR WHEEZING/SHORTNESS OF BREATH 1 Inhaler 5  . aspirin 81 MG tablet Take 81 mg by mouth daily.    . Cholecalciferol (VITAMIN D PO) Take by mouth daily.    . clobetasol ointment (TEMOVATE) 0.05 % Apply twice daily to raised itchy areas until smooth    . doxylamine, Sleep, (UNISOM) 25 MG tablet Take 25 mg by mouth at bedtime as needed.    . fluticasone (FLONASE) 50 MCG/ACT nasal spray SPRAY 2 SPRAYS INTO EACH NOSTRIL EVERY DAY 16 g 5  . folic acid (FOLVITE) 1 MG tablet Take 1 mg by mouth daily.    Marland Kitchen loratadine (CLARITIN) 10 MG tablet TAKE 1 TABLET (10 MG TOTAL) BY MOUTH DAILY. 30 tablet 5  . Magnesium 100 MG CAPS Take 1 capsule by mouth daily.    . Melatonin 10 MG TABS Take 10 mg by mouth.    . meloxicam (MOBIC) 7.5 MG tablet Take 7.5 mg by mouth 2 (two) times daily as needed.  1  .  mometasone-formoterol (DULERA) 200-5 MCG/ACT AERO Inhale 2 puffs into the lungs 2 (two) times daily. 1 Inhaler 0  . Multiple Vitamins-Minerals (MULTI FOR HIM 50+) TABS Take 1 tablet by mouth daily.    . Omega-3 Fatty Acids (FISH OIL PO) Take by mouth.    . Red Yeast Rice Extract 600 MG CAPS Take 1 capsule by mouth 2 (two) times daily.    . Secukinumab 150 MG/ML SOAJ INJECT THE CONTENTS OF 2 PENS (  300 MG) UNDER THE SKIN EVERY 4 WEEKS    . urea (CARMOL) 40 % CREA Apply 1-2x daily to the hands/feet to decrease scaling    . methotrexate (RHEUMATREX) 2.5 MG tablet Take 10 mg by mouth once a week. Caution:Chemotherapy. Protect from light.     No facility-administered medications prior to visit.         Objective:   Physical Exam Vitals:   12/16/17 1618  BP: 120/70  Pulse: 70  SpO2: 93%  Weight: 228 lb 9.6 oz (103.7 kg)  Height: 5\' 10"  (1.778 m)   Gen: Pleasant, obese, in no distress,  normal affect  ENT: No lesions,  mouth clear,  oropharynx clear, no postnasal drip  Neck: No JVD, no stridor  Lungs: No use of accessory muscles, no wheeze, clear without rales or rhonchi  Cardiovascular: RRR, heart sounds normal, no murmur or gallops, no peripheral edema  Musculoskeletal: No deformities, no cyanosis or clubbing  Neuro: alert, non focal  Skin: Warm, no lesions or rash   CXR 10/07/17 --  COMPARISON:  PA and lateral chest x-ray of December 24, 2015  FINDINGS: The lungs are mildly hypoinflated. Stable bibasilar fibrotic changes present. There is no alveolar infiltrate or pleural effusion. The heart and pulmonary vascularity are normal. The bony thorax exhibits no acute abnormality.  IMPRESSION: Stable bibasilar fibrotic changes. No pneumonia, CHF, nor other acute cardiopulmonary abnormality.      Assessment & Plan:  Asthma Suspected mild intermittent asthma.  He did not benefit from either Symbicort or Dulera.  He does still feel that he gets some benefit when he uses  Ventolin.  I think at this point we can hold off on controller medication, continue to use albuterol just as needed.  If his dyspnea pattern, albuterol use changes and certainly we could reconsider.  He has had his flu shot and pneumonia vaccines are up-to-date.  Continue to manage potential exacerbating factors including GERD and allergic rhinitis.   Baltazar Apo, MD, PhD 12/16/2017, 5:08 PM [facility] [phone] or if no answer [phone]

## 2017-12-16 NOTE — Assessment & Plan Note (Signed)
Suspected mild intermittent asthma.  He did not benefit from either Symbicort or Dulera.  He does still feel that he gets some benefit when he uses Ventolin.  I think at this point we can hold off on controller medication, continue to use albuterol just as needed.  If his dyspnea pattern, albuterol use changes and certainly we could reconsider.  He has had his flu shot and pneumonia vaccines are up-to-date.  Continue to manage potential exacerbating factors including GERD and allergic rhinitis.

## 2017-12-16 NOTE — Patient Instructions (Signed)
Please keep your albuterol available to use 2 puffs up to every 4 hours if needed for shortness of breath, chest tightness, wheezing. We will not start an every day scheduled inhaled medicine right now.  We will reconsider going forward depending on her symptoms and albuterol needs. Flu shot is up-to-date. Follow with Dr Lamonte Sakai in 6 months or sooner if you have any problems

## 2017-12-22 NOTE — Unmapped (Signed)
Faxed prescriber form to [employer] for [employer]

## 2018-02-16 MED ORDER — FOLIC ACID 1 MG TABLET
ORAL_TABLET | 3 refills | 0.00000 days | Status: CP
Start: 2018-02-16 — End: 2018-08-27

## 2018-02-17 MED ORDER — METHOTREXATE SODIUM 2.5 MG TABLET
ORAL_TABLET | 0 refills | 0.00000 days | Status: CP
Start: 2018-02-17 — End: 2018-04-08

## 2018-02-17 NOTE — Unmapped (Signed)
-  One month MTX refill sent given pending appt on 2/19

## 2018-02-26 NOTE — Unmapped (Signed)
Call from wife, reporting pt will be undergoing Dental surgery for implant removal, and surgical teeth extraction.    Pt needs to be off of Methotrexate for two weeks, Dr. Jeanice Lim needs to know if that is ok?

## 2018-03-03 ENCOUNTER — Ambulatory Visit: Admit: 2018-03-03 | Discharge: 2018-03-04 | Payer: MEDICARE

## 2018-03-03 DIAGNOSIS — Z79899 Other long term (current) drug therapy: Secondary | ICD-10-CM | POA: Diagnosis not present

## 2018-03-03 DIAGNOSIS — L409 Psoriasis, unspecified: Secondary | ICD-10-CM | POA: Diagnosis not present

## 2018-03-03 LAB — CBC W/ AUTO DIFF
BASOPHILS RELATIVE PERCENT: 1.2 %
EOSINOPHILS ABSOLUTE COUNT: 0.6 10*9/L — ABNORMAL HIGH (ref 0.0–0.4)
HEMATOCRIT: 47.9 % (ref 41.0–53.0)
HEMOGLOBIN: 14.8 g/dL (ref 13.5–17.5)
LARGE UNSTAINED CELLS: 4 % (ref 0–4)
LYMPHOCYTES ABSOLUTE COUNT: 2.1 10*9/L (ref 1.5–5.0)
LYMPHOCYTES RELATIVE PERCENT: 25.2 %
MEAN CORPUSCULAR HEMOGLOBIN CONC: 30.9 g/dL — ABNORMAL LOW (ref 31.0–37.0)
MEAN CORPUSCULAR HEMOGLOBIN: 33 pg (ref 26.0–34.0)
MEAN CORPUSCULAR VOLUME: 107.1 fL — ABNORMAL HIGH (ref 80.0–100.0)
MONOCYTES ABSOLUTE COUNT: 0.7 10*9/L (ref 0.2–0.8)
MONOCYTES RELATIVE PERCENT: 8.1 %
NEUTROPHILS ABSOLUTE COUNT: 4.6 10*9/L (ref 2.0–7.5)
NEUTROPHILS RELATIVE PERCENT: 55 %
RED BLOOD CELL COUNT: 4.48 10*12/L — ABNORMAL LOW (ref 4.50–5.90)
RED CELL DISTRIBUTION WIDTH: 14.1 % (ref 12.0–15.0)
WBC ADJUSTED: 8.4 10*9/L (ref 4.5–11.0)

## 2018-03-03 LAB — CREATININE
Creatinine:MCnc:Pt:Ser/Plas:Qn:: 0.88
EGFR CKD-EPI AA MALE: >90 mL/min/{1.73_m2} (ref >=60)

## 2018-03-03 LAB — AST (SGOT): Aspartate aminotransferase:CCnc:Pt:Ser/Plas:Qn:: 44

## 2018-03-03 LAB — ALT (SGPT): Alanine aminotransferase:CCnc:Pt:Ser/Plas:Qn:: 38

## 2018-03-03 LAB — SMEAR REVIEW

## 2018-03-03 LAB — BLOOD UREA NITROGEN: Urea nitrogen:MCnc:Pt:Ser/Plas:Qn:: 16

## 2018-03-03 LAB — LYMPHOCYTES ABSOLUTE COUNT: Lab: 2.1

## 2018-03-04 NOTE — Unmapped (Signed)
Attempted to reach patient by phone (1st attempt) to discuss results and treatment plan. Left voicemail notifying patient I would try again the next business day.      Theora Gianotti, MD  PGY-2 Resident Physician  [facility] Department of Dermatology

## 2018-03-04 NOTE — Unmapped (Signed)
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  -Continue over-the-counter urea (CARMOL) 40 % cream; Apply 1-2x daily to the hands/feet to decrease scaling.  -Continue methotrexate 2.5 MG tablet; Take 4 tablets (10 mg total) by mouth once a week. Would consider decreasing to 3 tablets if remains well-controlled at next visit.  -Continue folic acid (FOLVITE) 1 MG tablet; Take 3 tablets (3 mg total) by mouth daily. Except the day you take methotrexate.  -Continue clobetasol (TEMOVATE) 0.05 % ointment; Apply twice daily to raised itchy areas until smooth.  -Declines refills of all medications today; will call if runs out before next visit.    High risk medication use (MTX):  -Cumulative dose: ~200 mg.  -Quant gold negative 06/01/2017. Repeat at next visit.  -Recheck monitoring panel today.    Clavus of the left foot--improving:  -Continue over-the-counter urea (CARMOL) 40 % cream; Apply 1-2x daily to the hands/feet to decrease scaling.      RTC: Return in about 4 months (around 07/02/2018) for Psoriasis. or  Sooner as needed.         CC: Palmoplantar psoriasis follow-up    HPI:  Dylan Leach is a 73 y.o. male who was last seen by Dr. Amada Kingfisher on 10/28/2017, and was seen today by Ria Bush, MD for palmoplantar psoriasis follow-up    At [facility], continued on Consentyx, MTX 10 mg weekly, Clobetasol ointment PRN, and started urea cream. Today reports things have significantly improved on current regimen. He continues to have some fissuring of fingertips which he covers with superglue. He works with metal and presents with some dark/dirty discoloration of his hands (R>L) today. Tolerating medications well without significant side effects, fatigue, stomach upset, or other symptoms.    Denies any other skin concerns. Prior history:  - Previously treatments: wet wraps with TAC, topical treatment with clobetasol, humira, amlactin (urea not covered by insurance and cost prohibitive)  - Rash apparently responds to prednisone  - Office Light therapy not an option as they live in [place] and are 45 minutes from office  - Unclear how long and what dosage patient was on acitretin- patient and wife report GI side effects with this medication which is why it is listed as an allergy on patient's chart, denies having hives as a rash- favor retinoid dermatitis and not a true allergy  -  had patch testing which found + reactions to PPD, fragrance mix, quinoline, and quaternium 15. 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   Prior biopsy 08/11/16:  This biopsy has features in common with the previous one (DPD 1 8???0 1386). 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 However, both biopsies also show spongiotic alterations and neither have follicular hyperkeratosis or foci of acantholysis that might be clues to pityriasis rubra pilaris.  Drug reaction and cutaneous T-cell lymphoma should be included in the differential diagnosis.  No evidence of clonal T cell receptor gamma (TRG) gene rearrangement.  ??  Pertinent Past Medical History:   Several BCC treated by outside dermatologist    Family  History: Reviewed in Epic  No family  history of melanoma   Sister with psoriasis    Past Medical History, Family History, Social History, Meds, Allergies, Problem List Reviewed in Epic and notable as indicated above  Here with wife    ROS:   Baseline state of health. No other skin complaints except as noted in HPI. All other systems reviewed are negative.    Physical Examination:  General: Well-developed, well-nourished male in no acute distress, resting comfortably.  Neuro: A&Ox3, answers questions appropriately.  Psych: Congruent mood and affect.  Skin: Per patient request, focused examination of the head, B/L hands, B/L feet was performed and notable for the following:  - Hands with mild erythema, hyperlinear palms, and fissuring at fingertips with dark/dirty discoloration of hands (R>L; from metalwork per patient).  - Soles clear.  - All other areas not specifically commented on are within normal limits.    The patient was seen and examined by Ria Bush, MD who agrees with the assessment and plan as above.

## 2018-03-05 NOTE — Unmapped (Signed)
Reviewed results of MTX monitoring panel. Labs with slightly rising MCV, but currently <110. Remainder of labs with no significant change from baseline. Patient informed of results through phone/VM.      Theora Gianotti, MD  PGY-2 Resident Physician  [facility] Department of Dermatology

## 2018-03-28 DIAGNOSIS — J45909 Unspecified asthma, uncomplicated: Secondary | ICD-10-CM | POA: Diagnosis not present

## 2018-03-28 DIAGNOSIS — R0602 Shortness of breath: Secondary | ICD-10-CM | POA: Diagnosis not present

## 2018-03-28 DIAGNOSIS — J209 Acute bronchitis, unspecified: Secondary | ICD-10-CM | POA: Diagnosis not present

## 2018-04-08 DIAGNOSIS — L409 Psoriasis, unspecified: Principal | ICD-10-CM

## 2018-04-08 MED ORDER — METHOTREXATE SODIUM 2.5 MG TABLET
ORAL_TABLET | 3 refills | 0.00000 days | Status: CP
Start: 2018-04-08 — End: ?

## 2018-05-12 ENCOUNTER — Telehealth: Payer: Self-pay | Admitting: Emergency Medicine

## 2018-05-12 NOTE — Telephone Encounter (Signed)
Returned call and spoke with wife who was with patient.  Patient experiencing increasing SOB and asthma symptoms all Spring.  Feels he made need medication adjustments/alternatives.  Sometimes uses albuterol inhaler as much as 8 times per day.  States he is trying to manage on his own but keeps cycling back into episodes of SOB.  Patient works and feels this is affecting all areas of his life and would like to discuss other options.  Televisit scheduled with Tonia Brooms, NP for 05/13/18 at 4pm.  Nothing further needed at this time.

## 2018-05-13 ENCOUNTER — Ambulatory Visit (INDEPENDENT_AMBULATORY_CARE_PROVIDER_SITE_OTHER): Payer: Medicare Other | Admitting: Pulmonary Disease

## 2018-05-13 ENCOUNTER — Encounter: Payer: Self-pay | Admitting: Pulmonary Disease

## 2018-05-13 ENCOUNTER — Other Ambulatory Visit: Payer: Self-pay

## 2018-05-13 DIAGNOSIS — J4521 Mild intermittent asthma with (acute) exacerbation: Secondary | ICD-10-CM | POA: Diagnosis not present

## 2018-05-13 MED ORDER — FLUTICASONE FUROATE-VILANTEROL 200-25 MCG/INH IN AEPB: 1.0000 | INHALATION_SPRAY | Freq: Every day | RESPIRATORY_TRACT | 0 refills | Status: DC

## 2018-05-13 MED ORDER — PREDNISONE 10 MG PO TABS: ORAL_TABLET | ORAL | 0 refills | Status: DC

## 2018-05-13 NOTE — Progress Notes (Signed)
Virtual Visit via Telephone Note  I connected with Nathan Ramirez on 05/13/18 at  4:00 PM EDT by telephone and verified that I am speaking with the correct person using two identifiers.   I discussed the limitations, risks, security and privacy concerns of performing an evaluation and management service by telephone and the availability of in person appointments. I also discussed with the patient that there may be a patient responsible charge related to this service. The patient expressed understanding and agreed to proceed.   History of Present Illness:  73 year old gentleman with a history of former tobacco, allergic rhinitis (formerly on immunotherapy) GERD and clinical asthma.  Pt of Dr. Lamonte Sakai   Patient consented to consult via telephone: Yes People present and their role in pt care: Patient  Chief complaint: Increased rescue inhaler use, increased shortness of breath Worsened sob over this season    73 year old male former smoker (quit 1990, 40-pack-year smoking history) completing a telephonic visit with our office today because he is reporting that over the last 1 to 2 weeks has had increased shortness of breath.  Over the last week he is used his rescue inhaler 8 times daily.  Patient has previously been tried on Symbicort 160 as well as Dulera 200 both inhalers did not improve respiratory symptoms.  Patient had normal pulmonary function testing in the past.  Is felt that he may not need a maintenance inhaler and could be maintained on a rescue inhaler to use as needed.  Patient reports he continues to take his Claritin.  He has had increased wheezing with his shortness of breath over the last week.  Patient also reporting that 6 weeks ago he went to an urgent care at [facility] where he was diagnosed with pneumonia and treated with Augmentin as well as prednisone.  Patient believes he had a chest x-ray performed in urgent care.  There are no chest x-rays and the PACS system or in  epic to see.  Patient reports that he has had allergy work-up in the past and he reports he is "allergic to everything outside" with elevations to trees, pine trees, as well as grass.   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 Patient reports he typically has allergies as well as breathing problems in the spring as well as in the fall Patient reports that he is "allergic to everything is outside" he specifically lists trees, pine trees as well as assess Patient has tried Symbicort 160 as well as Dulera 200 in the past but has not noticed any improvement Patient reporting shortness of breath as well as wheezing over the past 3 to 4 weeks with over the last week pain significantly worsened Patient using his rescue inhaler about 8 times a day  Plan: Prednisone today We will have patient trial Breo Ellipta 200, sample provided Patient spouse to pick up Breo Ellipta 200 sample on 05/14/2018 Continue Claritin Can continue use Flonase as needed for nasal congestion 4-week follow-up in our office with Wyn Quaker, FNP Consider repeat lab work at 4-week follow-up: IgE, CBC with differential, potential FeNO    Follow Up Instructions:  Return in about 4 weeks (around 06/10/2018), or if symptoms worsen or fail to improve, for Follow up  with Wyn Quaker FNP-C.    I discussed the assessment and treatment plan with the patient. The patient was provided an opportunity to ask questions and all were answered. The patient agreed with the plan and demonstrated an understanding of the instructions.   The patient was advised to call back or  seek an in-person evaluation if the symptoms worsen or if the condition fails to improve as anticipated.  I provided 23 minutes of non-face-to-face time during this encounter.   Lauraine Rinne, NP

## 2018-05-13 NOTE — Assessment & Plan Note (Signed)
Assessment: 2017 blood work shows peripheral eosinophilia Patient reports he typically has allergies as well as breathing problems in the spring as well as in the fall Patient reports that he is "allergic to everything is outside" he specifically lists trees, pine trees as well as assess Patient has tried Symbicort 160 as well as Dulera 200 in the past but has not noticed any improvement Patient reporting shortness of breath as well as wheezing over the past 3 to 4 weeks with over the last week pain significantly worsened Patient using his rescue inhaler about 8 times a day  Plan: Prednisone today We will have patient trial Breo Ellipta 200, sample provided Patient spouse to pick up Breo Ellipta 200 sample on 05/14/2018 Continue Claritin Can continue use Flonase as needed for nasal congestion 4-week follow-up in our office with Nathan Quaker, FNP Consider repeat lab work at 4-week follow-up: IgE, CBC with differential, potential FeNO

## 2018-05-13 NOTE — Patient Instructions (Addendum)
Prednisone 10mg  tablet  >>>Take 2 tablets (20 mg total) daily for the next 5 days, then start 10mg  daily for 5 days, then stop >>>take with food  >>>take in the morning   Sample of Breo Ellipta 200 >>> Take 1 puff daily in the morning right when you wake up >>>Rinse your mouth out after use >>>This is a daily maintenance inhaler, NOT a rescue inhaler >>>Contact our office if you are having difficulties affording or obtaining this medication >>>It is important for you to be able to take this daily and not miss any doses    Only use your albuterol as a rescue medication to be used if you can't catch your breath by resting or doing a relaxed purse lip breathing pattern.  - The less you use it, the better it will work when you need it. - Ok to use up to 2 puffs  every 4 hours if you must but call for immediate appointment if use goes up over your usual need - Don't leave home without it !!  (think of it like the spare tire for your car)   Continue Claritin daily  Continue Flonase as needed    Return in about 4 weeks (around 06/10/2018), or if symptoms worsen or fail to improve, for Follow up with Wyn Quaker FNP-C.  Coronavirus (COVID-19) Are you at risk?  Are you at risk for the Coronavirus (COVID-19)?  To be considered HIGH RISK for Coronavirus (COVID-19), you have to meet the following criteria:  . Traveled to [place], [place], [place], [place] or [place]; or in the [place] to [place], [place], [place], or [place]; and have fever, cough, and shortness of breath within the last 2 weeks of travel OR . Been in close contact with a person diagnosed with COVID-19 within the last 2 weeks and have fever, cough, and shortness of breath . IF YOU DO NOT MEET THESE CRITERIA, YOU ARE CONSIDERED LOW RISK FOR COVID-19.  What to do if you are HIGH RISK for COVID-19?  Marland Kitchen If you are having a medical emergency, call 911. . Seek medical care right away. Before you go to a doctor's office,  urgent care or emergency department, call ahead and tell them about your recent travel, contact with someone diagnosed with COVID-19, and your symptoms. You should receive instructions from your physician's office regarding next steps of care.  . When you arrive at healthcare provider, tell the healthcare staff immediately you have returned from visiting [place], [place], [place], [place] or [place]; or traveled in the [place] to [place], [place], [place], or [place]; in the last two weeks or you have been in close contact with a person diagnosed with COVID-19 in the last 2 weeks.   . Tell the health care staff about your symptoms: fever, cough and shortness of breath. . After you have been seen by a medical provider, you will be either: o Tested for (COVID-19) and discharged home on quarantine except to seek medical care if symptoms worsen, and asked to  - Stay home and avoid contact with others until you get your results (4-5 days)  - Avoid travel on public transportation if possible (such as bus, train, or airplane) or o Sent to the Emergency Department by EMS for evaluation, COVID-19 testing, and possible admission depending on your condition and test results.  What to do if you are LOW RISK for COVID-19?  Reduce your risk of any infection by using the same  precautions used for avoiding the common cold or flu:  Marland Kitchen Wash your hands often with soap and warm water for at least 20 seconds.  If soap and water are not readily available, use an alcohol-based hand sanitizer with at least 60% alcohol.  . If coughing or sneezing, cover your mouth and nose by coughing or sneezing into the elbow areas of your shirt or coat, into a tissue or into your sleeve (not your hands). . Avoid shaking hands with others and consider head nods or verbal greetings only. . Avoid touching your eyes, nose, or mouth with unwashed hands.  . Avoid close contact with people who are sick. . Avoid places or events with  large numbers of people in one location, like concerts or sporting events. . Carefully consider travel plans you have or are making. . If you are planning any travel outside or inside the [place], visit the CDC's Travelers' Health webpage for the latest health notices. . If you have some symptoms but not all symptoms, continue to monitor at home and seek medical attention if your symptoms worsen. . If you are having a medical emergency, call 911.   [place] / e-Visit: [email or web address]         [facility]: [facility]: [phone]                   [facility]: [phone]           It is flu season:   >>> Best ways to protect herself from the flu: Receive the yearly flu vaccine, practice good hand hygiene washing with soap and also using hand sanitizer when available, eat a nutritious meals, get adequate rest, hydrate appropriately   Please contact the office if your symptoms worsen or you have concerns that you are not improving.   Thank you for choosing [facility] for your healthcare, and for allowing [place] to partner with you on your healthcare journey. I am thankful to be able to provide care to you today.   Wyn Quaker FNP-C

## 2018-05-24 ENCOUNTER — Telehealth: Payer: Self-pay | Admitting: Emergency Medicine

## 2018-05-24 MED ORDER — FLUTICASONE FUROATE-VILANTEROL 200-25 MCG/INH IN AEPB: 1.0000 | INHALATION_SPRAY | Freq: Every day | RESPIRATORY_TRACT | 1 refills | Status: AC

## 2018-05-24 NOTE — Telephone Encounter (Signed)
GSK for you can definitely be appropriate for patients on Medicare.  If he is out of the medication today please offer him a sample as indicated in initial office visit.  Patient needs to contact insurance company to find formulary.    Patient also may need a prior authorization or a formulary request change as he has tried multiple other inhalers which he was intolerant of and has found success with Breo Ellipta.  Patient and patient's spouse will need to contact insurance company and see what else can be done in order to help with cost of Breo Ellipta.  As also instructed at previous note please contact [place] rep to see if they have any other ideas of how to best support the patient.  Wyn Quaker FNP

## 2018-05-24 NOTE — Telephone Encounter (Signed)
More information needs to be researched for the patient.    Why is Breo costing $150?  Has the patient contacted his insurance company to discuss this? Is Adair Patter on formulary for the patient? Does the patient have a high deductible plan which is why $150 a month to receive the [place]?  While patient is researching this information I would recommend that we give the patient a months worth of Breo Ellipta 200.  If were able to supply this from our sample stock.  Also need to include paperwork for [place] for you, patient assistance program to see if patient may qualify for free Breo Ellipta.  Can also follow-up with Breo Ellipta rep to see if they can help with getting this patient's cost down.  I will route this message back to triage to further evaluate.  Routing to [facility] as Juluis Rainier so we can keep an eye on the patient and follow-up accordingly.  Wyn Quaker FNP

## 2018-05-24 NOTE — Telephone Encounter (Signed)
Returned call advised spouse she is upset b/c Memory Dance was really beneficial. She wants to know if there is anything else that can be sent to try. Memory Dance will cost $150 per month with his insurance.   Pt has tried and failed Advair/Symbicort/Dulera

## 2018-05-24 NOTE — Telephone Encounter (Signed)
Returned call and made spouse aware rx Breo 200 has been sent Nothing further needed.

## 2018-05-24 NOTE — Telephone Encounter (Signed)
That is cost left for patient after his medicare pays. There is NO assistance for patient's on Medicare. He is out of med as of now today.  I did offer to give patient assistance paperwork. Spouse not sure if they would qualify.

## 2018-05-25 NOTE — Telephone Encounter (Signed)
Patients wife is returning phone call.  Phone number is [phone].

## 2018-05-25 NOTE — Telephone Encounter (Signed)
Called and spoke with patient regarding Memory Dance is too expensive monthly Offered pt the pt asst program; he agreed to have this mailed to him Printed the forms and placed in mail to the pt today Pt verbalized understanding, had no further concerns Closing encounter, will open new encounter when forms arrive back to office Nothing further needed at this time.

## 2018-05-25 NOTE — Telephone Encounter (Signed)
ATC pt, no answer. Left message for pt to call back.  

## 2018-06-13 NOTE — Progress Notes (Signed)
@Patient  ID: [name], male    DOB: April 29, 1945, 73 y.o.   MRN: [id number]  Chief Complaint  Patient presents with  . Follow-up    ran out of Breo sample 1 week ago but feels he's doing better anyway     Referring provider: Crecencio Mc, MD  HPI:  73 year old male former smoker followed in our office for allergic rhinitis and asthma  PMH: GERD Smoker/ Smoking History: Former smoker.  40-pack-year smoker.  Quit 1990. Maintenance:  Breo Ellipta 200 Pt of: Dr. Lamonte Sakai  06/14/2018  - Visit   73 year old male former smoker followed in our office for asthma.  Patient was last treated telephonically 4 weeks ago for an asthma exacerbation.  Patient reports that he feels much better.  Patient was also given a trial of Brio Ellipta 200.  Patient felt like this did help with his breathing but he ran out of that medication has not been on it for the last week.  He is only been managed on Ventolin.  He says that he uses this twice daily and he feels that his symptoms are well managed.  Patient has concerns about being on maintenance inhalers as they typically cost a lot of money for him.  Previous blood work from 2017 shows peripheral eosinophilia.  Patient reported at last visit that he typically has allergies to trees as well as grasses.  Patient still works third shift doing maintenance.     Tests:   10/22/2015- CBC with differential- eosinophils relative 3.1, eosinophils absolute 0.3  04/10/2016-pulmonary function test- FVC 3.98 (88% predicted), postbronchodilator ratio 80, postbronchodilator FEV1 3.18 (96% predicted), no bronchodilator response, DLCO 87  FENO:  No results found for: NITRICOXIDE  PFT: PFT Results Latest Ref Rng & Units 04/10/2016  FVC-Pre L 3.98  FVC-Predicted Pre % 88  FVC-Post L 3.99  FVC-Predicted Post % 89  Pre FEV1/FVC % % 78  Post FEV1/FCV % % 80  FEV1-Pre L 3.11  FEV1-Predicted Pre % 94  FEV1-Post L 3.18  DLCO UNC% % 87  DLCO COR %Predicted % 95  TLC  L 6.54  TLC % Predicted % 93  RV % Predicted % 99    Imaging: No results found.    Specialty Problems      Pulmonary Problems   Allergic rhinitis    Qualifier: Diagnosis of  By: Wynetta Emery MD, [place]        Asthma    Qualifier: Diagnosis of  By: Wynetta Emery MD, [place]           Allergies  Allergen Reactions  . Tamiflu [Oseltamivir Phosphate] Nausea And Vomiting and Other (See Comments)    Severe Headache  . Acitretin Rash    Immunization History  Administered Date(s) Administered  . Influenza Split 09/17/2011, 09/16/2014  . Influenza, High Dose Seasonal PF 10/23/2015, 09/26/2016, 09/24/2017  . Influenza,inj,Quad PF,6+ Mos 09/30/2012  . Influenza-Unspecified 10/13/2013  . Pneumococcal Conjugate-13 03/28/2014  . Pneumococcal Polysaccharide-23 10/11/2012  . Tdap 09/17/2012  . Zoster 10/17/2011  . Zoster Recombinat (Shingrix) 11/02/2017, 01/09/2018    Past Medical History:  Diagnosis Date  . Allergy   . Arthritis   . Asthma    "allergic"  . Carcinoma, basal cell, skin 2015   thigh  . Chicken pox   . Colon polyp 2011?   pathology unknown  . Elevated blood pressure reading   . GERD (gastroesophageal reflux disease)    never had EGD  . Headache(784.0)    occasional tension  .  Hypercholesteremia    borderline  . PONV (postoperative nausea and vomiting)     Tobacco History: Social History   Tobacco Use  Smoking Status Former Smoker  . Packs/day: 1.00  . Years: 40.00  . Pack years: 40.00  . Types: Cigarettes  . Last attempt to quit: 01/14/1988  . Years since quitting: 30.4  Smokeless Tobacco Former Air traffic controller given: Yes  Continue to not smoke  Outpatient Encounter Medications as of 06/14/2018  Medication Sig  . albuterol (VENTOLIN HFA) 108 (90 Base) MCG/ACT inhaler INHALE 2 PUFFS INTO LUNGS EVERY 6 HOURS AS NEEDED FOR WHEEZING/SHORTNESS OF BREATH  . aspirin 81 MG tablet Take 81 mg by mouth daily.  . Cholecalciferol (VITAMIN D PO) Take by  mouth daily.  . clobetasol ointment (TEMOVATE) 0.05 % Apply twice daily to raised itchy areas until smooth  . doxylamine, Sleep, (UNISOM) 25 MG tablet Take 25 mg by mouth at bedtime as needed.  . fluticasone (FLONASE) 50 MCG/ACT nasal spray SPRAY 2 SPRAYS INTO EACH NOSTRIL EVERY DAY  . folic acid (FOLVITE) 1 MG tablet Take 1 mg by mouth daily.  Marland Kitchen loratadine (CLARITIN) 10 MG tablet TAKE 1 TABLET (10 MG TOTAL) BY MOUTH DAILY.  . Magnesium 100 MG CAPS Take 1 capsule by mouth daily.  . Melatonin 10 MG TABS Take 10 mg by mouth.  . meloxicam (MOBIC) 7.5 MG tablet Take 7.5 mg by mouth 2 (two) times daily as needed.  . mometasone-formoterol (DULERA) 200-5 MCG/ACT AERO Inhale 2 puffs into the lungs 2 (two) times daily.  . Multiple Vitamins-Minerals (MULTI FOR HIM 50+) TABS Take 1 tablet by mouth daily.  . Omega-3 Fatty Acids (FISH OIL PO) Take by mouth.  . Red Yeast Rice Extract 600 MG CAPS Take 1 capsule by mouth 2 (two) times daily.  . Secukinumab 150 MG/ML SOAJ INJECT THE CONTENTS OF 2 PENS (300 MG) UNDER THE SKIN EVERY 4 WEEKS  . [DISCONTINUED] predniSONE (DELTASONE) 10 MG tablet Take 2 tablets (20mg  total) daily for the next 5 days, then 1 tablet daily for 5 days. Take in the AM with food.  . fluticasone furoate-vilanterol (BREO ELLIPTA) 200-25 MCG/INH AEPB Inhale 1 puff into the lungs daily. (Patient not taking: Reported on 06/14/2018)  . urea (CARMOL) 40 % CREA Apply 1-2x daily to the hands/feet to decrease scaling   No facility-administered encounter medications on file as of 06/14/2018.      Review of Systems  Review of Systems  Constitutional: Negative for activity change, chills, fatigue, fever and unexpected weight change.  HENT: Negative for congestion, postnasal drip, rhinorrhea, sinus pressure, sinus pain, sneezing and sore throat.   Eyes: Negative.   Respiratory: Negative for cough, shortness of breath and wheezing.   Cardiovascular: Negative for chest pain and palpitations.   Gastrointestinal: Negative for diarrhea, nausea and vomiting.       Denies acid reflux  Endocrine: Negative.   Musculoskeletal: Negative.   Skin: Negative.   Allergic/Immunologic: Positive for environmental allergies.  Neurological: Negative for dizziness and headaches.  Psychiatric/Behavioral: Negative.  Negative for dysphoric mood. The patient is not nervous/anxious.   All other systems reviewed and are negative.    Physical Exam  BP 130/82   Pulse 94   Temp 97.7 F (36.5 C)   Ht 5\' 10"  (1.778 m)   Wt 228 lb 3.2 oz (103.5 kg)   SpO2 95%   BMI 32.74 kg/m   Wt Readings from Last 5 Encounters:  06/14/18 228 lb 3.2  oz (103.5 kg)  12/16/17 228 lb 9.6 oz (103.7 kg)  11/04/17 226 lb 9.6 oz (102.8 kg)  10/07/17 222 lb (100.7 kg)  12/29/16 227 lb 12.8 oz (103.3 kg)     Physical Exam  Constitutional: He is oriented to person, place, and time and well-developed, well-nourished, and in no distress. No distress.  HENT:  Head: Normocephalic and atraumatic.  Right Ear: Hearing, tympanic membrane, external ear and ear canal normal.  Left Ear: Hearing, tympanic membrane, external ear and ear canal normal.  Mouth/Throat: Uvula is midline and oropharynx is clear and moist. No oropharyngeal exudate.  Eyes: Pupils are equal, round, and reactive to light.  Neck: Normal range of motion. Neck supple.  Cardiovascular: Normal rate, regular rhythm and normal heart sounds.  Pulmonary/Chest: Effort normal and breath sounds normal. No accessory muscle usage. No respiratory distress. He has no decreased breath sounds. He has no wheezes. He has no rhonchi. He has no rales.  Abdominal: Soft. Bowel sounds are normal. He exhibits no distension. There is no abdominal tenderness.  Musculoskeletal: Normal range of motion.        General: No edema.  Lymphadenopathy:    He has no cervical adenopathy.  Neurological: He is alert and oriented to person, place, and time. Gait normal.  Skin: Skin is warm and  dry. He is not diaphoretic.  Bruising on upper arms from recent prednisone taper   Psychiatric: Mood, memory, affect and judgment normal.  Nursing note and vitals reviewed.     Lab Results:  CBC    Component Value Date/Time   WBC 9.1 11/12/2016   WBC 9.2 10/22/2015 1828   RBC 4.54 10/22/2015 1828   HGB 15.0 11/12/2016   HCT 45 11/12/2016   PLT 258 11/12/2016   MCV 96.9 10/22/2015 1828   MCH 32.1 12/07/2012 0715   MCHC 33.9 10/22/2015 1828   RDW 14.0 10/22/2015 1828   LYMPHSABS 2.3 10/22/2015 1828   MONOABS 1.0 10/22/2015 1828   EOSABS 0.3 10/22/2015 1828   BASOSABS 0.1 10/22/2015 1828    BMET    Component Value Date/Time   NA 139 12/25/2016 1000   NA 139 11/12/2016   K 4.2 12/25/2016 1000   CL 105 12/25/2016 1000   CO2 29 12/25/2016 1000   GLUCOSE 98 12/25/2016 1000   BUN 12 12/25/2016 1000   BUN 15 11/12/2016   CREATININE 0.98 12/25/2016 1000   CALCIUM 9.1 12/25/2016 1000   GFRNONAA 85 (L) 12/08/2012 0610   GFRAA >90 12/08/2012 0610    BNP No results found for: BNP  ProBNP No results found for: PROBNP    Assessment & Plan:   Asthma Assessment: 2017 blood work shows peripheral eosinophilia Patient reports that he typically has allergies as well as breathing problems in the spring as well as in the fall Patient reports that he specifically is allergic to trees, pine trees Patient has been tried on Dulera 200, Symbicort 160 as well as Breo Ellipta 200, patient found that Breo Ellipta 200 was the most effective Patient has not been on any maintenance inhalers for the last week.  He has simply been managed on Ventolin twice daily.  He thinks that this is comparable to the maintenance inhalers. Patient reports he only needs to use his rescue inhaler twice a day and that is simply scheduled not because he is symptomatic Lung sounds are clear to auscultation today  Plan: Continue Ventolin as needed for management of asthma We will hold off on maintenance  inhalers at this time If patient does start to have an exacerbation he may be a candidate for Symbicort as needed use based off of recent GI guidelines Continue Claritin daily Can continue to use Flonase as needed for nasal congestion 60-month follow-up with Dr. Lamonte Sakai or Wyn Quaker FNP We will hold off on lab work today as patient has clinically improved significantly.  I do not believe that biologic medications are needed at this time   Allergic rhinitis Plan: Continue Claritin Continue fluticasone nasal spray as needed  GERD (gastroesophageal reflux disease) Plan: Continue lansoprazole    Return in about 9 months (around 03/14/2019), or if symptoms worsen or fail to improve, for Follow up with Wyn Quaker FNP-C, Follow up with Dr. Lamonte Sakai.   Lauraine Rinne, NP 06/14/2018   This appointment was 18 minutes long with over 50% of the time in direct face-to-face patient care, assessment, plan of care, and follow-up.

## 2018-06-14 ENCOUNTER — Other Ambulatory Visit: Payer: Self-pay

## 2018-06-14 ENCOUNTER — Ambulatory Visit (INDEPENDENT_AMBULATORY_CARE_PROVIDER_SITE_OTHER): Payer: Medicare Other | Admitting: Pulmonary Disease

## 2018-06-14 ENCOUNTER — Encounter: Payer: Self-pay | Admitting: Pulmonary Disease

## 2018-06-14 VITALS — BP 130/82 | HR 94 | Temp 97.7°F | Ht 70.0 in | Wt 228.2 lb

## 2018-06-14 DIAGNOSIS — J4530 Mild persistent asthma, uncomplicated: Secondary | ICD-10-CM | POA: Diagnosis not present

## 2018-06-14 DIAGNOSIS — K219 Gastro-esophageal reflux disease without esophagitis: Secondary | ICD-10-CM | POA: Diagnosis not present

## 2018-06-14 DIAGNOSIS — J301 Allergic rhinitis due to pollen: Secondary | ICD-10-CM | POA: Diagnosis not present

## 2018-06-14 NOTE — Assessment & Plan Note (Signed)
Assessment: 2017 blood work shows peripheral eosinophilia Patient reports that he typically has allergies as well as breathing problems in the spring as well as in the fall Patient reports that he specifically is allergic to trees, pine trees Patient has been tried on Dulera 200, Symbicort 160 as well as Breo Ellipta 200, patient found that Breo Ellipta 200 was the most effective Patient has not been on any maintenance inhalers for the last week.  He has simply been managed on Ventolin twice daily.  He thinks that this is comparable to the maintenance inhalers. Patient reports he only needs to use his rescue inhaler twice a day and that is simply scheduled not because he is symptomatic Lung sounds are clear to auscultation today  Plan: Continue Ventolin as needed for management of asthma We will hold off on maintenance inhalers at this time If patient does start to have an exacerbation he may be a candidate for Symbicort as needed use based off of recent GI guidelines Continue Claritin daily Can continue to use Flonase as needed for nasal congestion 72-month follow-up with Dr. Lamonte Sakai or Wyn Quaker FNP We will hold off on lab work today as patient has clinically improved significantly.  I do not believe that biologic medications are needed at this time

## 2018-06-14 NOTE — Assessment & Plan Note (Signed)
Plan: Continue lansoprazole

## 2018-06-14 NOTE — Patient Instructions (Addendum)
Only use your albuterol as a rescue medication to be used if you can't catch your breath by resting or doing a relaxed purse lip breathing pattern.  - The less you use it, the better it will work when you need it. - Ok to use up to 2 puffs  every 4 hours if you must but call for immediate appointment if use goes up over your usual need - Don't leave home without it !!  (think of it like the spare tire for your car)   Continue claritin daily   Continue flonase 1 spray each nostril as needed for nasal congestion    Return in about 9 months (around 03/14/2019), or if symptoms worsen or fail to improve, for Follow up with Wyn Quaker FNP-C, Follow up with Dr. Lamonte Sakai.   Coronavirus (COVID-19) Are you at risk?  Are you at risk for the Coronavirus (COVID-19)?  To be considered HIGH RISK for Coronavirus (COVID-19), you have to meet the following criteria:  . Traveled to [place], [place], [place], [place] or [place]; or in the [place] to [place], [place], [place], or [place]; and have fever, cough, and shortness of breath within the last 2 weeks of travel OR . Been in close contact with a person diagnosed with COVID-19 within the last 2 weeks and have fever, cough, and shortness of breath . IF YOU DO NOT MEET THESE CRITERIA, YOU ARE CONSIDERED LOW RISK FOR COVID-19.  What to do if you are HIGH RISK for COVID-19?  Marland Kitchen If you are having a medical emergency, call 911. . Seek medical care right away. Before you go to a doctor's office, urgent care or emergency department, call ahead and tell them about your recent travel, contact with someone diagnosed with COVID-19, and your symptoms. You should receive instructions from your physician's office regarding next steps of care.  . When you arrive at healthcare provider, tell the healthcare staff immediately you have returned from visiting [place], [place], [place], [place] or [place]; or traveled in the [place] to [place], [place], [place], or [place]; in the last two weeks or you have been in close contact with a person diagnosed with COVID-19 in the last 2 weeks.   . Tell the health care staff about your symptoms: fever, cough and shortness of breath. . After you have been seen by a medical provider, you will be either: o Tested for (COVID-19) and discharged home on quarantine except to seek medical care if symptoms worsen, and asked to  - Stay home and avoid contact with others until you get your results (4-5 days)  - Avoid travel on public transportation if possible (such as bus, train, or airplane) or o Sent to the Emergency Department by EMS for evaluation, COVID-19 testing, and possible admission depending on your condition and test results.  What to do if you are LOW RISK for COVID-19?  Reduce your risk of any infection by using the same precautions used for avoiding the common cold or flu:  Marland Kitchen Wash your hands often with soap and warm water for at least 20 seconds.  If soap and water are not readily available, use an alcohol-based hand sanitizer with at least 60% alcohol.  . If coughing or sneezing, cover your mouth and nose by coughing or sneezing into the elbow areas of your shirt or coat, into a tissue or into your sleeve (not your hands). . Avoid shaking hands with others and consider head nods  or verbal greetings only. . Avoid touching your eyes, nose, or mouth with unwashed hands.  . Avoid close contact with people who are sick. . Avoid places or events with large numbers of people in one location, like concerts or sporting events. . Carefully consider travel plans you have or are making. . If you are planning any travel outside or inside the [place], visit the CDC's Travelers' Health webpage for the latest health notices. . If you have some symptoms but not all symptoms, continue to monitor at home and seek medical attention if your symptoms worsen. . If you are having a medical emergency, call  911.   [place] / e-Visit: [email or web address]         [facility]: [facility]: [phone]                   [facility]: [phone]           It is flu season:   >>> Best ways to protect herself from the flu: Receive the yearly flu vaccine, practice good hand hygiene washing with soap and also using hand sanitizer when available, eat a nutritious meals, get adequate rest, hydrate appropriately   Please contact the office if your symptoms worsen or you have concerns that you are not improving.   Thank you for choosing [facility] for your healthcare, and for allowing [place] to partner with you on your healthcare journey. I am thankful to be able to provide care to you today.   Wyn Quaker FNP-C

## 2018-06-14 NOTE — Assessment & Plan Note (Signed)
Plan: Continue Claritin Continue fluticasone nasal spray as needed

## 2018-08-09 ENCOUNTER — Telehealth: Payer: Self-pay | Admitting: Emergency Medicine

## 2018-08-09 DIAGNOSIS — J4530 Mild persistent asthma, uncomplicated: Secondary | ICD-10-CM

## 2018-08-09 NOTE — Telephone Encounter (Signed)
Call returned to patient, his wife states they are moving next week and they need a referral placed to a new pulmonologist. She states if they are unhappy with the new pulmonologist they are willing to travel the 3 hours to see RB. I made here aware I would send message to [place]. Voiced understanding. (order has been pended)  RB please advise if okay to place referral for new pulmonologist.

## 2018-08-09 NOTE — Telephone Encounter (Signed)
Ok to refer him to Dr Scherrie Gerlach. Dx is mild intermittent asthma, hx immunosuppression.

## 2018-08-09 NOTE — Telephone Encounter (Signed)
Referral was placed  LMTCB for the spouse

## 2018-08-09 NOTE — Telephone Encounter (Signed)
LMTCB

## 2018-08-09 NOTE — Telephone Encounter (Signed)
Patient is returning phone call.  Patient needs referral to Dr. Lulu Riding [place], [place].  Fax number is [phone]. Phone number is [phone]. Patient phone number is [phone].

## 2018-08-10 NOTE — Telephone Encounter (Signed)
Spoke with pt's wife. She is aware that this referral has been made. Nothing further was needed at this time.

## 2018-08-11 ENCOUNTER — Telehealth: Payer: Self-pay | Admitting: Emergency Medicine

## 2018-08-11 MED ORDER — ALBUTEROL SULFATE HFA 108 (90 BASE) MCG/ACT IN AERS
INHALATION_SPRAY | RESPIRATORY_TRACT | 6 refills | Status: AC
Start: 2018-08-11 — End: ?

## 2018-08-11 NOTE — Addendum Note (Signed)
Addended by: Annie Paras D on: 08/11/2018 02:59 PM   Modules accepted: Orders

## 2018-08-11 NOTE — Telephone Encounter (Addendum)
Called and spoke to pt wife, Nathan Ramirez (on [place]). Nathan Ramirez states she and pt are moving out of the area soon and pt will be switching to another pulmonary doctor. Nathan Ramirez was inquiring if [facility] could refill his albuterol rescue inhaler in the meantime.   Pt last seen 06/14/2018 by BPM. In BPM's notes, he suggested pt use his rescue inhaler as needed, therefore, I let Nathan Ramirez know we could get this refilled for him. I [facility] [place] [place] and have sent the order.  Nathan Ramirez expressed understanding with no additional questions.  For Dr. Lamonte Sakai:  Nathan Ramirez would like to mention that she doesn't want pt to be "archived" because she and pt have thoroughly enjoyed and appreciated RB's care. She further notes "if things do not work out" with the other provider, she and pt would be willing to drive three hours to see RB.   Order has been placed. Routing to RB as an [place]. Nothing further needed at this time.

## 2018-08-11 NOTE — Telephone Encounter (Signed)
Pt wife calling to check on the status of refill for albuterol they can be reached @ [phone].Hillery Hunter

## 2018-08-16 ENCOUNTER — Other Ambulatory Visit: Payer: Self-pay

## 2018-08-25 IMAGING — CR DG CHEST 2V
2 series · 2 of 2 positions shown · non-contrast
Comparison: November 24, 2012

CLINICAL DATA: Cough and shortness of breath

EXAM:
CHEST  2 VIEW

[chest pa]
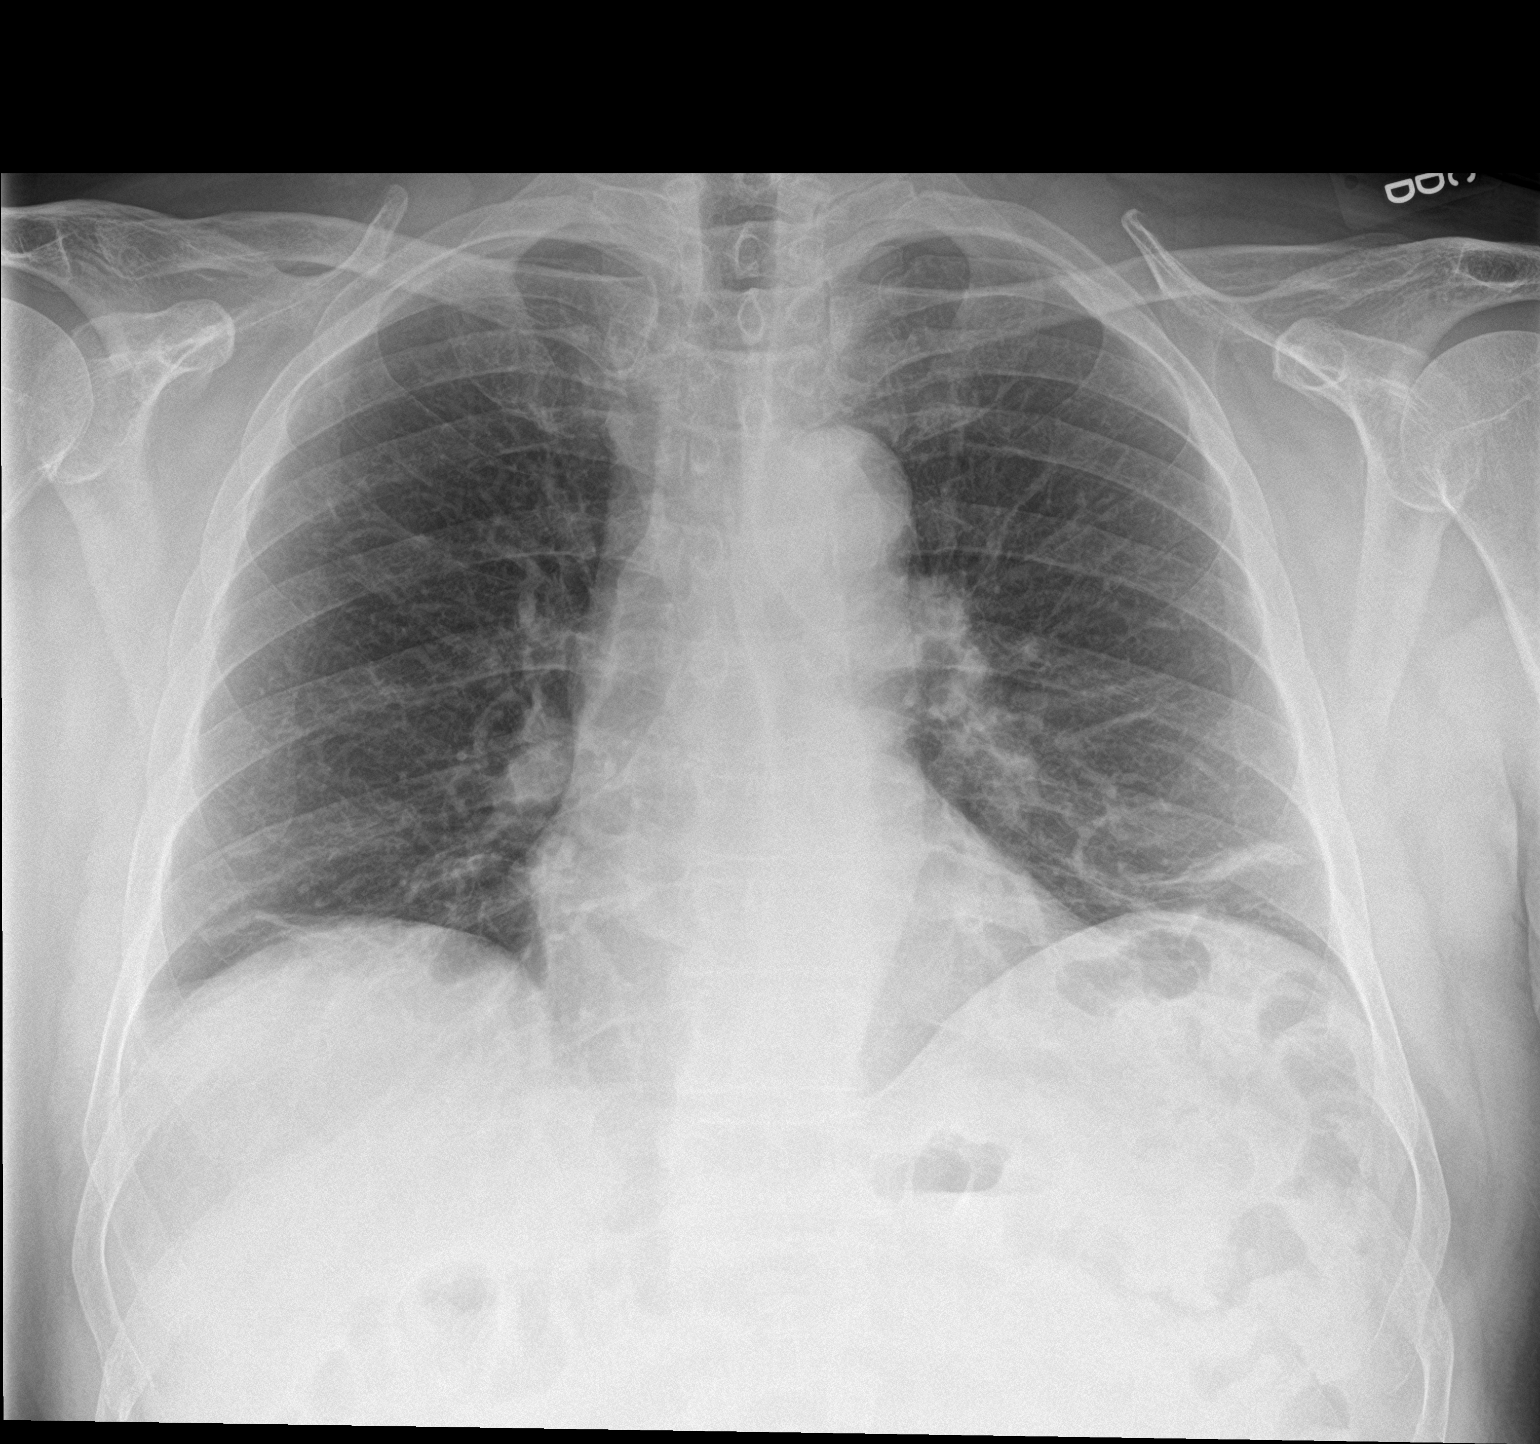

[chest lat]
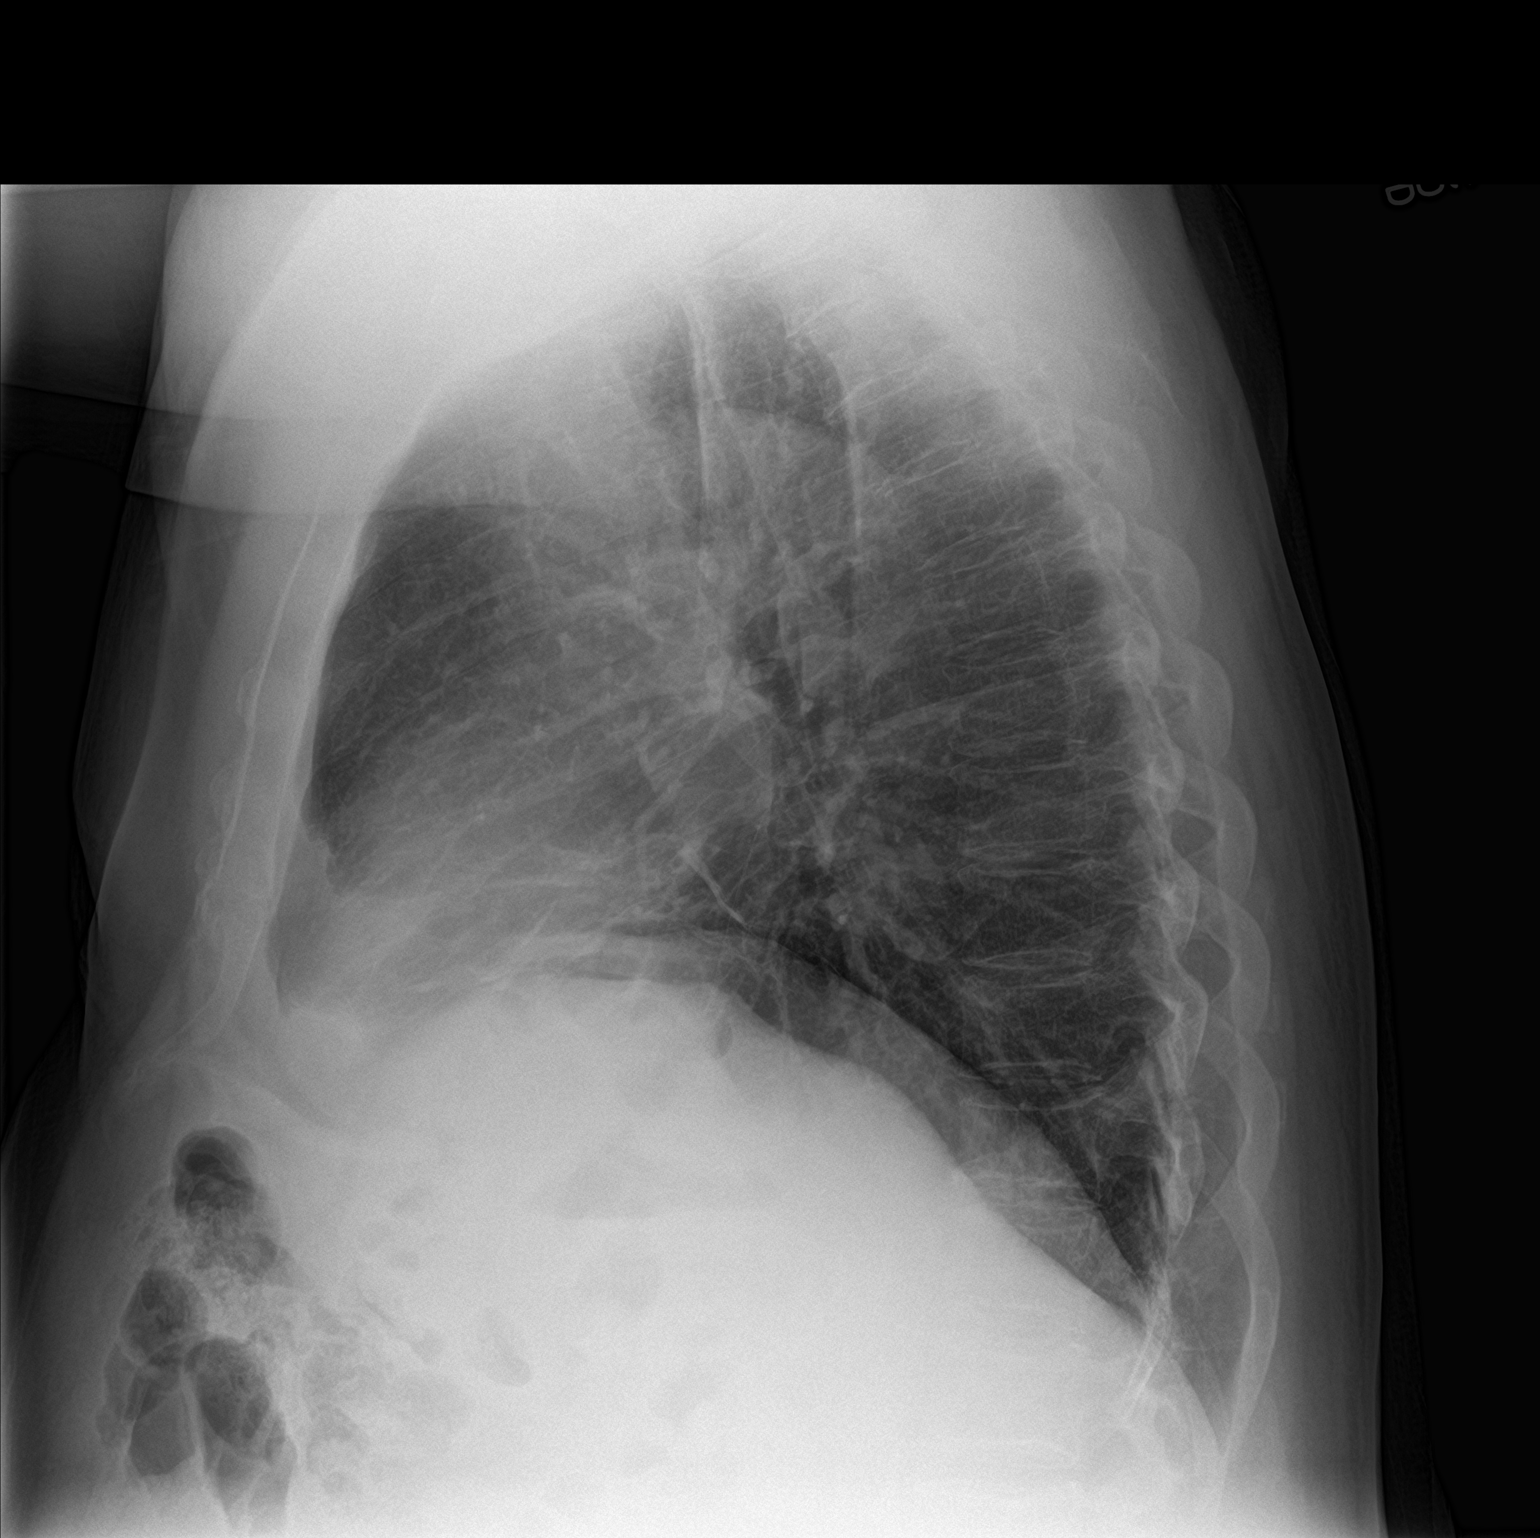

[2 of 2 positions shown; findings below may reference images not displayed]

FINDINGS: There is scarring bases. There is increased opacity in the left base
compared to most recent study, concerning for focal pneumonia
superimposed on scarring. Lungs elsewhere are clear. Heart size and
pulmonary vascularity are normal. No adenopathy. No bone lesions.
IMPRESSION: Bibasilar scarring. Suspect pneumonia superimposed on scarring in
the left base. Lungs elsewhere clear. Stable cardiac silhouette.

Followup PA and lateral chest radiographs recommended in 3-4 weeks
following trial of antibiotic therapy to ensure resolution and
exclude underlying malignancy.

## 2018-08-27 MED ORDER — FOLIC ACID 1 MG TABLET
ORAL_TABLET | 3 refills | 0.00000 days | Status: CP
Start: 2018-08-27 — End: ?

## 2018-08-27 NOTE — Unmapped (Signed)
Call from wife.  They are moving to [place].  Near [employer].   Wife would like a referral to a dermatologist in [place] that will continue the treatment plan patient is on.  Very happy with the way [facility] has helped patient.  Wants to continue this.

## 2018-08-31 NOTE — Unmapped (Signed)
Called wife back and gave information below per Dr. Dub Mikes:

## 2018-09-04 DIAGNOSIS — Z23 Encounter for immunization: Secondary | ICD-10-CM | POA: Diagnosis not present

## 2018-09-21 DIAGNOSIS — L409 Psoriasis, unspecified: Secondary | ICD-10-CM

## 2018-09-21 NOTE — Unmapped (Signed)
Call from wife on nurse line.  She called Dr. Susette Racer to schedule an appointment for patient but they require an actual referral to be made to them before appointment can be scheduled.  See contact information below for Dr. Susette Racer:

## 2018-09-22 NOTE — Unmapped (Signed)
Referral has been faxed and confirmation received

## 2018-10-11 NOTE — Unmapped (Signed)
Pt has an appt with Dr. Adline Mango 12-22-2018

## 2018-10-18 DIAGNOSIS — L409 Psoriasis, unspecified: Secondary | ICD-10-CM

## 2018-10-18 MED ORDER — METHOTREXATE SODIUM 2.5 MG TABLET
ORAL_TABLET | 1 refills | 0.00000 days | Status: CP
Start: 2018-10-18 — End: ?

## 2018-10-19 NOTE — Unmapped (Signed)
Dylan Leach needs a refill on MTX. His hands are flaring up and their next appointment is until December. [facility] [place] . Wife states he cant wait that long for relieve, please call or send something that will help him.  [facility]

## 2018-12-03 ENCOUNTER — Other Ambulatory Visit: Payer: Self-pay

## 2018-12-08 DIAGNOSIS — L309 Dermatitis, unspecified: Principal | ICD-10-CM

## 2018-12-08 MED ORDER — CLOBETASOL 0.05 % TOPICAL OINTMENT
OPHTHALMIC | 3 refills | 0.00000 days | Status: CP
Start: 2018-12-08 — End: ?

## 2018-12-08 NOTE — Unmapped (Signed)
Requesting refill on Clobetasol, Patient last seen in the office on 03/03/18. Please advise, Thank You!

## 2018-12-15 DIAGNOSIS — Z889 Allergy status to unspecified drugs, medicaments and biological substances status: Secondary | ICD-10-CM | POA: Diagnosis not present

## 2018-12-15 DIAGNOSIS — Z79899 Other long term (current) drug therapy: Secondary | ICD-10-CM | POA: Diagnosis not present

## 2018-12-15 DIAGNOSIS — J4530 Mild persistent asthma, uncomplicated: Secondary | ICD-10-CM | POA: Diagnosis not present

## 2018-12-15 DIAGNOSIS — F17211 Nicotine dependence, cigarettes, in remission: Secondary | ICD-10-CM | POA: Diagnosis not present

## 2018-12-15 DIAGNOSIS — L4050 Arthropathic psoriasis, unspecified: Secondary | ICD-10-CM | POA: Diagnosis not present

## 2018-12-22 DIAGNOSIS — L400 Psoriasis vulgaris: Secondary | ICD-10-CM | POA: Diagnosis not present

## 2019-02-02 DIAGNOSIS — L400 Psoriasis vulgaris: Secondary | ICD-10-CM | POA: Diagnosis not present

## 2019-03-07 DIAGNOSIS — Z79899 Other long term (current) drug therapy: Secondary | ICD-10-CM | POA: Diagnosis not present

## 2019-03-07 DIAGNOSIS — L400 Psoriasis vulgaris: Secondary | ICD-10-CM | POA: Diagnosis not present

## 2019-03-15 DIAGNOSIS — Z889 Allergy status to unspecified drugs, medicaments and biological substances status: Secondary | ICD-10-CM | POA: Diagnosis not present

## 2019-03-15 DIAGNOSIS — J4530 Mild persistent asthma, uncomplicated: Secondary | ICD-10-CM | POA: Diagnosis not present

## 2019-03-15 DIAGNOSIS — F17211 Nicotine dependence, cigarettes, in remission: Secondary | ICD-10-CM | POA: Diagnosis not present

## 2019-03-15 DIAGNOSIS — L4050 Arthropathic psoriasis, unspecified: Secondary | ICD-10-CM | POA: Diagnosis not present

## 2019-11-03 ENCOUNTER — Telehealth: Payer: Self-pay

## 2019-11-03 NOTE — Telephone Encounter (Signed)
Pt's wife called and said they moved over a year ago and she called last year to let us know. I removed Dr. Derrel Nip as his pcp and updated patient's address.
